# Patient Record
Sex: Female | Born: 1995
Health system: Southern US, Community
[De-identification: ages and names within clinical notes are randomized; demographics above are authoritative.]

## PROBLEM LIST (undated history)

## (undated) DIAGNOSIS — J45909 Unspecified asthma, uncomplicated: Secondary | ICD-10-CM

## (undated) DIAGNOSIS — F32A Depression, unspecified: Secondary | ICD-10-CM

## (undated) DIAGNOSIS — F419 Anxiety disorder, unspecified: Secondary | ICD-10-CM

## (undated) DIAGNOSIS — M549 Dorsalgia, unspecified: Secondary | ICD-10-CM

## (undated) DIAGNOSIS — E079 Disorder of thyroid, unspecified: Secondary | ICD-10-CM

## (undated) DIAGNOSIS — E890 Postprocedural hypothyroidism: Secondary | ICD-10-CM

## (undated) DIAGNOSIS — F410 Panic disorder [episodic paroxysmal anxiety] without agoraphobia: Secondary | ICD-10-CM

## (undated) HISTORY — DX: Depression, unspecified: F32.A

## (undated) HISTORY — DX: Postprocedural hypothyroidism: E89.0

## (undated) HISTORY — PX: THYROIDECTOMY: SHX17

## (undated) HISTORY — DX: Anxiety disorder, unspecified: F41.9

---
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
## 2015-05-06 ENCOUNTER — Encounter (HOSPITAL_BASED_OUTPATIENT_CLINIC_OR_DEPARTMENT_OTHER): Payer: Self-pay | Admitting: Emergency Medicine

## 2015-05-06 ENCOUNTER — Emergency Department (HOSPITAL_BASED_OUTPATIENT_CLINIC_OR_DEPARTMENT_OTHER)
Admission: EM | Admit: 2015-05-06 | Discharge: 2015-05-06 | Disposition: A | Discharge status: Home / Self Care | Payer: Medicaid Other | Attending: Physician Assistant | Admitting: Physician Assistant

## 2015-05-06 ENCOUNTER — Emergency Department (HOSPITAL_BASED_OUTPATIENT_CLINIC_OR_DEPARTMENT_OTHER): Payer: Medicaid Other

## 2015-05-06 DIAGNOSIS — Y9289 Other specified places as the place of occurrence of the external cause: Secondary | ICD-10-CM | POA: Diagnosis not present

## 2015-05-06 DIAGNOSIS — Z8659 Personal history of other mental and behavioral disorders: Secondary | ICD-10-CM | POA: Diagnosis not present

## 2015-05-06 DIAGNOSIS — Z792 Long term (current) use of antibiotics: Secondary | ICD-10-CM | POA: Diagnosis not present

## 2015-05-06 DIAGNOSIS — W010XXA Fall on same level from slipping, tripping and stumbling without subsequent striking against object, initial encounter: Secondary | ICD-10-CM | POA: Insufficient documentation

## 2015-05-06 DIAGNOSIS — Y9389 Activity, other specified: Secondary | ICD-10-CM | POA: Insufficient documentation

## 2015-05-06 DIAGNOSIS — Y998 Other external cause status: Secondary | ICD-10-CM | POA: Diagnosis not present

## 2015-05-06 DIAGNOSIS — M25561 Pain in right knee: Secondary | ICD-10-CM

## 2015-05-06 DIAGNOSIS — Z7951 Long term (current) use of inhaled steroids: Secondary | ICD-10-CM | POA: Diagnosis not present

## 2015-05-06 DIAGNOSIS — J45909 Unspecified asthma, uncomplicated: Secondary | ICD-10-CM | POA: Insufficient documentation

## 2015-05-06 DIAGNOSIS — S8991XA Unspecified injury of right lower leg, initial encounter: Secondary | ICD-10-CM | POA: Insufficient documentation

## 2015-05-06 DIAGNOSIS — Z79899 Other long term (current) drug therapy: Secondary | ICD-10-CM | POA: Insufficient documentation

## 2015-05-06 MED ORDER — IBUPROFEN 800 MG PO TABS
800.0000 mg | ORAL_TABLET | Freq: Once | ORAL | Status: AC
Start: 2015-05-06 — End: 2015-05-06
  Administered 2015-05-06: 800 mg via ORAL
  Filled 2015-05-06: qty 1

## 2015-05-06 MED ORDER — IBUPROFEN 800 MG PO TABS: 800.0000 mg | ORAL_TABLET | Freq: Three times a day (TID) | ORAL | Status: DC

## 2015-05-06 NOTE — ED Notes (Signed)
Patient in Xray

## 2015-05-06 NOTE — ED Provider Notes (Signed)
CSN: [id number]     Arrival date & time 05/06/15  2036 History  By signing my name below, I, Evelene Croon, attest that this documentation has been prepared under the direction and in the presence of Diago Haik Julio Alm, MD . Electronically Signed: Evelene Croon, Scribe. 05/06/2015. 9:34 PM.    Chief Complaint  Patient presents with  . Knee Pain    The history is provided by the patient. No language interpreter was used.     HPI Comments:  Neera Dogan is a 20 y.o. female who presents to the Emergency Department complaining of moderate constant right knee pain that began ~ 1 hour PTA s/p injury. She states she slipped into a split and fell onto the knee and also twisted the knee. She reports associated swelling at the site.  Her pain is exacerbated with movement. No alleviating factors noted.  Past Medical History  Diagnosis Date  . Asthma   . Panic attacks    Past Surgical History  Procedure Laterality Date  . Thyroidectomy     History reviewed. No pertinent family history. Social History  Substance Use Topics  . Smoking status: Never Smoker   . Smokeless tobacco: None  . Alcohol Use: No   OB History    No data available     Review of Systems  10 systems reviewed and all are negative for acute change except as noted in the HPI.  Allergies  Review of patient's allergies indicates no known allergies.  Home Medications   Prior to Admission medications   Medication Sig Start Date End Date Taking? Authorizing Provider  albuterol (PROVENTIL HFA;VENTOLIN HFA) 108 (90 BASE) MCG/ACT inhaler Inhale 1 puff into the lungs every 6 (six) hours as needed for wheezing or shortness of breath.    Historical Provider, MD  cetirizine-pseudoephedrine (ZYRTEC-D) 5-120 MG per tablet Take 1 tablet by mouth 2 (two) times daily. 07/11/14   April Palumbo, MD  Cholecalciferol (VITAMIN D PO) Take 1 tablet by mouth daily.    Historical Provider, MD  fluticasone (FLONASE) 50 MCG/ACT nasal  spray Place 2 sprays into both nostrils daily. 07/11/14   April Palumbo, MD  levothyroxine (SYNTHROID, LEVOTHROID) 137 MCG tablet Take 137 mcg by mouth daily before breakfast.    Historical Provider, MD  moxifloxacin (VIGAMOX) 0.5 % ophthalmic solution Place 1 drop into both eyes 3 (three) times daily. 07/11/14   April Palumbo, MD  nortriptyline (PAMELOR) 25 MG capsule Take 25 mg by mouth at bedtime.    Historical Provider, MD   BP 125/80 mmHg  Pulse 73  Temp(Src) 100 F (37.8 C) (Oral)  Resp 18  Ht 6' (1.829 m)  Wt 275 lb (124.739 kg)  BMI 37.29 kg/m2  SpO2 100% Physical Exam  Constitutional: She is oriented to person, place, and time. She appears well-developed and well-nourished. No distress.  HENT:  Head: Normocephalic and atraumatic.  Eyes: Conjunctivae are normal.  Cardiovascular: Normal rate.   Pulses:      Dorsalis pedis pulses are 2+ on the right side, and 2+ on the left side.  Pulmonary/Chest: Effort normal.  Abdominal: She exhibits no distension.  Musculoskeletal: She exhibits no edema.  No swelling noted to right knee.  Negative anterior/posterior drawer test Pt able to tolerate walking  Neurological: She is alert and oriented to person, place, and time.  Skin: Skin is warm and dry.  Psychiatric: She has a normal mood and affect.  Nursing note and vitals reviewed.   ED Course  Procedures  DIAGNOSTIC STUDIES:  Oxygen Saturation is 100% on RA, normal by my interpretation.    COORDINATION OF CARE:  9:28 PM Pt updated with XR results. Discussed treatment plan with pt at bedside and pt agreed to plan.  Imaging Review Dg Knee Complete 4 Views Right  05/06/2015  CLINICAL DATA:  Right knee pain after slipping on floor and twisting leg 20 minutes ago. Swelling and redness to right knee area. EXAM: RIGHT KNEE - COMPLETE 4+ VIEW COMPARISON:  Radiographs 08/18/2012 FINDINGS: No fracture or dislocation. The alignment and joint spaces are maintained. Growth plates have near  completely fused. No joint effusion or focal soft tissue abnormality. IMPRESSION: Negative radiographs of the right knee. Electronically Signed   By: Jeb Levering M.D.   On: 05/06/2015 21:15   I have personally reviewed and evaluated these images as part of my medical decision-making.    MDM   Final diagnoses:  None   patient a pleasant 20 year old female presenting with knee pain. Patient reports twisting her knee earlier today prior to arrival.  No external signs of trauma. Minimal swelling. Patient has tenderness diffusely. No focal tenderness or laxity.  We'll give ibuprofen, Ace bandage, crutches and have her follow-up with orthopedics if not improved.  I personally performed the services described in this documentation, which was scribed in my presence. The recorded information has been reviewed and is accurate.     Tashera Montalvo Julio Alm, MD 05/06/15 2241

## 2015-05-06 NOTE — ED Notes (Signed)
Pt in c/o R knee pain after slipping on floor and twisting leg. Knee is noted to be swelling and warm to touch, incident approx 20 min PTA.

## 2015-10-19 ENCOUNTER — Encounter (HOSPITAL_BASED_OUTPATIENT_CLINIC_OR_DEPARTMENT_OTHER): Payer: Self-pay

## 2015-10-19 ENCOUNTER — Emergency Department (HOSPITAL_BASED_OUTPATIENT_CLINIC_OR_DEPARTMENT_OTHER)
Admission: EM | Admit: 2015-10-19 | Discharge: 2015-10-19 | Disposition: A | Discharge status: Home / Self Care | Payer: Medicaid Other | Attending: Emergency Medicine | Admitting: Emergency Medicine

## 2015-10-19 ENCOUNTER — Emergency Department (HOSPITAL_BASED_OUTPATIENT_CLINIC_OR_DEPARTMENT_OTHER): Payer: Medicaid Other

## 2015-10-19 DIAGNOSIS — J45909 Unspecified asthma, uncomplicated: Secondary | ICD-10-CM | POA: Diagnosis not present

## 2015-10-19 DIAGNOSIS — Y9301 Activity, walking, marching and hiking: Secondary | ICD-10-CM | POA: Diagnosis not present

## 2015-10-19 DIAGNOSIS — W228XXA Striking against or struck by other objects, initial encounter: Secondary | ICD-10-CM | POA: Insufficient documentation

## 2015-10-19 DIAGNOSIS — Y929 Unspecified place or not applicable: Secondary | ICD-10-CM | POA: Diagnosis not present

## 2015-10-19 DIAGNOSIS — Z79899 Other long term (current) drug therapy: Secondary | ICD-10-CM | POA: Diagnosis not present

## 2015-10-19 DIAGNOSIS — Z791 Long term (current) use of non-steroidal anti-inflammatories (NSAID): Secondary | ICD-10-CM | POA: Diagnosis not present

## 2015-10-19 DIAGNOSIS — Y999 Unspecified external cause status: Secondary | ICD-10-CM | POA: Diagnosis not present

## 2015-10-19 DIAGNOSIS — S91331A Puncture wound without foreign body, right foot, initial encounter: Secondary | ICD-10-CM | POA: Insufficient documentation

## 2015-10-19 DIAGNOSIS — T148XXA Other injury of unspecified body region, initial encounter: Secondary | ICD-10-CM

## 2015-10-19 HISTORY — DX: Disorder of thyroid, unspecified: E07.9

## 2015-10-19 MED ORDER — NAPROXEN 500 MG PO TABS: 500.0000 mg | ORAL_TABLET | Freq: Two times a day (BID) | ORAL | Status: DC

## 2015-10-19 MED ORDER — TETANUS-DIPHTH-ACELL PERTUSSIS 5-2.5-18.5 LF-MCG/0.5 IM SUSP
0.5000 mL | Freq: Once | INTRAMUSCULAR | Status: AC
  Administered 2015-10-19: 0.5 mL via INTRAMUSCULAR
  Filled 2015-10-19: qty 0.5

## 2015-10-19 MED ORDER — CEPHALEXIN 500 MG PO CAPS: 500.0000 mg | ORAL_CAPSULE | Freq: Four times a day (QID) | ORAL | Status: DC

## 2015-10-19 NOTE — Discharge Instructions (Signed)
You have been seen today for a puncture wound. Your imaging showed no abnormalities. Please take all of your antibiotics until finished!   You may develop abdominal discomfort or diarrhea from the antibiotic.  You may help offset this with probiotics which you can buy or get in yogurt. Do not eat or take the probiotics until 2 hours after your antibiotic. Follow up with PCP should symptoms fail to resolve. Return to ED or go to your PCP should symptoms worsen.

## 2015-10-19 NOTE — ED Provider Notes (Signed)
CSN: [id number]     Arrival date & time 10/19/15  2117 History  By signing my name below, I, Gwenlyn Fudge, attest that this documentation has been prepared under the direction and in the presence of Shawn Joy, PA-C. Electronically Signed: Gwenlyn Fudge, ED Scribe. 10/19/2015. 10:23 PM.   Chief Complaint  Patient presents with  . Puncture Wound   The history is provided by the patient. No language interpreter was used.    HPI Comments: Sherry Schmidt is a 20 y.o. female who presents to the Emergency Department complaining of a 2 puncture wounds to the right foot obtained this evening. Pt reports 3/10 pain. Pt states she was walking barefoot outside when she stepped on 2 nails at the same time. She states the nails were not broken and were intact when she saw them on the ground after the incident. The nails did not stick in the wound. Pt reports that both wounds did bleed, but stopped with pressure. Pt reports out of date Tetanus shot. Bleeding is controlled without bandage or pressure. She reports she does have a PCP. Denies neuro deficits, other injuries, or any other complaints.  Past Medical History  Diagnosis Date  . Asthma   . Panic attacks   . Thyroid disease    Past Surgical History  Procedure Laterality Date  . Thyroidectomy     No family history on file. Social History  Substance Use Topics  . Smoking status: Never Smoker   . Smokeless tobacco: None  . Alcohol Use: No   OB History    No data available     Review of Systems  Skin: Positive for wound.  Neurological: Negative for weakness and numbness.    Allergies  Review of patient's allergies indicates no known allergies.  Home Medications   Prior to Admission medications   Medication Sig Start Date End Date Taking? Authorizing Provider  albuterol (PROVENTIL HFA;VENTOLIN HFA) 108 (90 BASE) MCG/ACT inhaler Inhale 1 puff into the lungs every 6 (six) hours as needed for wheezing or shortness of breath.   Yes  Historical Provider, MD  cetirizine-pseudoephedrine (ZYRTEC-D) 5-120 MG per tablet Take 1 tablet by mouth 2 (two) times daily. 07/11/14  Yes April Palumbo, MD  Cholecalciferol (VITAMIN D PO) Take 1 tablet by mouth daily.   Yes Historical Provider, MD  fluticasone (FLONASE) 50 MCG/ACT nasal spray Place 2 sprays into both nostrils daily. 07/11/14  Yes April Palumbo, MD  ibuprofen (ADVIL,MOTRIN) 800 MG tablet Take 1 tablet (800 mg total) by mouth 3 (three) times daily. 05/06/15  Yes Courteney Lyn Mackuen, MD  levothyroxine (SYNTHROID, LEVOTHROID) 137 MCG tablet Take 137 mcg by mouth daily before breakfast.   Yes Historical Provider, MD  cephALEXin (KEFLEX) 500 MG capsule Take 1 capsule (500 mg total) by mouth 4 (four) times daily. 10/19/15   Shawn C Joy, PA-C  moxifloxacin (VIGAMOX) 0.5 % ophthalmic solution Place 1 drop into both eyes 3 (three) times daily. Patient not taking: Reported on 10/19/2015 07/11/14   April Palumbo, MD  naproxen (NAPROSYN) 500 MG tablet Take 1 tablet (500 mg total) by mouth 2 (two) times daily. 10/19/15   Shawn C Joy, PA-C  nortriptyline (PAMELOR) 25 MG capsule Take 25 mg by mouth at bedtime.    Historical Provider, MD   BP 101/68 mmHg  Pulse 92  Temp(Src) 98.2 F (36.8 C) (Oral)  Resp 18  Ht 6\' 1"  (1.854 m)  Wt 248 lb (112.492 kg)  BMI 32.73 kg/m2  SpO2 100%  LMP 09/22/2015 Physical Exam  Constitutional: She appears well-developed and well-nourished. No distress.  HENT:  Head: Normocephalic and atraumatic.  Eyes: Conjunctivae are normal.  Neck: Neck supple.  Cardiovascular: Normal rate, regular rhythm and intact distal pulses.   Pulmonary/Chest: Effort normal. No respiratory distress.  Musculoskeletal: Normal range of motion. She exhibits tenderness.  Neurological: She is alert.  Sensation intact in the right foot and toes. Strength is 5 out of 5.  Skin: Skin is warm and dry. She is not diaphoretic.  2 areas of tenderness and minor swelling to the plantar surface of  the right foot. Questionable puncture wounds. One is just proximal to the great toe The other is near the calcaneous  No active hemorrhage or exudate No sign of retained foreign body  Psychiatric: She has a normal mood and affect. Her behavior is normal.  Nursing note and vitals reviewed.   ED Course  Procedures (including critical care time)  DIAGNOSTIC STUDIES: Oxygen Saturation is 100% on RA, normal by my interpretation.    COORDINATION OF CARE: 10:16 PM Discussed treatment plan with pt at bedside which includes Tdap Injection and pt agreed to plan.  Imaging Review Dg Foot Complete Right  10/19/2015  CLINICAL DATA:  Puncture wounds to the right foot. EXAM: RIGHT FOOT COMPLETE - 3+ VIEW COMPARISON:  None. FINDINGS: There is no evidence of fracture or dislocation. There is no evidence of arthropathy or other focal bone abnormality. Soft tissues are unremarkable. No radiopaque foreign bodies are seen. IMPRESSION: Negative. Electronically Signed   By: Fidela Salisbury M.D.   On: 10/19/2015 21:53   I have personally reviewed and evaluated these images as part of my medical decision-making.   EKG Interpretation None      MDM   Final diagnoses:  Puncture wound   [name] presents with 2 puncture wounds from a nail that occurred just prior to arrival.  No retained foreign bodies on x-ray. Antibiotic prescribed. Patient to follow up with PCP for wound check. The patient was given instructions for home care as well as return precautions. Patient voices understanding of these instructions, accepts the plan, and is comfortable with discharge.  Filed Vitals:   10/19/15 2123 10/19/15 2253  BP: 101/68 120/76  Pulse: 92 84  Temp: 98.2 F (36.8 C)   TempSrc: Oral   Resp: 18 18  Height: 6\' 1"  (1.854 m)   Weight: 112.492 kg   SpO2: 100% 99%     I personally performed the services described in this documentation, which was scribed in my presence. The recorded  information has been reviewed and is accurate.    Lorayne Bender, PA-C 10/20/15 [place], MD 10/23/15 1911

## 2015-10-19 NOTE — ED Notes (Signed)
Pt is on an abx that begins with a "c" for an ear infection, is unsure of which one and pharmacy is closed

## 2015-10-19 NOTE — ED Notes (Signed)
Pt stepped on two nails while walking this afternoon on right foot, no bleeding, slight swelling and tenderness

## 2016-04-11 ENCOUNTER — Emergency Department (HOSPITAL_BASED_OUTPATIENT_CLINIC_OR_DEPARTMENT_OTHER)
Admission: EM | Admit: 2016-04-11 | Discharge: 2016-04-11 | Disposition: A | Discharge status: Home / Self Care | Payer: Medicaid Other | Attending: Emergency Medicine | Admitting: Emergency Medicine

## 2016-04-11 ENCOUNTER — Encounter (HOSPITAL_BASED_OUTPATIENT_CLINIC_OR_DEPARTMENT_OTHER): Payer: Self-pay | Admitting: *Deleted

## 2016-04-11 DIAGNOSIS — J45909 Unspecified asthma, uncomplicated: Secondary | ICD-10-CM | POA: Diagnosis not present

## 2016-04-11 DIAGNOSIS — M545 Low back pain, unspecified: Secondary | ICD-10-CM

## 2016-04-11 LAB — PREGNANCY, URINE: Preg Test, Ur: NEGATIVE

## 2016-04-11 LAB — URINALYSIS, ROUTINE W REFLEX MICROSCOPIC
Glucose, UA: NEGATIVE mg/dL
KETONES UR: NEGATIVE mg/dL
LEUKOCYTES UA: NEGATIVE
Nitrite: NEGATIVE
Protein, ur: NEGATIVE mg/dL
Specific Gravity, Urine: 1.025 (ref 1.005–1.030)
pH: 5.5 (ref 5.0–8.0)

## 2016-04-11 LAB — URINALYSIS, MICROSCOPIC (REFLEX)

## 2016-04-11 MED ORDER — IBUPROFEN 800 MG PO TABS: 800.0000 mg | ORAL_TABLET | Freq: Three times a day (TID) | ORAL | 0 refills | Status: DC | PRN

## 2016-04-11 MED ORDER — IBUPROFEN 800 MG PO TABS
800.0000 mg | ORAL_TABLET | Freq: Once | ORAL | Status: AC
  Administered 2016-04-11: 800 mg via ORAL
  Filled 2016-04-11: qty 1

## 2016-04-11 MED ORDER — METHOCARBAMOL 500 MG PO TABS
500.0000 mg | ORAL_TABLET | Freq: Once | ORAL | Status: AC
  Administered 2016-04-11: 500 mg via ORAL
  Filled 2016-04-11: qty 1

## 2016-04-11 MED ORDER — METHOCARBAMOL 500 MG PO TABS: 500.0000 mg | ORAL_TABLET | Freq: Three times a day (TID) | ORAL | 0 refills | Status: DC | PRN

## 2016-04-11 NOTE — ED Provider Notes (Signed)
TIME SEEN: 3:10 AM  CHIEF COMPLAINT: Left lower back pain  HPI: Pt is a 21 y.o. female who presents emergency department with left lower back pain that started 8 hours ago. States it started when she was driving in a car. States she felt her lower back "give out". No known injury to her back. States pain is worse with palpation and movement. No history of kidney stones. No history of back surgery or epidural injections. Denies numbness, tingling or focal weakness. Has had some mild dysuria but no hematuria, urinary frequency or urgency. No nausea, vomiting or diarrhea. No fever. No bowel or bladder incontinence, urinary retention.  ROS: See HPI Constitutional: no fever  Eyes: no drainage  ENT: no runny nose   Cardiovascular:  no chest pain  Resp: no SOB  GI: no vomiting GU: no dysuria Integumentary: no rash  Allergy: no hives  Musculoskeletal: no leg swelling  Neurological: no slurred speech ROS otherwise negative  PAST MEDICAL HISTORY/PAST SURGICAL HISTORY:  Past Medical History:  Diagnosis Date  . Asthma   . Panic attacks   . Thyroid disease     MEDICATIONS:  Prior to Admission medications   Medication Sig Start Date End Date Taking? Authorizing Provider  albuterol (PROVENTIL HFA;VENTOLIN HFA) 108 (90 BASE) MCG/ACT inhaler Inhale 1 puff into the lungs every 6 (six) hours as needed for wheezing or shortness of breath.    Historical Provider, MD  cephALEXin (KEFLEX) 500 MG capsule Take 1 capsule (500 mg total) by mouth 4 (four) times daily. 10/19/15   Shawn C Joy, PA-C  cetirizine-pseudoephedrine (ZYRTEC-D) 5-120 MG per tablet Take 1 tablet by mouth 2 (two) times daily. 07/11/14   April Palumbo, MD  Cholecalciferol (VITAMIN D PO) Take 1 tablet by mouth daily.    Historical Provider, MD  fluticasone (FLONASE) 50 MCG/ACT nasal spray Place 2 sprays into both nostrils daily. 07/11/14   April Palumbo, MD  ibuprofen (ADVIL,MOTRIN) 800 MG tablet Take 1 tablet (800 mg total) by mouth 3  (three) times daily. 05/06/15   Courteney Lyn Mackuen, MD  levothyroxine (SYNTHROID, LEVOTHROID) 137 MCG tablet Take 137 mcg by mouth daily before breakfast.    Historical Provider, MD  moxifloxacin (VIGAMOX) 0.5 % ophthalmic solution Place 1 drop into both eyes 3 (three) times daily. Patient not taking: Reported on 10/19/2015 07/11/14   April Palumbo, MD  naproxen (NAPROSYN) 500 MG tablet Take 1 tablet (500 mg total) by mouth 2 (two) times daily. 10/19/15   Shawn C Joy, PA-C  nortriptyline (PAMELOR) 25 MG capsule Take 25 mg by mouth at bedtime.    Historical Provider, MD    ALLERGIES:  No Known Allergies  SOCIAL HISTORY:  Social History  Substance Use Topics  . Smoking status: Never Smoker  . Smokeless tobacco: Not on file  . Alcohol use No    FAMILY HISTORY: No family history on file.  EXAM: BP 116/73 (BP Location: Right Arm)   Pulse 74   Temp 97.9 F (36.6 C) (Oral)   Resp 18   SpO2 100%  CONSTITUTIONAL: Alert and oriented and responds appropriately to questions. Well-appearing; well-nourished, Afebrile, nontoxic, well-hydrated, smiling HEAD: Normocephalic EYES: Conjunctivae clear, PERRL, EOMI ENT: normal nose; no rhinorrhea; moist mucous membranes NECK: Supple, no meningismus, no nuchal rigidity, no LAD  CARD: RRR; S1 and S2 appreciated; no murmurs, no clicks, no rubs, no gallops RESP: Normal chest excursion without splinting or tachypnea; breath sounds clear and equal bilaterally; no wheezes, no rhonchi, no rales, no hypoxia  or respiratory distress, speaking full sentences ABD/GI: Normal bowel sounds; non-distended; soft, non-tender, no rebound, no guarding, no peritoneal signs, no hepatosplenomegaly BACK:  The back appears normal and is tender over the left lower lumbar paraspinal musculature without midline spinal tenderness or step-off or deformity,, there is no CVA tenderness EXT: Normal ROM in all joints; non-tender to palpation; no edema; normal capillary refill; no  cyanosis, no calf tenderness or swelling    SKIN: Normal color for age and race; warm; no rash NEURO: Moves all extremities equally, sensation to light touch intact diffusely, cranial nerves II through XII intact, normal speech, Normal gait, no saddle anesthesia PSYCH: The patient's mood and manner are appropriate. Grooming and personal hygiene are appropriate.  MEDICAL DECISION MAKING: Patient here with what seems to be musculoskeletal back pain. Doubt kidney stone as I am able to reproduce pain with palpation and with movement. Normal neurologic exam. Nothing to suggest cauda equina, epidural abscess or hematoma, discitis, transverse myelitis. No injury to suggest fracture. I do not feel she needs emergent imaging at this time. Urine shows hemoglobin but only 0-5 red blood cells. Also many bacteria and many squamous cells. Suspect dirty catch. Pregnancy test negative.  Nothing to suggest pyelonephritis, UTI and again low suspicion for kidney stone.  Patient given ibuprofen and Robaxin for pain control.  Patient reports feeling much better and pain is now just a "dull ache". I feel she is safe to be discharged home with prescriptions for ibuprofen, Robaxin. Recommended using heat to this area, stretching. Recommended no heavy lifting, standing for long periods of time. Discussed at length return precautions. She verbalizes understanding and is comfortable with this plan.   At this time, I do not feel there is any life-threatening condition present. I have reviewed and discussed all results (EKG, imaging, lab, urine as appropriate) and exam findings with patient/family. I have reviewed nursing notes and appropriate previous records.  I feel the patient is safe to be discharged home without further emergent workup and can continue workup as an outpatient as needed. Discussed usual and customary return precautions. Patient/family verbalize understanding and are comfortable with this plan.  Outpatient follow-up  has been provided. All questions have been answered.    [place], DO 04/11/16 [phone]

## 2016-04-11 NOTE — Discharge Instructions (Signed)
To find a primary care or specialty doctor please call [phone] or [phone] to access "[facility] Find a Doctor Service." ° °You may also go on the [facility] website at [email or web address] ° °There are also multiple [facility], [facility], [facility] and [facility] practices throughout the [place] that are frequently accepting new patients. You may find a clinic that is close to your home and contact them. ° ° and Well[facility] -  °[address] °[place] [address] °[phone] ° ° °[facility] -  °[address] °[place] [address] [phone] ° ° °[facility] - °[phone]  °[place] [address] °[phone] ° ° °

## 2016-04-11 NOTE — ED Triage Notes (Signed)
Pt with left flank pain x 8 hours constant in nature. Denies N/V or painful urination, does report urinary frequency

## 2016-10-16 DIAGNOSIS — E890 Postprocedural hypothyroidism: Secondary | ICD-10-CM | POA: Insufficient documentation

## 2016-11-06 DIAGNOSIS — R5383 Other fatigue: Secondary | ICD-10-CM | POA: Insufficient documentation

## 2017-02-05 ENCOUNTER — Encounter (HOSPITAL_BASED_OUTPATIENT_CLINIC_OR_DEPARTMENT_OTHER): Payer: Self-pay

## 2017-02-05 ENCOUNTER — Other Ambulatory Visit: Payer: Self-pay

## 2017-02-05 ENCOUNTER — Emergency Department (HOSPITAL_BASED_OUTPATIENT_CLINIC_OR_DEPARTMENT_OTHER)
Admission: EM | Admit: 2017-02-05 | Discharge: 2017-02-05 | Disposition: A | Discharge status: Home / Self Care | Payer: Medicaid Other | Attending: Emergency Medicine | Admitting: Emergency Medicine

## 2017-02-05 DIAGNOSIS — H6690 Otitis media, unspecified, unspecified ear: Secondary | ICD-10-CM

## 2017-02-05 DIAGNOSIS — Z79899 Other long term (current) drug therapy: Secondary | ICD-10-CM | POA: Insufficient documentation

## 2017-02-05 DIAGNOSIS — H6692 Otitis media, unspecified, left ear: Secondary | ICD-10-CM | POA: Insufficient documentation

## 2017-02-05 DIAGNOSIS — B070 Plantar wart: Secondary | ICD-10-CM | POA: Diagnosis not present

## 2017-02-05 DIAGNOSIS — J45909 Unspecified asthma, uncomplicated: Secondary | ICD-10-CM | POA: Insufficient documentation

## 2017-02-05 DIAGNOSIS — H9202 Otalgia, left ear: Secondary | ICD-10-CM | POA: Diagnosis present

## 2017-02-05 MED ORDER — AMOXICILLIN 500 MG PO CAPS: 500.0000 mg | ORAL_CAPSULE | Freq: Two times a day (BID) | ORAL | 0 refills | Status: DC

## 2017-02-05 NOTE — ED Triage Notes (Signed)
C/o left earache x 1 week-plantar warts to right foot x 5 months-pt NAD-steady gait

## 2017-02-05 NOTE — Discharge Instructions (Signed)
Have given you follow-up to podiatry for your plantar warts.  Have given you antibiotics for your ear infection.  Follow-up with your primary care doctor.  Return to ED as needed.

## 2017-02-06 NOTE — ED Provider Notes (Signed)
[name] Provider Note   CSN: [id number] Arrival date & time: 02/05/17  [name]     History   Chief Complaint Chief Complaint  Patient presents with  . Otalgia  . Plantar Warts    HPI Sherry Schmidt is a 21 y.o. female.  HPI 21 year old Caucasian female presents to the ED with no pertinent past medical history for evaluation of left ear pain and plantar warts.  Patient states that she developed left ear pain that started 5 days ago.  States that it is not relieved by any over-the-counter medications.  Nothing makes better or worse.  He denies any recent swimming.  Does report some nasal congestion and rhinorrhea.  Denies any associated sore throat, cough, fevers, chills.  Patient reports history of frequent ear infections when she was younger.  Patient also complains of plantar warts to her right foot.  States they have been there for several months.  She has seen her primary care doctor in the past.  States that she has been using over-the-counter remedies with no relief.  States that they have become painful.  Denies any associated purulent drainage, erythema, edema.  States that it is difficult to walk due to the pain.  She has not taken anything for the pain prior to arrival.  Palpation makes the pain worse. Past Medical History:  Diagnosis Date  . Asthma   . Panic attacks   . Thyroid disease     There are no active problems to display for this patient.   Past Surgical History:  Procedure Laterality Date  . THYROIDECTOMY      OB History    No data available       Home Medications    Prior to Admission medications   Medication Sig Start Date End Date Taking? Authorizing Provider  amoxicillin (AMOXIL) 500 MG capsule Take 1 capsule (500 mg total) 2 (two) times daily by mouth. 02/05/17   Doristine Devoid, PA-C  levothyroxine (SYNTHROID, LEVOTHROID) 137 MCG tablet Take 200 mcg by mouth daily before breakfast.     [provider]    Family History No family history on file.  Social History Social History   Tobacco Use  . Smoking status: Never Smoker  . Smokeless tobacco: Never Used  Substance Use Topics  . Alcohol use: No  . Drug use: No     Allergies   Patient has no known allergies.   Review of Systems Review of Systems  Constitutional: Negative for chills and fever.  HENT: Positive for congestion, ear pain and rhinorrhea. Negative for ear discharge and sore throat.   Respiratory: Negative for cough.   Gastrointestinal: Negative for vomiting.  Musculoskeletal: Positive for myalgias.  Skin: Positive for wound.  Neurological: Negative for weakness and numbness.     Physical Exam Updated Vital Signs BP 128/90 (BP Location: Left Arm)   Pulse 92   Temp 98.6 F (37 C) (Oral)   Resp 18   Ht 6\' 1"  (1.854 m)   Wt 131.5 kg (290 lb)   LMP  (LMP Unknown)   SpO2 99%   BMI 38.26 kg/m   Physical Exam  Constitutional: She appears well-developed and well-nourished. No distress.  HENT:  Head: Normocephalic and atraumatic.  Right Ear: External ear and ear canal normal. Tympanic membrane is not erythematous, not retracted and not bulging. No middle ear effusion.  Left Ear: External ear and ear canal normal. Tympanic membrane is erythematous. Tympanic membrane is not retracted and  not bulging.  No middle ear effusion.  Nose: Mucosal edema and rhinorrhea present.  Mouth/Throat: Uvula is midline, oropharynx is clear and moist and mucous membranes are normal.  Eyes: Right eye exhibits no discharge. Left eye exhibits no discharge. No scleral icterus.  Neck: Normal range of motion. Neck supple.  Pulmonary/Chest: No respiratory distress.  Musculoskeletal: Normal range of motion.  Lymphadenopathy:    She has no cervical adenopathy.  Neurological: She is alert.  Skin: Skin is warm and dry. Capillary refill takes less than 2 seconds. No pallor.  Plantar warts noted to the right foot  specifically 3 of them that have callused over..  They are mildly tender to touch.  No surrounding erythema, purulent drainage.  Psychiatric: Her behavior is normal. Judgment and thought content normal.  Nursing note and vitals reviewed.    ED Treatments / Results  Labs (all labs ordered are listed, but only abnormal results are displayed) Labs Reviewed - No data to display  EKG  EKG Interpretation None       Radiology No results found.  Procedures Procedures (including critical care time)  Medications Ordered in ED Medications - No data to display   Initial Impression / Assessment and Plan / ED Course  I have reviewed the triage vital signs and the nursing notes.  Pertinent labs & imaging results that were available during my care of the patient were reviewed by me and considered in my medical decision making (see chart for details).     Patient presents to the ED for multiple complaints including left ear pain and plantar warts to the right foot.  Patient is overall well-appearing and nontoxic.  Vital signs reassuring.  Patient is afebrile.  There is not appear to be any significant signs of otitis media however given the length of patient's ear pain will prescribe patient amoxicillin.  Lesion on her right foot are consistent with plantar warts.  No signs of overlying cellulitis.  Encouraged over-the-counter remedies and follow-up with podiatry.  Pt is hemodynamically stable, in NAD, & able to ambulate in the ED. Evaluation does not show pathology that would require ongoing emergent intervention or inpatient treatment. I explained the diagnosis to the patient. Pain has been managed & has no complaints prior to dc. Pt is comfortable with above plan and is stable for discharge at this time. All questions were answered prior to disposition. Strict return precautions for f/u to the ED were discussed. Encouraged follow up with PCP.   Final Clinical Impressions(s) / ED Diagnoses     Final diagnoses:  Acute otitis media, unspecified otitis media type  Plantar warts    ED Discharge Orders        Ordered    amoxicillin (AMOXIL) 500 MG capsule  2 times daily,   Status:  Discontinued     02/05/17 2131    amoxicillin (AMOXIL) 500 MG capsule  2 times daily     02/05/17 2135       Doristine Devoid, PA-C 02/06/17 0155    Deno Etienne, DO 02/06/17 1650

## 2017-10-13 ENCOUNTER — Emergency Department (HOSPITAL_BASED_OUTPATIENT_CLINIC_OR_DEPARTMENT_OTHER)
Admission: EM | Admit: 2017-10-13 | Discharge: 2017-10-13 | Disposition: A | Discharge status: Home / Self Care | Payer: Self-pay | Attending: Emergency Medicine | Admitting: Emergency Medicine

## 2017-10-13 ENCOUNTER — Emergency Department (HOSPITAL_BASED_OUTPATIENT_CLINIC_OR_DEPARTMENT_OTHER): Payer: No Typology Code available for payment source

## 2017-10-13 ENCOUNTER — Other Ambulatory Visit: Payer: Self-pay

## 2017-10-13 ENCOUNTER — Encounter (HOSPITAL_BASED_OUTPATIENT_CLINIC_OR_DEPARTMENT_OTHER): Payer: Self-pay | Admitting: *Deleted

## 2017-10-13 DIAGNOSIS — Z79899 Other long term (current) drug therapy: Secondary | ICD-10-CM | POA: Insufficient documentation

## 2017-10-13 DIAGNOSIS — J45909 Unspecified asthma, uncomplicated: Secondary | ICD-10-CM | POA: Insufficient documentation

## 2017-10-13 DIAGNOSIS — Y9389 Activity, other specified: Secondary | ICD-10-CM | POA: Insufficient documentation

## 2017-10-13 DIAGNOSIS — M549 Dorsalgia, unspecified: Secondary | ICD-10-CM

## 2017-10-13 DIAGNOSIS — Y9289 Other specified places as the place of occurrence of the external cause: Secondary | ICD-10-CM | POA: Insufficient documentation

## 2017-10-13 DIAGNOSIS — Y990 Civilian activity done for income or pay: Secondary | ICD-10-CM | POA: Insufficient documentation

## 2017-10-13 DIAGNOSIS — W010XXA Fall on same level from slipping, tripping and stumbling without subsequent striking against object, initial encounter: Secondary | ICD-10-CM | POA: Insufficient documentation

## 2017-10-13 DIAGNOSIS — M5442 Lumbago with sciatica, left side: Secondary | ICD-10-CM | POA: Insufficient documentation

## 2017-10-13 LAB — PREGNANCY, URINE: PREG TEST UR: NEGATIVE

## 2017-10-13 MED ORDER — NAPROXEN 500 MG PO TABS: 500.0000 mg | ORAL_TABLET | Freq: Two times a day (BID) | ORAL | 0 refills | Status: DC

## 2017-10-13 MED ORDER — CYCLOBENZAPRINE HCL 5 MG PO TABS: 5.0000 mg | ORAL_TABLET | Freq: Three times a day (TID) | ORAL | 0 refills | Status: DC | PRN

## 2017-10-13 MED ORDER — HYDROCODONE-ACETAMINOPHEN 5-325 MG PO TABS
1.0000 | ORAL_TABLET | Freq: Once | ORAL | Status: AC
  Administered 2017-10-13: 1 via ORAL
  Filled 2017-10-13: qty 1

## 2017-10-13 MED ORDER — KETOROLAC TROMETHAMINE 30 MG/ML IJ SOLN
30.0000 mg | Freq: Once | INTRAMUSCULAR | Status: AC
  Administered 2017-10-13: 30 mg via INTRAMUSCULAR
  Filled 2017-10-13: qty 1

## 2017-10-13 NOTE — ED Provider Notes (Signed)
[facility] Provider Note   CSN: [id number] Arrival date & time: 10/13/17  0000     History   Chief Complaint Chief Complaint  Patient presents with  . Back Pain    HPI Sherry Schmidt is a 22 y.o. female.  HPI  This is a 22 year old female who presents with back pain.  Patient reports that she was at work living heavy box when her coworker let the box ago.  The box fell onto her and she fell backward onto her buttocks.  Since that time she has had worsening lower back pain.  At times the pain goes into her left leg.  Currently her pain is 7 out of 10.  She has not taken anything for the pain.  The pain is worse with ambulation.  She denies any bowel or bladder difficulty.  She denies any lower extremity weakness but does report increased pain with walking.  Past Medical History:  Diagnosis Date  . Asthma   . Panic attacks   . Thyroid disease     There are no active problems to display for this patient.   Past Surgical History:  Procedure Laterality Date  . THYROIDECTOMY       OB History   None      Home Medications    Prior to Admission medications   Medication Sig Start Date End Date Taking? Authorizing Provider  levothyroxine (SYNTHROID, LEVOTHROID) 137 MCG tablet Take 200 mcg by mouth daily before breakfast.    Yes [provider]  amoxicillin (AMOXIL) 500 MG capsule Take 1 capsule (500 mg total) 2 (two) times daily by mouth. 02/05/17   Doristine Devoid, PA-C  cyclobenzaprine (FLEXERIL) 5 MG tablet Take 1 tablet (5 mg total) by mouth 3 (three) times daily as needed for muscle spasms. 10/13/17   Camree Wigington, Barbette Hair, MD  naproxen (NAPROSYN) 500 MG tablet Take 1 tablet (500 mg total) by mouth 2 (two) times daily. 10/13/17   Smiley Birr, Barbette Hair, MD    Family History No family history on file.  Social History Social History   Tobacco Use  . Smoking status: Never Smoker  . Smokeless tobacco: Never Used  Substance  Use Topics  . Alcohol use: Yes    Comment: occasional  . Drug use: No     Allergies   Patient has no known allergies.   Review of Systems Review of Systems  Constitutional: Negative for fever.  Respiratory: Negative for shortness of breath.   Cardiovascular: Negative for chest pain.  Genitourinary: Negative for difficulty urinating.  Musculoskeletal: Positive for back pain.  Neurological: Negative for weakness and numbness.  All other systems reviewed and are negative.    Physical Exam Updated Vital Signs BP (!) 119/59 (BP Location: Right Arm)   Pulse 93   Temp 98.1 F (36.7 C) (Oral)   Resp 18   Ht 6\' 1"  (1.854 m)   Wt 131.5 kg (290 lb)   LMP 09/13/2017   SpO2 100%   BMI 38.26 kg/m   Physical Exam  Constitutional: She is oriented to person, place, and time. She appears well-developed and well-nourished. No distress.  Obese  HENT:  Head: Normocephalic and atraumatic.  Neck: Neck supple.  Cardiovascular: Normal rate, regular rhythm and normal heart sounds.  Pulmonary/Chest: Effort normal and breath sounds normal. No respiratory distress. She has no wheezes.  Abdominal: Soft. There is no tenderness.  Musculoskeletal:  Tenderness palpation lower lumbar spine, no step-off or deformity noted, positive left  straight leg raise  Neurological: She is alert and oriented to person, place, and time.  Equal patellar reflexes bilaterally, no clonus, 5 out of 5 dorsi and plantar flexion bilaterally, normal gait  Skin: Skin is warm and dry.  Psychiatric: She has a normal mood and affect.  Nursing note and vitals reviewed.    ED Treatments / Results  Labs (all labs ordered are listed, but only abnormal results are displayed) Labs Reviewed  PREGNANCY, URINE    EKG None  Radiology Dg Lumbar Spine Complete  Result Date: 10/13/2017 CLINICAL DATA:  Back pain after attempting to move large package ester day. EXAM: LUMBAR SPINE - COMPLETE 4+ VIEW COMPARISON:  None.  FINDINGS: There is no evidence of lumbar spine fracture. Alignment is normal. There are 5 non ribbed lumbar vertebrae in normal lordosis. Slight disc space narrowing noted at L1-2, L4-5 and L5-S1. No pars defects or listhesis. IMPRESSION: Relative mild disc space narrowing L1-2, L4-5 and L5-S1. No acute osseous abnormality. Electronically Signed   By: Ashley Royalty M.D.   On: 10/13/2017 01:46    Procedures Procedures (including critical care time)  Medications Ordered in ED Medications  ketorolac (TORADOL) 30 MG/ML injection 30 mg (30 mg Intramuscular Given 10/13/17 0109)  HYDROcodone-acetaminophen (NORCO/VICODIN) 5-325 MG per tablet 1 tablet (1 tablet Oral Given 10/13/17 0107)     Initial Impression / Assessment and Plan / ED Course  I have reviewed the triage vital signs and the nursing notes.  Pertinent labs & imaging results that were available during my care of the patient were reviewed by me and considered in my medical decision making (see chart for details).     Patient presents with back pain following fall.  She is overall nontoxic-appearing vital signs reassuring.  No signs of cauda equina on exam.  She does have some history suggestive of sciatica.  She did fall directly on her buttock and back.  For this reason, x-rays were obtained.  No evidence of fracture.  There is disc space narrowing which would likely put her at risk for sciatica or pinched nerve.  Patient improved after Flexeril Toradol.  She is ambulatory.  Recommend supportive measures.  Follow-up with sports medicine if not improving in 4 to 6 weeks.  After history, exam, and medical workup I feel the patient has been appropriately medically screened and is safe for discharge home. Pertinent diagnoses were discussed with the patient. Patient was given return precautions.   Final Clinical Impressions(s) / ED Diagnoses   Final diagnoses:  Acute midline low back pain with left-sided sciatica    ED Discharge Orders         Ordered    naproxen (NAPROSYN) 500 MG tablet  2 times daily     10/13/17 0206    cyclobenzaprine (FLEXERIL) 5 MG tablet  3 times daily PRN     10/13/17 0206       Merryl Hacker, MD 10/13/17 [phone]

## 2017-10-13 NOTE — ED Triage Notes (Signed)
Pt reports she was attempting to move a large package (approx 150lb) at her place of work with another co-worker who let the package go and it fell on her. C/o low back pain worse with sitting and walking

## 2017-10-13 NOTE — ED Notes (Signed)
Waiting on results from upreg prior to performing xrays -- per radiology protocol

## 2017-10-13 NOTE — ED Notes (Signed)
Patient transported to X-ray 

## 2017-10-13 NOTE — Discharge Instructions (Addendum)
You were seen today for back pain.  Some features of your pain suggest sciatica.  There are no fractures on your x-rays.  Take medications as prescribed.  If not improving in 4 to 6 weeks you may need further evaluation with MRI.

## 2018-01-27 ENCOUNTER — Encounter (HOSPITAL_COMMUNITY): Payer: Self-pay | Admitting: Emergency Medicine

## 2018-01-27 ENCOUNTER — Ambulatory Visit (HOSPITAL_COMMUNITY)
Admission: EM | Admit: 2018-01-27 | Discharge: 2018-01-27 | Disposition: A | Discharge status: Home / Self Care | Payer: Self-pay | Attending: Family Medicine | Admitting: Family Medicine

## 2018-01-27 DIAGNOSIS — R197 Diarrhea, unspecified: Secondary | ICD-10-CM

## 2018-01-27 DIAGNOSIS — J00 Acute nasopharyngitis [common cold]: Secondary | ICD-10-CM

## 2018-01-27 DIAGNOSIS — R112 Nausea with vomiting, unspecified: Secondary | ICD-10-CM

## 2018-01-27 MED ORDER — BENZONATATE 100 MG PO CAPS: 100.0000 mg | ORAL_CAPSULE | Freq: Three times a day (TID) | ORAL | 0 refills | Status: DC

## 2018-01-27 MED ORDER — ONDANSETRON 4 MG PO TBDP
ORAL_TABLET | ORAL | Status: AC
  Filled 2018-01-27: qty 1

## 2018-01-27 MED ORDER — FLUTICASONE PROPIONATE 50 MCG/ACT NA SUSP: 2.0000 | Freq: Every day | NASAL | 0 refills | Status: DC

## 2018-01-27 MED ORDER — ONDANSETRON 4 MG PO TBDP
4.0000 mg | ORAL_TABLET | Freq: Once | ORAL | Status: AC
  Administered 2018-01-27: 4 mg via ORAL

## 2018-01-27 MED ORDER — ONDANSETRON 4 MG PO TBDP: 4.0000 mg | ORAL_TABLET | Freq: Three times a day (TID) | ORAL | 0 refills | Status: DC | PRN

## 2018-01-27 NOTE — ED Provider Notes (Signed)
Sherry Schmidt    CSN: [id number] Arrival date & time: 01/27/18  1812     History   Chief Complaint Chief Complaint  Patient presents with  . URI    HPI Sherry Schmidt is a 22 y.o. female.   22 year old female comes in for 2-day history of URI symptoms.  Has had rhinorrhea, nasal congestion, mild productive cough.  Has had 2-3 episodes of nonbilious nonbloody vomit a day, sometimes posttussive.  Has had 1-2 episodes of loose stools per day.  Has some periumbilical pain, but this is more with coughing.  She has had trouble keeping food down, but has been able to tolerate fluid intake.  Has had subjective fever and chills.  Body aches.  Has not needed to use her inhaler.  Never smoker.  OTC cold medication without relief.  Positive sick contact.     Past Medical History:  Diagnosis Date  . Asthma   . Panic attacks   . Thyroid disease     There are no active problems to display for this patient.   Past Surgical History:  Procedure Laterality Date  . THYROIDECTOMY      OB History   None      Home Medications    Prior to Admission medications   Medication Sig Start Date End Date Taking? Authorizing Provider  levothyroxine (SYNTHROID, LEVOTHROID) 137 MCG tablet Take 200 mcg by mouth daily before breakfast.    Yes [provider]  benzonatate (TESSALON) 100 MG capsule Take 1 capsule (100 mg total) by mouth every 8 (eight) hours. 01/27/18   Tasia Catchings, Ramiya Delahunty V, PA-C  fluticasone (FLONASE) 50 MCG/ACT nasal spray Place 2 sprays into both nostrils daily. 01/27/18   Tasia Catchings, Jazmeen Axtell V, PA-C  ondansetron (ZOFRAN ODT) 4 MG disintegrating tablet Take 1 tablet (4 mg total) by mouth every 8 (eight) hours as needed for nausea or vomiting. 01/27/18   Ok Edwards, PA-C    Family History No family history on file.  Social History Social History   Tobacco Use  . Smoking status: Never Smoker  . Smokeless tobacco: Never Used  Substance Use Topics  . Alcohol use: Yes   Comment: occasional  . Drug use: No     Allergies   Patient has no known allergies.   Review of Systems Review of Systems  Reason unable to perform ROS: See HPI as above.     Physical Exam Triage Vital Signs ED Triage Vitals  Enc Vitals Group     BP 01/27/18 1906 130/83     Pulse Rate 01/27/18 1905 92     Resp 01/27/18 1905 16     Temp 01/27/18 1905 99.6 F (37.6 C)     Temp Source 01/27/18 1905 Temporal     SpO2 01/27/18 1905 100 %     Weight --      Height --      Head Circumference --      Peak Flow --      Pain Score 01/27/18 1905 4     Pain Loc --      Pain Edu? --      Excl. in [place]? --    No data found.  Updated Vital Signs BP 130/83   Pulse 92   Temp 99.6 F (37.6 C) (Temporal)   Resp 16   LMP 01/19/2018   SpO2 100%   Physical Exam  Constitutional: She is oriented to person, place, and time. She appears well-developed and well-nourished. No distress.  HENT:  Head: Normocephalic and atraumatic.  Right Ear: Tympanic membrane, external ear and ear canal normal. Tympanic membrane is not erythematous and not bulging.  Left Ear: Tympanic membrane, external ear and ear canal normal. Tympanic membrane is not erythematous and not bulging.  Nose: Rhinorrhea present. Right sinus exhibits maxillary sinus tenderness. Right sinus exhibits no frontal sinus tenderness. Left sinus exhibits maxillary sinus tenderness. Left sinus exhibits no frontal sinus tenderness.  Mouth/Throat: Uvula is midline, oropharynx is clear and moist and mucous membranes are normal.  Eyes: Pupils are equal, round, and reactive to light. Conjunctivae are normal.  Neck: Normal range of motion. Neck supple.  Cardiovascular: Normal rate, regular rhythm and normal heart sounds. Exam reveals no gallop and no friction rub.  No murmur heard. Pulmonary/Chest: Effort normal and breath sounds normal. No stridor. No respiratory distress. She has no decreased breath sounds. She has no wheezes. She has no  rhonchi. She has no rales.  Abdominal: Soft. Bowel sounds are normal. There is no tenderness. There is no rebound and no guarding.  Lymphadenopathy:    She has no cervical adenopathy.  Neurological: She is alert and oriented to person, place, and time.  Skin: Skin is warm and dry.  Psychiatric: She has a normal mood and affect. Her behavior is normal. Judgment normal.    UC Treatments / Results  Labs (all labs ordered are listed, but only abnormal results are displayed) Labs Reviewed - No data to display  EKG None  Radiology No results found.  Procedures Procedures (including critical care time)  Medications Ordered in UC Medications  ondansetron (ZOFRAN-ODT) disintegrating tablet 4 mg (has no administration in time range)    Initial Impression / Assessment and Plan / UC Course  I have reviewed the triage vital signs and the nursing notes.  Pertinent labs & imaging results that were available during my care of the patient were reviewed by me and considered in my medical decision making (see chart for details).    Discussed with patient history and exam most consistent with viral URI. Symptomatic treatment as needed. Push fluids. Return precautions given.   Final Clinical Impressions(s) / UC Diagnoses   Final diagnoses:  Acute nasopharyngitis  Nausea vomiting and diarrhea    ED Prescriptions    Medication Sig Dispense Auth. Provider   ondansetron (ZOFRAN ODT) 4 MG disintegrating tablet Take 1 tablet (4 mg total) by mouth every 8 (eight) hours as needed for nausea or vomiting. 15 tablet Sapphira Harjo V, PA-C   fluticasone (FLONASE) 50 MCG/ACT nasal spray Place 2 sprays into both nostrils daily. 1 g Heberto Sturdevant V, PA-C   benzonatate (TESSALON) 100 MG capsule Take 1 capsule (100 mg total) by mouth every 8 (eight) hours. 21 capsule Tobin Chad, [place] 01/27/18 1933

## 2018-01-27 NOTE — Discharge Instructions (Signed)
Tessalon for cough. Zofran for nausea/vomiting. Start flonase for nasal congestion/drainage. You can use over the counter nasal saline rinse such as neti pot for nasal congestion. Keep hydrated, your urine should be clear to pale yellow in color. Tylenol/motrin for fever and pain. Monitor for any worsening of symptoms, chest pain, shortness of breath, wheezing, swelling of the throat, follow up for reevaluation.   For sore throat/cough try using a honey-based tea. Use 3 teaspoons of honey with juice squeezed from half lemon. Place shaved pieces of ginger into 1/2-1 cup of water and warm over stove top. Then mix the ingredients and repeat every 4 hours as needed.

## 2018-01-27 NOTE — ED Triage Notes (Signed)
Cough, emesis, fatigue, diarrhea, body aches for 2 days.

## 2018-05-07 ENCOUNTER — Other Ambulatory Visit: Payer: Self-pay

## 2018-05-07 ENCOUNTER — Encounter (HOSPITAL_BASED_OUTPATIENT_CLINIC_OR_DEPARTMENT_OTHER): Payer: Self-pay | Admitting: Emergency Medicine

## 2018-05-07 ENCOUNTER — Emergency Department (HOSPITAL_BASED_OUTPATIENT_CLINIC_OR_DEPARTMENT_OTHER)
Admission: EM | Admit: 2018-05-07 | Discharge: 2018-05-07 | Disposition: A | Discharge status: Home / Self Care | Payer: Medicaid Other | Attending: Emergency Medicine | Admitting: Emergency Medicine

## 2018-05-07 DIAGNOSIS — K649 Unspecified hemorrhoids: Secondary | ICD-10-CM | POA: Diagnosis not present

## 2018-05-07 DIAGNOSIS — K625 Hemorrhage of anus and rectum: Secondary | ICD-10-CM

## 2018-05-07 DIAGNOSIS — A599 Trichomoniasis, unspecified: Secondary | ICD-10-CM | POA: Diagnosis not present

## 2018-05-07 DIAGNOSIS — K5900 Constipation, unspecified: Secondary | ICD-10-CM | POA: Insufficient documentation

## 2018-05-07 DIAGNOSIS — A5900 Urogenital trichomoniasis, unspecified: Secondary | ICD-10-CM | POA: Insufficient documentation

## 2018-05-07 DIAGNOSIS — J45909 Unspecified asthma, uncomplicated: Secondary | ICD-10-CM | POA: Insufficient documentation

## 2018-05-07 DIAGNOSIS — M545 Low back pain, unspecified: Secondary | ICD-10-CM

## 2018-05-07 HISTORY — DX: Dorsalgia, unspecified: M54.9

## 2018-05-07 LAB — WET PREP, GENITAL
Sperm: NONE SEEN
Yeast Wet Prep HPF POC: NONE SEEN

## 2018-05-07 LAB — URINALYSIS, MICROSCOPIC (REFLEX)

## 2018-05-07 LAB — PREGNANCY, URINE: Preg Test, Ur: NEGATIVE

## 2018-05-07 LAB — URINALYSIS, ROUTINE W REFLEX MICROSCOPIC
BILIRUBIN URINE: NEGATIVE
Glucose, UA: NEGATIVE mg/dL
Ketones, ur: NEGATIVE mg/dL
Nitrite: NEGATIVE
Protein, ur: NEGATIVE mg/dL
SPECIFIC GRAVITY, URINE: 1.025 (ref 1.005–1.030)
pH: 6 (ref 5.0–8.0)

## 2018-05-07 LAB — OCCULT BLOOD X 1 CARD TO LAB, STOOL: Fecal Occult Bld: NEGATIVE

## 2018-05-07 MED ORDER — FLUCONAZOLE 200 MG PO TABS: 200.0000 mg | ORAL_TABLET | Freq: Every day | ORAL | 0 refills | Status: AC | PRN

## 2018-05-07 MED ORDER — AZITHROMYCIN 250 MG PO TABS
1000.0000 mg | ORAL_TABLET | Freq: Once | ORAL | Status: AC
  Administered 2018-05-07: 1000 mg via ORAL
  Filled 2018-05-07: qty 4

## 2018-05-07 MED ORDER — HYDROCORTISONE 2.5 % RE CREA: TOPICAL_CREAM | RECTAL | 0 refills | Status: DC

## 2018-05-07 MED ORDER — LIDOCAINE HCL (PF) 1 % IJ SOLN
INTRAMUSCULAR | Status: AC
  Administered 2018-05-07: 1.2 mL
  Filled 2018-05-07: qty 5

## 2018-05-07 MED ORDER — CEFTRIAXONE SODIUM 250 MG IJ SOLR
250.0000 mg | Freq: Once | INTRAMUSCULAR | Status: AC
  Administered 2018-05-07: 250 mg via INTRAMUSCULAR
  Filled 2018-05-07: qty 250

## 2018-05-07 MED ORDER — METRONIDAZOLE 500 MG PO TABS
2000.0000 mg | ORAL_TABLET | Freq: Once | ORAL | Status: AC
  Administered 2018-05-07: 2000 mg via ORAL
  Filled 2018-05-07: qty 4

## 2018-05-07 MED FILL — PROCTOZONE-HC 2.5 % CREA: 2.5 | 15 days supply | Qty: 30 | Fill #0

## 2018-05-07 NOTE — Discharge Instructions (Signed)
You have been seen today in the Emergency Department (ED) for pelvic pain and discharge.  Your workup today shows that you had one or more sexually transmitted infections (STIs).  You have been treated with a one time dose medication for both of these conditions.  Please have your partner tested for STD's and do not resume sexual activity until you and your partner have confirmed negative test results or have both been treated.  Use over-the-counter Miralax for constipation.   Please follow up with your doctor as soon as possible regarding todays ED visit and your symptoms.   Return to the ED if your pain worsens, you develop a fever, or for any other symptoms that concern you.

## 2018-05-07 NOTE — ED Notes (Signed)
ED Provider at bedside. 

## 2018-05-07 NOTE — ED Notes (Signed)
ED Provider at bedside discussing test results and dispo plan of care. 

## 2018-05-07 NOTE — ED Provider Notes (Signed)
Emergency Department Provider Note   I have reviewed the triage vital signs and the nursing notes.   HISTORY  Chief Complaint GI Bleeding   HPI Sherry Schmidt is a 23 y.o. female with PMH of asthma and chronic lower back pain presents to the emergency department for evaluation of lower back pain and painful bowel movements with blood in the stool.  The patient noticing small amounts of bright red blood with bowel movements.  She describes history of constipation with painful BMs.  She has no known history of hemorrhoids.  She denies any black or large volume bright red bleeding.  No shortness of breath, lightheadedness, fatigue.  Denies any vaginal bleeding or discharge.  No UTI symptoms.  No blood in the urine that she has noticed.   In terms of the patient's back pain she states she is had this for several months and has been an ongoing issue for her.  She states her lower back which is slightly to the left side.  Pain radiates into the upper thigh.  No numbness, weakness.  No fevers or chills.  No groin numbness.  Patient works at loading delivery trucks.  She has ordered a lower back brace for increased support but has not arrived yet.  No new injury.   Past Medical History:  Diagnosis Date  . Asthma   . Back pain   . Panic attacks   . Thyroid disease     There are no active problems to display for this patient.   Past Surgical History:  Procedure Laterality Date  . THYROIDECTOMY     Allergies Patient has no known allergies.  No family history on file.  Social History Social History   Tobacco Use  . Smoking status: Never Smoker  . Smokeless tobacco: Never Used  Substance Use Topics  . Alcohol use: Yes    Comment: occasional  . Drug use: No    Review of Systems  Constitutional: No fever/chills Eyes: No visual changes. ENT: No sore throat. Cardiovascular: Denies chest pain. Respiratory: Denies shortness of breath. Gastrointestinal: No abdominal pain.   No nausea, no vomiting.  No diarrhea.  No constipation. Blood in the BMs and pain with BMs.  Genitourinary: Negative for dysuria. Musculoskeletal: Positive for back pain. Skin: Negative for rash. Neurological: Negative for headaches, focal weakness or numbness.  10-point ROS otherwise negative.  ____________________________________________   PHYSICAL EXAM:  VITAL SIGNS: ED Triage Vitals  Enc Vitals Group     BP 05/07/18 0900 126/72     Pulse Rate 05/07/18 0900 74     Resp 05/07/18 0900 18     Temp 05/07/18 0900 98 F (36.7 C)     Temp Source 05/07/18 0900 Oral     SpO2 05/07/18 0900 98 %     Weight 05/07/18 0900 295 lb (133.8 kg)     Height 05/07/18 0900 6\' 1"  (1.854 m)     Pain Score 05/07/18 0828 3   Constitutional: Alert and oriented. Well appearing and in no acute distress. Eyes: Conjunctivae are normal.  Head: Atraumatic. Nose: No congestion/rhinnorhea. Mouth/Throat: Mucous membranes are moist.  Neck: No stridor.   Cardiovascular: Normal rate, regular rhythm. Good peripheral circulation. Grossly normal heart sounds.   Respiratory: Normal respiratory effort.  No retractions. Lungs CTAB. Gastrointestinal: Soft and nontender. No distention. Rectal exam performed with verbal consent and chaperone present. External, non-thrombosed hemorrhoid noted. No fissures. No concern for abscess.  GU: Chaperone present for exam. Moderate vaginal discharge. No bleeding.  Normal cervix. No CMT or adnexal tenderness.  Musculoskeletal: No lower extremity tenderness nor edema. No gross deformities of extremities. Neurologic:  Normal speech and language. No gross focal neurologic deficits are appreciated. Normal patellar reflexes bilaterally.  Skin:  Skin is warm, dry and intact. No rash noted.  ____________________________________________   LABS (all labs ordered are listed, but only abnormal results are displayed)  Labs Reviewed  WET PREP, GENITAL - Abnormal; Notable for the following  components:      Result Value   Trich, Wet Prep PRESENT (*)    Clue Cells Wet Prep HPF POC PRESENT (*)    WBC, Wet Prep HPF POC MANY (*)    All other components within normal limits  URINALYSIS, ROUTINE W REFLEX MICROSCOPIC - Abnormal; Notable for the following components:   APPearance HAZY (*)    Hgb urine dipstick SMALL (*)    Leukocytes, UA LARGE (*)    All other components within normal limits  URINALYSIS, MICROSCOPIC (REFLEX) - Abnormal; Notable for the following components:   Bacteria, UA FEW (*)    Trichomonas, UA PRESENT (*)    All other components within normal limits  OCCULT BLOOD X 1 CARD TO LAB, STOOL  PREGNANCY, URINE  HIV ANTIBODY (ROUTINE TESTING W REFLEX)  RPR  GC/CHLAMYDIA PROBE AMP ([place]) NOT AT [facility]   ____________________________________________  RADIOLOGY  None ____________________________________________   PROCEDURES  Procedure(s) performed:   Procedures  None ____________________________________________   INITIAL IMPRESSION / ASSESSMENT AND PLAN / ED COURSE  Pertinent labs & imaging results that were available during my care of the patient were reviewed by me and considered in my medical decision making (see chart for details).  Presents to the emergency department with what appears to be 2 separate issues.  Her blood in the bowel movements along with pain appears to be coming from 2 external hemorrhoids.  No evidence of thrombosis or abscess.  No fissures.  Plan for Anusol and stool softener.  No concern for Crohn's, diverticulitis, or other intra-abdominal pathology at this time.   The patient's back pain appears to be musculoskeletal.  She has no findings on exam or historical features to indicate spinal cord emergency.  No indication for imaging at this time.  Patient has a job with repetitive, heavy lifting and has ordered a back brace.  Will advise continuing Tylenol/Motrin as needed for pain along with topical medications which are  available over-the-counter.  I will write a work note to be off for several days to allow her back to rest.  Discussed that she may need PCP follow-up and possible orthopedic referral if symptoms worsen.   Patient with Trich on UA. Discussed results and recommended pelvic. No concern after exam for PID or TOA. Patient treated for trich and also empirically for gonorrhea/chlamydia. HIV and RPR sent. Discussed needed to alert and have any sexual partners treated. Patient provided Fluconazole if yeast infection symptoms develop. No UTI symptoms. UA likely dirty 2/2 trich infection.  ____________________________________________  FINAL CLINICAL IMPRESSION(S) / ED DIAGNOSES  Final diagnoses:  Acute midline low back pain without sciatica  Hemorrhoids, unspecified hemorrhoid type  Rectal bleeding  Trichomonal infection  Constipation, unspecified constipation type     MEDICATIONS GIVEN DURING THIS VISIT:  Medications  metroNIDAZOLE (FLAGYL) tablet 2,000 mg (2,000 mg Oral Given 05/07/18 1034)  azithromycin (ZITHROMAX) tablet 1,000 mg (1,000 mg Oral Given 05/07/18 1034)  cefTRIAXone (ROCEPHIN) injection 250 mg (250 mg Intramuscular Given 05/07/18 1036)  lidocaine (PF) (XYLOCAINE) 1 %  injection (1.2 mLs  Given 05/07/18 1036)     NEW OUTPATIENT MEDICATIONS STARTED DURING THIS VISIT:  New Prescriptions   FLUCONAZOLE (DIFLUCAN) 200 MG TABLET    Take 1 tablet (200 mg total) by mouth daily as needed for up to 2 days (yeast infection symptoms).    Note:  This document was prepared using Dragon voice recognition software and may include unintentional dictation errors.  Nanda Quinton, MD Emergency Medicine    Long, Wonda Olds, MD 05/07/18 [phone]

## 2018-05-07 NOTE — ED Triage Notes (Signed)
Pt reports chronic low back pain but since yesterday she has been having blood in her stool.  No hx of ever having blood in stool.

## 2018-05-08 LAB — RPR: RPR Ser Ql: NONREACTIVE

## 2018-05-08 LAB — HIV ANTIBODY (ROUTINE TESTING W REFLEX): HIV SCREEN 4TH GENERATION: NONREACTIVE

## 2018-05-09 LAB — GC/CHLAMYDIA PROBE AMP (~~LOC~~) NOT AT ARMC
CHLAMYDIA, DNA PROBE: NEGATIVE
Neisseria Gonorrhea: NEGATIVE

## 2019-02-17 DIAGNOSIS — Z03818 Encounter for observation for suspected exposure to other biological agents ruled out: Secondary | ICD-10-CM | POA: Diagnosis not present

## 2019-03-20 ENCOUNTER — Encounter (HOSPITAL_BASED_OUTPATIENT_CLINIC_OR_DEPARTMENT_OTHER): Payer: Self-pay | Admitting: Emergency Medicine

## 2019-03-20 ENCOUNTER — Emergency Department (HOSPITAL_BASED_OUTPATIENT_CLINIC_OR_DEPARTMENT_OTHER)
Admission: EM | Admit: 2019-03-20 | Discharge: 2019-03-20 | Disposition: A | Discharge status: Home / Self Care | Payer: BLUE CROSS/BLUE SHIELD | Attending: Emergency Medicine | Admitting: Emergency Medicine

## 2019-03-20 ENCOUNTER — Other Ambulatory Visit: Payer: Self-pay

## 2019-03-20 ENCOUNTER — Emergency Department (HOSPITAL_BASED_OUTPATIENT_CLINIC_OR_DEPARTMENT_OTHER): Payer: BLUE CROSS/BLUE SHIELD

## 2019-03-20 DIAGNOSIS — J45909 Unspecified asthma, uncomplicated: Secondary | ICD-10-CM | POA: Insufficient documentation

## 2019-03-20 DIAGNOSIS — E079 Disorder of thyroid, unspecified: Secondary | ICD-10-CM | POA: Diagnosis not present

## 2019-03-20 DIAGNOSIS — Y929 Unspecified place or not applicable: Secondary | ICD-10-CM | POA: Insufficient documentation

## 2019-03-20 DIAGNOSIS — Z79899 Other long term (current) drug therapy: Secondary | ICD-10-CM | POA: Insufficient documentation

## 2019-03-20 DIAGNOSIS — Y9389 Activity, other specified: Secondary | ICD-10-CM | POA: Insufficient documentation

## 2019-03-20 DIAGNOSIS — S40021A Contusion of right upper arm, initial encounter: Secondary | ICD-10-CM | POA: Insufficient documentation

## 2019-03-20 DIAGNOSIS — Y999 Unspecified external cause status: Secondary | ICD-10-CM | POA: Diagnosis not present

## 2019-03-20 DIAGNOSIS — X58XXXA Exposure to other specified factors, initial encounter: Secondary | ICD-10-CM | POA: Insufficient documentation

## 2019-03-20 DIAGNOSIS — M79601 Pain in right arm: Secondary | ICD-10-CM | POA: Diagnosis not present

## 2019-03-20 DIAGNOSIS — S4981XA Other specified injuries of right shoulder and upper arm, initial encounter: Secondary | ICD-10-CM | POA: Diagnosis not present

## 2019-03-20 NOTE — ED Provider Notes (Signed)
[facility] EMERGENCY DEPARTMENT Provider Note   CSN: [id number] Arrival date & time: 03/20/19  [id number]     History Chief Complaint  Patient presents with  . Arm Pain    Sherry Schmidt is a 23 y.o. female.  HPI   23 y/o female with a h/o asthma, back pain, panic attacks, thyroid disease who presents to the ED today c/o RUE pain, swelling, and bruising that she noticed yesterday. States she donated plasma for the first time on 12/17. She does not remember bending her arm at all during donation or having any obvious pain. She c/o some intermittent tingling to 3-5th fingers on the right arm since the swelling started. Denies, cp, sob, fevers.   Past Medical History:  Diagnosis Date  . Asthma   . Back pain   . Panic attacks   . Thyroid disease     There are no problems to display for this patient.   Past Surgical History:  Procedure Laterality Date  . THYROIDECTOMY       OB History   No obstetric history on file.     No family history on file.  Social History   Tobacco Use  . Smoking status: Never Smoker  . Smokeless tobacco: Never Used  Substance Use Topics  . Alcohol use: Yes    Comment: occasional  . Drug use: No    Home Medications Prior to Admission medications   Medication Sig Start Date End Date Taking? Authorizing Provider  albuterol (PROVENTIL HFA;VENTOLIN HFA) 108 (90 Base) MCG/ACT inhaler Inhale 2 puffs into the lungs every 6 (six) hours as needed for wheezing or shortness of breath.    [provider]  hydrocortisone (ANUSOL-HC) 2.5 % rectal cream Apply rectally 2 times daily 05/07/18   Long, Wonda Olds, MD  levothyroxine (SYNTHROID, LEVOTHROID) 137 MCG tablet Take 200 mcg by mouth daily before breakfast.     [provider]    Allergies    Patient has no known allergies.  Review of Systems   Review of Systems  Constitutional: Negative for fever.  Eyes: Negative for visual disturbance.  Respiratory: Negative for  shortness of breath.   Cardiovascular: Negative for chest pain.  Gastrointestinal: Negative for abdominal pain.  Genitourinary: Negative for pelvic pain.  Musculoskeletal:       RUE swelling, pain  Skin: Positive for color change.    Physical Exam Updated Vital Signs BP 121/67 (BP Location: Left Arm)   Pulse 82   Temp 99.3 F (37.4 C) (Oral)   Resp 18   Ht 6' (1.829 m)   Wt (!) 145.2 kg   LMP 02/15/2019   SpO2 100%   BMI 43.40 kg/m   Physical Exam Vitals and nursing note reviewed.  Constitutional:      General: She is not in acute distress.    Appearance: She is well-developed.  HENT:     Head: Normocephalic and atraumatic.  Eyes:     Conjunctiva/sclera: Conjunctivae normal.  Cardiovascular:     Rate and Rhythm: Normal rate.  Pulmonary:     Effort: Pulmonary effort is normal.  Musculoskeletal:        General: Normal range of motion.     Cervical back: Neck supple.     Comments: Mild swelling, ttp, and ecchymosis to the RUE just proximal and medial to the [facility]. No warmth or erythema. Radial/ulnar pulses intact. Strength and sensation intact and symmetric.   Skin:    General: Skin is warm and dry.  Neurological:     Mental Status: She is alert.     ED Results / Procedures / Treatments   Labs (all labs ordered are listed, but only abnormal results are displayed) Labs Reviewed - No data to display  EKG None  Radiology US Venous Img Upper Right (DVT Study)  Result Date: 03/20/2019 CLINICAL DATA:  Pain and discoloration of right antecubital fossa. Recent plasma donation. EXAM: RIGHT UPPER EXTREMITY VENOUS DOPPLER ULTRASOUND TECHNIQUE: Gray-scale sonography with graded compression, as well as color Doppler and duplex ultrasound were performed to evaluate the upper extremity deep venous system from the level of the subclavian vein and including the jugular, axillary, basilic, radial, ulnar and upper cephalic vein. Spectral Doppler was utilized to evaluate flow at rest  and with distal augmentation maneuvers. COMPARISON:  None. FINDINGS: Contralateral Subclavian Vein: Respiratory phasicity is normal and symmetric with the symptomatic side. No evidence of thrombus. Normal compressibility. Internal Jugular Vein: No evidence of thrombus. Normal compressibility, respiratory phasicity and response to augmentation. Subclavian Vein: No evidence of thrombus. Normal compressibility, respiratory phasicity and response to augmentation. Axillary Vein: No evidence of thrombus. Normal compressibility, respiratory phasicity and response to augmentation. Cephalic Vein: No evidence of thrombus. Normal compressibility, respiratory phasicity and response to augmentation. Basilic Vein: No evidence of thrombus. Normal compressibility, respiratory phasicity and response to augmentation. Brachial Veins: No evidence of thrombus. Normal compressibility, respiratory phasicity and response to augmentation. Radial Veins: No evidence of thrombus. Normal compressibility, respiratory phasicity and response to augmentation. Ulnar Veins: No evidence of thrombus. Normal compressibility, respiratory phasicity and response to augmentation. Venous Reflux:  None visualized. Other Findings: No evidence of superficial thrombophlebitis or abnormal fluid collection. IMPRESSION: No evidence of DVT within the right upper extremity. Electronically Signed   By: Aletta Edouard M.D.   On: 03/20/2019 11:02    Procedures Procedures (including critical care time)  Medications Ordered in ED Medications - No data to display  ED Course  I have reviewed the triage vital signs and the nursing notes.  Pertinent labs & imaging results that were available during my care of the patient were reviewed by me and considered in my medical decision making (see chart for details).    MDM Rules/Calculators/A&P                     23 year old female presenting for evaluation of right upper extremity swelling and ecchymosis that  started 2 days after donating plasma for the first time.  Denies any obvious new injury while donating.  No chest pain/shortness of breath. RUE [place] neg for DVT or superficial thrombophlebitis. Likely formed hematoma from blown vein during plasma donation. Pt advised on warm compresses for the first 48 hours and then switch to cool compresses following this. No emergent cause identified. Advised to f/u with pcp and return if worse. Pt voices understanding and is in agreement with plan. All questions answered, pt stable for d/c.    Final Clinical Impression(s) / ED Diagnoses Final diagnoses:  Contusion of right upper extremity, initial encounter    Rx / DC Orders ED Discharge Orders    None       Bishop Dublin 03/20/19 1110    Quintella Reichert, MD 03/20/19 1450

## 2019-03-20 NOTE — ED Triage Notes (Signed)
Pt donated plasma 12/17. She now has pain, swelling, and bruising around the IV site.

## 2019-03-20 NOTE — Discharge Instructions (Addendum)
Use warm/moist compresses to the right arm for the next 48 hours. Use compresses for 20 min at a time. After 48 hours switch to cold compresses.   You may alternate taking Tylenol and Ibuprofen as needed for pain control. You may take 400-600 mg of ibuprofen every 6 hours and [phone] mg of Tylenol every 6 hours. Do not exceed 4000 mg of Tylenol daily as this can lead to liver damage. Also, make sure to take Ibuprofen with meals as it can cause an upset stomach. Do not take other NSAIDs while taking Ibuprofen such as (Aleve, Naprosyn, Aspirin, Celebrex, etc) and do not take more than the prescribed dose as this can lead to ulcers and bleeding in your GI tract. You may use warm and cold compresses to help with your symptoms.   Please follow up with your primary doctor within the next 7-10 days for re-evaluation and further treatment of your symptoms.   Please return to the ER sooner if you have any new or worsening symptoms.

## 2019-04-07 DIAGNOSIS — U071 COVID-19: Secondary | ICD-10-CM | POA: Diagnosis not present

## 2019-04-07 DIAGNOSIS — Z20828 Contact with and (suspected) exposure to other viral communicable diseases: Secondary | ICD-10-CM | POA: Diagnosis not present

## 2019-04-07 DIAGNOSIS — Z03818 Encounter for observation for suspected exposure to other biological agents ruled out: Secondary | ICD-10-CM | POA: Diagnosis not present

## 2019-06-02 ENCOUNTER — Ambulatory Visit (HOSPITAL_COMMUNITY)
Admission: EM | Admit: 2019-06-02 | Discharge: 2019-06-02 | Discharge status: Absent without leave | Payer: BC Managed Care – PPO

## 2019-06-02 ENCOUNTER — Other Ambulatory Visit: Payer: Self-pay

## 2019-08-06 ENCOUNTER — Emergency Department (HOSPITAL_BASED_OUTPATIENT_CLINIC_OR_DEPARTMENT_OTHER)
Admission: EM | Admit: 2019-08-06 | Discharge: 2019-08-06 | Disposition: A | Discharge status: Home / Self Care | Payer: BC Managed Care – PPO | Attending: Emergency Medicine | Admitting: Emergency Medicine

## 2019-08-06 ENCOUNTER — Encounter (HOSPITAL_BASED_OUTPATIENT_CLINIC_OR_DEPARTMENT_OTHER): Payer: Self-pay | Admitting: Emergency Medicine

## 2019-08-06 ENCOUNTER — Other Ambulatory Visit: Payer: Self-pay

## 2019-08-06 DIAGNOSIS — E079 Disorder of thyroid, unspecified: Secondary | ICD-10-CM | POA: Diagnosis not present

## 2019-08-06 DIAGNOSIS — J45909 Unspecified asthma, uncomplicated: Secondary | ICD-10-CM | POA: Diagnosis not present

## 2019-08-06 DIAGNOSIS — H9203 Otalgia, bilateral: Secondary | ICD-10-CM | POA: Diagnosis not present

## 2019-08-06 DIAGNOSIS — R0981 Nasal congestion: Secondary | ICD-10-CM | POA: Insufficient documentation

## 2019-08-06 DIAGNOSIS — Z79899 Other long term (current) drug therapy: Secondary | ICD-10-CM | POA: Diagnosis not present

## 2019-08-06 MED ORDER — FLUTICASONE PROPIONATE 50 MCG/ACT NA SUSP: 2.0000 | Freq: Every day | NASAL | 0 refills | Status: DC

## 2019-08-06 MED ORDER — CETIRIZINE-PSEUDOEPHEDRINE ER 5-120 MG PO TB12: 1.0000 | ORAL_TABLET | Freq: Two times a day (BID) | ORAL | 0 refills | Status: DC

## 2019-08-06 MED ORDER — LORATADINE 10 MG PO TABS
10.0000 mg | ORAL_TABLET | Freq: Once | ORAL | Status: AC
  Administered 2019-08-06: 05:00:00 10 mg via ORAL
  Filled 2019-08-06: qty 1

## 2019-08-06 MED ORDER — OXYMETAZOLINE HCL 0.05 % NA SOLN
1.0000 | Freq: Once | NASAL | Status: AC
  Administered 2019-08-06: 1 via NASAL
  Filled 2019-08-06: qty 30

## 2019-08-06 MED ORDER — ACETAMINOPHEN 500 MG PO TABS
1000.0000 mg | ORAL_TABLET | Freq: Once | ORAL | Status: AC
  Administered 2019-08-06: 05:00:00 1000 mg via ORAL
  Filled 2019-08-06: qty 2

## 2019-08-06 NOTE — ED Triage Notes (Signed)
Pt reports congestion for a couple of days and left ear pain starting last night.

## 2019-08-06 NOTE — ED Provider Notes (Signed)
[facility] EMERGENCY DEPARTMENT Provider Note   CSN: [id number] Arrival date & time: 08/06/19  0428     History Chief Complaint  Patient presents with  . Otalgia    Sherry Schmidt is a 24 y.o. female.  The history is provided by the patient.  Otalgia Location:  Bilateral Behind ear:  No abnormality Quality:  Aching Severity:  Severe Onset quality:  Gradual Duration:  2 days Timing:  Constant Progression:  Unchanged Chronicity:  New Context: not direct blow and not elevation change   Relieved by:  Nothing Worsened by:  Nothing Ineffective treatments:  None tried Associated symptoms: congestion   Associated symptoms: no abdominal pain, no cough, no diarrhea, no ear discharge, no fever, no hearing loss, no rash, no rhinorrhea, no sore throat and no tinnitus   Risk factors: no recent travel   Denies cough, SOB or covid contacts.  No f/c/r.       Past Medical History:  Diagnosis Date  . Asthma   . Back pain   . Panic attacks   . Thyroid disease     There are no problems to display for this patient.   Past Surgical History:  Procedure Laterality Date  . THYROIDECTOMY       OB History   No obstetric history on file.     History reviewed. No pertinent family history.  Social History   Tobacco Use  . Smoking status: Never Smoker  . Smokeless tobacco: Never Used  Substance Use Topics  . Alcohol use: Yes    Comment: occasional  . Drug use: No    Home Medications Prior to Admission medications   Medication Sig Start Date End Date Taking? Authorizing Provider  albuterol (PROVENTIL HFA;VENTOLIN HFA) 108 (90 Base) MCG/ACT inhaler Inhale 2 puffs into the lungs every 6 (six) hours as needed for wheezing or shortness of breath.    [provider]  cetirizine-pseudoephedrine (ZYRTEC-D) 5-120 MG tablet Take 1 tablet by mouth 2 (two) times daily. 08/06/19   Mahrosh Donnell, MD  fluticasone (FLONASE) 50 MCG/ACT nasal spray Place 2 sprays into  both nostrils daily. 08/06/19   Machel Violante, MD  levothyroxine (SYNTHROID, LEVOTHROID) 137 MCG tablet Take 200 mcg by mouth daily before breakfast.     [provider]    Allergies    Patient has no known allergies.  Review of Systems   Review of Systems  Constitutional: Negative for fever.  HENT: Positive for congestion and ear pain. Negative for ear discharge, hearing loss, rhinorrhea, sore throat and tinnitus.   Respiratory: Negative for cough.   Cardiovascular: Negative for chest pain.  Gastrointestinal: Negative for abdominal pain and diarrhea.  Musculoskeletal: Negative for arthralgias.  Skin: Negative for rash.  Neurological: Negative for dizziness.  Psychiatric/Behavioral: Negative for agitation.  All other systems reviewed and are negative.   Physical Exam Updated Vital Signs BP 127/90 (BP Location: Right Arm)   Pulse 75   Temp 97.8 F (36.6 C) (Oral)   Resp 16   Ht 6\' 1"  (1.854 m)   Wt (!) 142.9 kg   SpO2 100%   BMI 41.56 kg/m   Physical Exam Vitals and nursing note reviewed.  Constitutional:      Appearance: Normal appearance.  HENT:     Head: Normocephalic and atraumatic.     Right Ear: No middle ear effusion. There is no impacted cerumen. No mastoid tenderness. Tympanic membrane is not injected, scarred or perforated.     Left Ear:  No  middle ear effusion. There is no impacted cerumen. No mastoid tenderness. Tympanic membrane is not injected, scarred or perforated.     Nose: Nose normal.  Eyes:     Conjunctiva/sclera: Conjunctivae normal.     Pupils: Pupils are equal, round, and reactive to light.  Cardiovascular:     Rate and Rhythm: Normal rate and regular rhythm.     Pulses: Normal pulses.     Heart sounds: Normal heart sounds.  Pulmonary:     Effort: Pulmonary effort is normal.     Breath sounds: Normal breath sounds.  Abdominal:     General: Abdomen is flat. Bowel sounds are normal.     Tenderness: There is no abdominal tenderness.  There is no guarding.  Musculoskeletal:        General: Normal range of motion.     Cervical back: Normal range of motion and neck supple.  Skin:    General: Skin is warm and dry.     Capillary Refill: Capillary refill takes less than 2 seconds.  Neurological:     General: No focal deficit present.     Mental Status: She is alert and oriented to person, place, and time.  Psychiatric:        Mood and Affect: Mood normal.        Behavior: Behavior normal.     ED Results / Procedures / Treatments   Labs (all labs ordered are listed, but only abnormal results are displayed) Labs Reviewed - No data to display  EKG None  Radiology No results found.  Procedures Procedures (including critical care time)  Medications Ordered in ED Medications  loratadine (CLARITIN) tablet 10 mg (has no administration in time range)  oxymetazoline (AFRIN) 0.05 % nasal spray 1 spray (has no administration in time range)  acetaminophen (TYLENOL) tablet 1,000 mg (has no administration in time range)    ED Course  I have reviewed the triage vital signs and the nursing notes.  Pertinent labs & imaging results that were available during my care of the patient were reviewed by me and considered in my medical decision making (see chart for details).    Likely Eustachian tube dysfunction as source of pain.  There is no infection at this time.  Patient is congested and this is likely due to seasonal allergies.  Patient is refusing covid testing at time.  Will treat symptomatically and have advised follow up with symptoms persist or progress.    Sherry Schmidt was evaluated in Emergency Department on 08/06/2019 for the symptoms described in the history of present illness. She was evaluated in the context of the global COVID-19 pandemic, which necessitated consideration that the patient might be at risk for infection with the SARS-CoV-2 virus that causes COVID-19. Institutional protocols and algorithms that  pertain to the evaluation of patients at risk for COVID-19 are in a state of rapid change based on information released by regulatory bodies including the CDC and federal and state organizations. These policies and algorithms were followed during the patient's care in the ED.  Final Clinical Impression(s) / ED Diagnoses  Return for intractable cough, coughing up blood,fevers >100.4 unrelieved by medication, shortness of breath, intractable vomiting, chest pain, shortness of breath, weakness,numbness, changes in speech, facial asymmetry,abdominal pain, passing out,Inability to tolerate liquids or food, cough, altered mental status or any concerns. No signs of systemic illness or infection. The patient is nontoxic-appearing on exam and vital signs are within normal limits.   I have reviewed the triage  vital signs and the nursing notes. Pertinent labs &imaging results that were available during my care of the patient were reviewed by me and considered in my medical decision making (see chart for details).After history, exam, and medical workup I feel the patient has beenappropriately medically screened and is safe for discharge home. Pertinent diagnoses were discussed with the patient. Patient was given return precautions.  Rx / DC Orders ED Discharge Orders         Ordered    fluticasone (FLONASE) 50 MCG/ACT nasal spray  Daily     08/06/19 0442    cetirizine-pseudoephedrine (ZYRTEC-D) 5-120 MG tablet  2 times daily     08/06/19 0442           Yasenia Reedy, MD 08/06/19 0448

## 2019-08-08 DIAGNOSIS — Z03818 Encounter for observation for suspected exposure to other biological agents ruled out: Secondary | ICD-10-CM | POA: Diagnosis not present

## 2019-08-08 DIAGNOSIS — Z20828 Contact with and (suspected) exposure to other viral communicable diseases: Secondary | ICD-10-CM | POA: Diagnosis not present

## 2019-08-24 DIAGNOSIS — E890 Postprocedural hypothyroidism: Secondary | ICD-10-CM | POA: Diagnosis not present

## 2019-08-24 DIAGNOSIS — J302 Other seasonal allergic rhinitis: Secondary | ICD-10-CM | POA: Diagnosis not present

## 2019-10-02 ENCOUNTER — Telehealth (HOSPITAL_COMMUNITY): Payer: Self-pay

## 2019-10-02 ENCOUNTER — Ambulatory Visit (HOSPITAL_COMMUNITY)
Admission: EM | Admit: 2019-10-02 | Discharge: 2019-10-02 | Disposition: A | Discharge status: Home / Self Care | Payer: BC Managed Care – PPO | Attending: Family Medicine | Admitting: Family Medicine

## 2019-10-02 ENCOUNTER — Other Ambulatory Visit: Payer: Self-pay

## 2019-10-02 ENCOUNTER — Encounter (HOSPITAL_COMMUNITY): Payer: Self-pay

## 2019-10-02 DIAGNOSIS — L259 Unspecified contact dermatitis, unspecified cause: Secondary | ICD-10-CM

## 2019-10-02 MED ORDER — PREDNISONE 10 MG (21) PO TBPK: ORAL_TABLET | Freq: Every day | ORAL | 0 refills | Status: DC

## 2019-10-02 NOTE — ED Provider Notes (Signed)
[place]   [id number] 10/02/19 Arrival Time: 3354  ASSESSMENT & PLAN:  1. Contact dermatitis, unspecified contact dermatitis type, unspecified trigger     Unknown trigger.  Begin: Meds ordered this encounter  Medications  . predniSONE (STERAPRED UNI-PAK 21 TAB) 10 MG (21) TBPK tablet    Sig: Take by mouth daily. Take as directed.    Dispense:  21 tablet    Refill:  0   Benadryl if needed.  Will follow up with PCP or here if worsening or failing to improve as anticipated. Reviewed expectations re: course of current medical issues. Questions answered. Outlined signs and symptoms indicating need for more acute intervention. Patient verbalized understanding. After Visit Summary given.   SUBJECTIVE:  Sherry Schmidt is a 24 y.o. female who presents with a skin complaint. Rash on face and neck; abrupt onset; yesterday; significant itching; questions from cats; no swallowing or resp difficulties; benadryl without relief; afebrile.    OBJECTIVE: Vitals:   10/02/19 1039  BP: 120/84  Pulse: 77  Resp: 16  Temp: 98.3 F (36.8 C)  TempSrc: Oral  SpO2: 98%  Weight: (!) 140.6 kg  Height: 6\' 1"  (1.854 m)    General appearance: alert; no distress HEENT: [place]; AT Neck: supple with FROM Lungs: clear to auscultation bilaterally Heart: regular rate and rhythm Extremities: no edema; moves all extremities normally Skin: warm and dry; signs of infection: no; diffuse and confluent irritated/erythematous skin of face and neck; no significant swelling Psychological: alert and cooperative; normal mood and affect  No Known Allergies  Past Medical History:  Diagnosis Date  . Asthma   . Back pain   . Panic attacks   . Thyroid disease    Social History   Socioeconomic History  . Marital status: Single    Spouse name: Not on file  . Number of children: Not on file  . Years of education: Not on file  . Highest education level: Not on file  Occupational History  .  Not on file  Tobacco Use  . Smoking status: Never Smoker  . Smokeless tobacco: Never Used  Vaping Use  . Vaping Use: Never used  Substance and Sexual Activity  . Alcohol use: Yes    Comment: occasional  . Drug use: No  . Sexual activity: Not on file  Other Topics Concern  . Not on file  Social History Narrative  . Not on file   Social Determinants of Health   Financial Resource Strain:   . Difficulty of Paying Living Expenses:   Food Insecurity:   . Worried About Charity fundraiser in the Last Year:   . Arboriculturist in the Last Year:   Transportation Needs:   . Film/video editor (Medical):   Marland Kitchen Lack of Transportation (Non-Medical):   Physical Activity:   . Days of Exercise per Week:   . Minutes of Exercise per Session:   Stress:   . Feeling of Stress :   Social Connections:   . Frequency of Communication with Friends and Family:   . Frequency of Social Gatherings with Friends and Family:   . Attends Religious Services:   . Active Member of Clubs or Organizations:   . Attends Archivist Meetings:   Marland Kitchen Marital Status:   Intimate Partner Violence:   . Fear of Current or Ex-Partner:   . Emotionally Abused:   Marland Kitchen Physically Abused:   . Sexually Abused:    No family history on file. Past  Surgical History:  Procedure Laterality Date  . Lanier Clam, MD 10/02/19 1051

## 2019-10-02 NOTE — ED Triage Notes (Signed)
Pt c/o rash on face and neck since yesterday. Pt denies new products are meds. 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 She thought it was an allergic reaction to her cats and she took benadryl yesterday and today, but it hasn't done anything. Pt has Pt has tiny clusters of red raised bumps near mouth and chin and on anterior of neck. Pt's face and anterior of neck is erythematous. Pt states it itches.

## 2019-10-22 DIAGNOSIS — Z6841 Body Mass Index (BMI) 40.0 and over, adult: Secondary | ICD-10-CM | POA: Diagnosis not present

## 2019-10-22 DIAGNOSIS — J039 Acute tonsillitis, unspecified: Secondary | ICD-10-CM | POA: Diagnosis not present

## 2019-11-10 ENCOUNTER — Encounter (INDEPENDENT_AMBULATORY_CARE_PROVIDER_SITE_OTHER): Payer: Self-pay | Admitting: Family Medicine

## 2019-11-10 ENCOUNTER — Other Ambulatory Visit: Payer: Self-pay

## 2019-11-10 ENCOUNTER — Ambulatory Visit (INDEPENDENT_AMBULATORY_CARE_PROVIDER_SITE_OTHER): Payer: BC Managed Care – PPO | Admitting: Family Medicine

## 2019-11-10 VITALS — BP 123/85 | HR 67 | Temp 98.3°F | Ht 73.0 in | Wt 312.0 lb

## 2019-11-10 DIAGNOSIS — R0602 Shortness of breath: Secondary | ICD-10-CM

## 2019-11-10 DIAGNOSIS — Z9189 Other specified personal risk factors, not elsewhere classified: Secondary | ICD-10-CM

## 2019-11-10 DIAGNOSIS — F3289 Other specified depressive episodes: Secondary | ICD-10-CM

## 2019-11-10 DIAGNOSIS — E038 Other specified hypothyroidism: Secondary | ICD-10-CM

## 2019-11-10 DIAGNOSIS — Z6841 Body Mass Index (BMI) 40.0 and over, adult: Secondary | ICD-10-CM

## 2019-11-10 DIAGNOSIS — E282 Polycystic ovarian syndrome: Secondary | ICD-10-CM

## 2019-11-10 DIAGNOSIS — R5383 Other fatigue: Secondary | ICD-10-CM | POA: Diagnosis not present

## 2019-11-10 DIAGNOSIS — Z0289 Encounter for other administrative examinations: Secondary | ICD-10-CM

## 2019-11-10 MED ORDER — SERTRALINE HCL 25 MG PO TABS: 25.0000 mg | ORAL_TABLET | Freq: Every day | ORAL | 0 refills | Status: DC

## 2019-11-11 LAB — CBC WITH DIFFERENTIAL/PLATELET
Basophils Absolute: 0.1 10*3/uL (ref 0.0–0.2)
Basos: 1 %
EOS (ABSOLUTE): 0.3 10*3/uL (ref 0.0–0.4)
Eos: 2 %
Hematocrit: 47.8 % — ABNORMAL HIGH (ref 34.0–46.6)
Hemoglobin: 15.5 g/dL (ref 11.1–15.9)
Immature Grans (Abs): 0.1 10*3/uL (ref 0.0–0.1)
Immature Granulocytes: 1 %
Lymphocytes Absolute: 3.7 10*3/uL — ABNORMAL HIGH (ref 0.7–3.1)
Lymphs: 29 %
MCH: 29.2 pg (ref 26.6–33.0)
MCHC: 32.4 g/dL (ref 31.5–35.7)
MCV: 90 fL (ref 79–97)
Monocytes Absolute: 0.6 10*3/uL (ref 0.1–0.9)
Monocytes: 4 %
Neutrophils Absolute: 8.1 10*3/uL — ABNORMAL HIGH (ref 1.4–7.0)
Neutrophils: 63 %
Platelets: 300 10*3/uL (ref 150–450)
RBC: 5.31 x10E6/uL — ABNORMAL HIGH (ref 3.77–5.28)
RDW: 13.6 % (ref 11.7–15.4)
WBC: 12.8 10*3/uL — ABNORMAL HIGH (ref 3.4–10.8)

## 2019-11-11 LAB — COMPREHENSIVE METABOLIC PANEL
ALT: 35 IU/L — ABNORMAL HIGH (ref 0–32)
AST: 27 IU/L (ref 0–40)
Albumin/Globulin Ratio: 1.3 (ref 1.2–2.2)
Albumin: 4.4 g/dL (ref 3.9–5.0)
Alkaline Phosphatase: 92 IU/L (ref 48–121)
BUN/Creatinine Ratio: 12 (ref 9–23)
BUN: 8 mg/dL (ref 6–20)
Bilirubin Total: 0.3 mg/dL (ref 0.0–1.2)
CO2: 20 mmol/L (ref 20–29)
Calcium: 9.5 mg/dL (ref 8.7–10.2)
Chloride: 103 mmol/L (ref 96–106)
Creatinine, Ser: 0.66 mg/dL (ref 0.57–1.00)
GFR calc Af Amer: 144 mL/min/{1.73_m2} (ref >59)
GFR calc non Af Amer: 125 mL/min/{1.73_m2} (ref >59)
Globulin, Total: 3.3 g/dL (ref 1.5–4.5)
Glucose: 79 mg/dL (ref 65–99)
Potassium: 4.3 mmol/L (ref 3.5–5.2)
Sodium: 141 mmol/L (ref 134–144)
Total Protein: 7.7 g/dL (ref 6.0–8.5)

## 2019-11-11 LAB — LIPID PANEL WITH LDL/HDL RATIO
Cholesterol, Total: 218 mg/dL — ABNORMAL HIGH (ref 100–199)
HDL: 47 mg/dL (ref >39)
LDL Chol Calc (NIH): 125 mg/dL — ABNORMAL HIGH (ref 0–99)
LDL/HDL Ratio: 2.7 ratio (ref 0.0–3.2)
Triglycerides: 263 mg/dL — ABNORMAL HIGH (ref 0–149)
VLDL Cholesterol Cal: 46 mg/dL — ABNORMAL HIGH (ref 5–40)

## 2019-11-11 LAB — T3: T3, Total: 95 ng/dL (ref 71–180)

## 2019-11-11 LAB — T4: T4, Total: 6.4 ug/dL (ref 4.5–12.0)

## 2019-11-11 LAB — INSULIN, RANDOM: INSULIN: 29.3 u[IU]/mL — ABNORMAL HIGH (ref 2.6–24.9)

## 2019-11-11 LAB — HEMOGLOBIN A1C
Est. average glucose Bld gHb Est-mCnc: 114 mg/dL
Hgb A1c MFr Bld: 5.6 % (ref 4.8–5.6)

## 2019-11-11 LAB — VITAMIN B12: Vitamin B-12: 417 pg/mL (ref 232–1245)

## 2019-11-11 LAB — FOLATE: Folate: 14.9 ng/mL (ref >3.0)

## 2019-11-11 LAB — VITAMIN D 25 HYDROXY (VIT D DEFICIENCY, FRACTURES): Vit D, 25-Hydroxy: 18.8 ng/mL — ABNORMAL LOW (ref 30.0–100.0)

## 2019-11-11 LAB — TSH: TSH: 6.29 u[IU]/mL — ABNORMAL HIGH (ref 0.450–4.500)

## 2019-11-16 NOTE — Progress Notes (Signed)
Dear Sherry Rota, NP,   Thank you for referring Sherry Schmidt to our clinic. The following note includes my evaluation and treatment recommendations.  Chief Complaint:   OBESITY Sherry Schmidt (MR# [id number]) is a 24 y.o. female who presents for evaluation and treatment of obesity and related comorbidities. Current BMI is Body mass index is 41.16 kg/m. Sherry Schmidt has been struggling with her weight for many years and has been unsuccessful in either losing weight, maintaining weight loss, or reaching her healthy weight goal.  Sherry Schmidt eats at noon-crackers with cheese, 6 pack, and tuna pack. Afternoon snack-queso chips or Doritos 1/3 bag. She notes occasional hunger. Dinner-chicken 6 oz, broccoli 1.5 cups, or [employer] 1 footlong [place] BMI, eats all and feels full.  Sherry Schmidt is currently in the action stage of change and ready to dedicate time achieving and maintaining a healthier weight. Sherry Schmidt is interested in becoming our patient and working on intensive lifestyle modifications including (but not limited to) diet and exercise for weight loss.  Sherry Schmidt's habits were reviewed today and are as follows: Her family eats meals together, her desired weight loss is 122 lbs, she has been heavy most of her life, she started gaining weight when her relationship started, her heaviest weight ever was 317 pounds, she has significant food cravings issues, she skips meals frequently, she is frequently drinking liquids with calories, she frequently makes poor food choices, she has problems with excessive hunger, she frequently eats larger portions than normal, she has binge eating behaviors and she struggles with emotional eating.  Depression Screen Sherry Schmidt Food and Mood (modified PHQ-9) score was 21.  Depression screen PHQ 2/9 11/10/2019  Decreased Interest 3  Down, Depressed, Hopeless 3  PHQ - 2 Score 6  Altered sleeping 1  Tired, decreased energy 2  Change in appetite 2    Feeling bad or failure about yourself  3  Trouble concentrating 3  Moving slowly or fidgety/restless 1  Suicidal thoughts 3  PHQ-9 Score 21  Difficult doing work/chores Very difficult   Subjective:   1. Other fatigue Sherry Schmidt admits to daytime somnolence and admits to waking up still tired. Patent has a history of symptoms of daytime fatigue. Sherry Schmidt generally gets 5 hours of sleep per night, and states that she has difficulty falling asleep. Snoring is present. Apneic episodes are not present. Epworth Sleepiness Score is 7. EKG-normal sinus rhythm.  2. SOB (shortness of breath) on exertion [name] notes increasing shortness of breath with exercising and seems to be worsening over time with weight gain. She notes getting out of breath sooner with activity than she used to. This has not gotten worse recently. [name] denies shortness of breath at rest or orthopnea.  3. Other specified hypothyroidism Sherry Schmidt has a history of thyroidectomy. She denies cold/heat intolerance or palpitations.  4. PCOS (polycystic ovarian syndrome) [name] denies a history of polycystic ovarian syndrome, but she was diagnosis is likely. She has irregular periods and facial hair growth.  5. Other depression, with emotional eating Sherry Schmidt notes her symptoms have worsened previously with the death of her cousin and friend. She denies suicidal ideas or homicidal ideas. She notes emotional eating due to boredom. She reports change in interest, energy, concentration, and appetite.  6. At risk for depression Sherry Schmidt is at elevated risk of depression due to one or more of the following: family history, significant life stressors, medical conditions and/or poor nutrition. No previous diagnosis and not on medications currently.  Assessment/Plan:   1. Other fatigue  Sherry Schmidt does feel that her weight is causing her energy to be lower than it should be. Fatigue may be related to obesity, depression or  many other causes. Labs will be ordered, and in the meanwhile, [name] will focus on self care including making healthy food choices, increasing physical activity and focusing on stress reduction.  - EKG 12-Lead - Comprehensive metabolic panel - CBC with Differential/Platelet - Lipid Panel With LDL/HDL Ratio - VITAMIN D 25 Hydroxy (Vit-D Deficiency, Fractures) - Vitamin B12 - Folate  2. SOB (shortness of breath) on exertion Sherry Schmidt does feel that she gets out of breath more easily that she used to when she exercises. Sherry Schmidt's shortness of breath appears to be obesity related and exercise induced. She has agreed to work on weight loss and gradually increase exercise to treat her exercise induced shortness of breath. Will continue to monitor closely.  - Comprehensive metabolic panel - CBC with Differential/Platelet - Lipid Panel With LDL/HDL Ratio - VITAMIN D 25 Hydroxy (Vit-D Deficiency, Fractures) - Vitamin B12 - Folate  3. Other specified hypothyroidism Patient with long-standing hypothyroidism. We will check labs today. Sherry Schmidt appears euthyroid. Orders and follow up as documented in patient record.  Counseling . Good thyroid control is important for overall health. Supratherapeutic thyroid levels are dangerous and will not improve weight loss results. . The correct way to take levothyroxine is fasting, with water, separated by at least 30 minutes from breakfast, and separated by more than 4 hours from calcium, iron, multivitamins, acid reflux medications (PPIs).   - T3 - T4 - TSH  4. PCOS (polycystic ovarian syndrome) Intensive lifestyle modifications are first line treatment for this issue. We discussed several lifestyle modifications today. Sherry Schmidt continue to work on diet, exercise and weight loss efforts. We will check labs today. Orders and follow up as documented in patient record.  Counseling . PCOS is a leading cause of menstrual irregularities and  infertility. It is also associated with obesity, hirsutism (excessive hair growth on the face, chest, or back), and cardiovascular risk factors such as high cholesterol and insulin resistance. . Insulin resistance appears to play a central role.  . Women with PCOS have been shown to have impaired appetite-regulating hormones. . Metformin is one medication that can improve metabolic parameters.  . Women with polycystic ovary syndrome (PCOS) have an increased risk for cardiovascular disease (CVD) - European Journal of Preventive Cardiology.  - Insulin, random - Hemoglobin A1c  5. Other depression, with emotional eating Behavior modification techniques were discussed today to help Sherry Schmidt deal with her emotional/non-hunger eating behaviors. Sherry Schmidt agreed to start Zoloft 25 mg PO daily with no refills. We will refer to Dr. Mallie Mussel, our Bariatric Psychologist for evaluation. Orders and follow up as documented in patient record.   - sertraline (ZOLOFT) 25 MG tablet; Take 1 tablet (25 mg total) by mouth daily.  Dispense: 30 tablet; Refill: 0  6. At risk for depression Sherry Schmidt was given approximately 15 minutes of depression risk counseling today. She has risk factors for depression. We discussed the importance of a healthy work life balance, a healthy relationship with food and a good support system.  Repetitive spaced learning was employed today to elicit superior memory formation and behavioral change.  7. Class 3 severe obesity with serious comorbidity and body mass index (BMI) of 40.0 to 44.9 in adult, unspecified obesity type ([place]) Sherry Schmidt is currently in the action stage of change and her goal is to continue with weight loss efforts. I recommend Sherry Schmidt begin  the structured treatment plan as follows:  She has agreed to the Category 4 Plan.  Exercise goals: No exercise has been prescribed at this time.   Behavioral modification strategies: increasing lean protein intake, no skipping  meals, meal planning and cooking strategies and dealing with family or coworker sabotage.  She was informed of the importance of frequent follow-up visits to maximize her success with intensive lifestyle modifications for her multiple health conditions. She was informed we would discuss her lab results at her next visit unless there is a critical issue that needs to be addressed sooner. Sherry Schmidt agreed to keep her next visit at the agreed upon time to discuss these results.  Objective:   Blood pressure 123/85, pulse 67, temperature 98.3 F (36.8 C), temperature source Oral, height 6\' 1"  (1.854 m), weight (!) 312 lb (141.5 kg), last menstrual period 10/11/2019, SpO2 96 %. Body mass index is 41.16 kg/m.  EKG: Normal sinus rhythm, rate 69 BPM.  Indirect Calorimeter completed today shows a VO2 of 432 and a REE of 3010.  Her calculated basal metabolic rate is 6812 thus her basal metabolic rate is better than expected.  + hirsutism.  General: Cooperative, alert, well developed, in no acute distress. HEENT: Conjunctivae and lids unremarkable. Cardiovascular: Regular rhythm.  Lungs: Normal work of breathing. Neurologic: No focal deficits.   Lab Results  Component Value Date   CREATININE 0.66 11/10/2019   BUN 8 11/10/2019   NA 141 11/10/2019   K 4.3 11/10/2019   CL 103 11/10/2019   CO2 20 11/10/2019   Lab Results  Component Value Date   ALT 35 (H) 11/10/2019   AST 27 11/10/2019   ALKPHOS 92 11/10/2019   BILITOT 0.3 11/10/2019   Lab Results  Component Value Date   HGBA1C 5.6 11/10/2019   Lab Results  Component Value Date   INSULIN 29.3 (H) 11/10/2019   Lab Results  Component Value Date   TSH 6.290 (H) 11/10/2019   Lab Results  Component Value Date   CHOL 218 (H) 11/10/2019   HDL 47 11/10/2019   LDLCALC 125 (H) 11/10/2019   TRIG 263 (H) 11/10/2019   Lab Results  Component Value Date   WBC 12.8 (H) 11/10/2019   HGB 15.5 11/10/2019   HCT 47.8 (H) 11/10/2019   MCV 90  11/10/2019   PLT 300 11/10/2019   No results found for: IRON, TIBC, FERRITIN  Attestation Statements:   Reviewed by clinician on day of visit: allergies, medications, problem list, medical history, surgical history, family history, social history, and previous encounter notes.   I, Trixie Dredge, am acting as transcriptionist for Coralie Common, MD.  This is the patient's first visit at [facility]. The patient's NEW PATIENT PACKET was reviewed at length. Included in the packet: current and past health history, medications, allergies, ROS, gynecologic history (women only), surgical history, family history, social history, weight history, weight loss surgery history (for those that have had weight loss surgery), nutritional evaluation, mood and food questionnaire, PHQ9, Epworth questionnaire, sleep habits questionnaire, patient life and health improvement goals questionnaire. These will all be scanned into the patient's chart under media.   During the visit, I independently reviewed the patient's EKG, bioimpedance scale results, and indirect calorimeter results. I used this information to tailor a meal plan for the patient that will help her to lose weight and will improve her obesity-related conditions going forward. I performed a medically necessary appropriate examination and/or evaluation. I discussed the assessment and treatment  plan with the patient. The patient was provided an opportunity to ask questions and all were answered. The patient agreed with the plan and demonstrated an understanding of the instructions. Labs were ordered at this visit and will be reviewed at the next visit unless more critical results need to be addressed immediately. Clinical information was updated and documented in the EMR.   Time spent on visit including pre-visit chart review and post-visit care was 45 minutes.   A separate 15 minutes was spent on risk counseling (see above).  I have  reviewed the above documentation for accuracy and completeness, and I agree with the above. - Jinny Blossom, MD

## 2019-11-24 ENCOUNTER — Ambulatory Visit (INDEPENDENT_AMBULATORY_CARE_PROVIDER_SITE_OTHER): Payer: BC Managed Care – PPO | Admitting: Family Medicine

## 2019-11-24 ENCOUNTER — Other Ambulatory Visit: Payer: Self-pay

## 2019-11-24 ENCOUNTER — Encounter (INDEPENDENT_AMBULATORY_CARE_PROVIDER_SITE_OTHER): Payer: Self-pay | Admitting: Family Medicine

## 2019-11-24 VITALS — BP 119/76 | HR 69 | Temp 97.3°F | Ht 73.0 in | Wt 311.0 lb

## 2019-11-24 DIAGNOSIS — Z9189 Other specified personal risk factors, not elsewhere classified: Secondary | ICD-10-CM

## 2019-11-24 DIAGNOSIS — R7989 Other specified abnormal findings of blood chemistry: Secondary | ICD-10-CM

## 2019-11-24 DIAGNOSIS — Z6841 Body Mass Index (BMI) 40.0 and over, adult: Secondary | ICD-10-CM

## 2019-11-24 DIAGNOSIS — R7401 Elevation of levels of liver transaminase levels: Secondary | ICD-10-CM | POA: Diagnosis not present

## 2019-11-24 DIAGNOSIS — E559 Vitamin D deficiency, unspecified: Secondary | ICD-10-CM

## 2019-11-24 DIAGNOSIS — E8881 Metabolic syndrome: Secondary | ICD-10-CM | POA: Diagnosis not present

## 2019-11-24 MED ORDER — VITAMIN D (ERGOCALCIFEROL) 1.25 MG (50000 UNIT) PO CAPS: 50000.0000 [IU] | ORAL_CAPSULE | ORAL | 0 refills | Status: DC

## 2019-11-25 NOTE — Progress Notes (Signed)
Chief Complaint:   OBESITY Sherry Schmidt is here to discuss her progress with her obesity treatment plan along with follow-up of her obesity related diagnoses. Sherry Schmidt is on the Category 4 Plan and states she is following her eating plan approximately 50% of the time. Sherry Schmidt states she is at the gym for 60 minutes 3-4 times per week.  Today's visit was #: 2 Starting weight: 312 lbs Starting date: 11/10/2019 Today's weight: 311 lbs Today's date: 11/24/2019 Total lbs lost to date: 1 Total lbs lost since last in-office visit: 1  Interim History: Sherry Schmidt found that getting all the food in was difficult for breakfast. She was able to get in eggs, and toast and sausage. Lunch and dinner she was able to get all of the food in. Snacks, she did berries or fruit. She notes some cravings for chips. She notes the consistency of getting in food may be difficult in the next few weeks.  Subjective:   1. Insulin resistance Sherry Schmidt's last A1c was 5.6 and insulin 29.3. She notes some carbohydrate cravings but minimal indulgences. I discussed labs with the patient today.  2. Abnormal thyroid blood test Sherry Schmidt's last TSH was 6.29, T3 95, and T4 6.4. She denies a history of hypothyroidism. I discussed labs with the patient today.  3. Vitamin D deficiency Sherry Schmidt has a new diagnosis of Vit D deficiency. She is not on Vit D, and she notes fatigue. I discussed labs with the patient today.  4. Transaminitis Sherry Schmidt's last ALT was 35, AST 27, and Alk phos 92. She denies a history of elevated LFTs.  5. At risk for diabetes mellitus Sherry Schmidt is at higher than average risk for developing diabetes due to her obesity.   Assessment/Plan:   1. Insulin resistance Sherry Schmidt will continue to work on weight loss, exercise, and decreasing simple carbohydrates to help decrease the risk of diabetes. We will repeat labs in 3 months. Sherry Schmidt agreed to follow-up with [place] as directed to closely monitor  her progress.  2. Abnormal thyroid blood test We will repeat TSH at her next appointment. Orders and follow up as documented in patient record.  Counseling . Good thyroid control is important for overall health. Supratherapeutic thyroid levels are dangerous and will not improve weight loss results. . Counseling: The correct way to take levothyroxine is fasting, with water, separated by at least 30 minutes from breakfast, and separated by more than 4 hours from calcium, iron, multivitamins, acid reflux medications (PPIs).   3. Vitamin D deficiency Low Vitamin D level contributes to fatigue and are associated with obesity, breast, and colon cancer. Sherry Schmidt agreed to start prescription Vitamin D 50,000 IU every week with no refills. She will follow-up for routine testing of Vitamin D, at least 2-3 times per year to avoid over-replacement.  - Vitamin D, Ergocalciferol, (DRISDOL) 1.25 MG (50000 UNIT) CAPS capsule; Take 1 capsule (50,000 Units total) by mouth every 7 (seven) days.  Dispense: 4 capsule; Refill: 0  4. Transaminitis Sherry Schmidt was educated the importance of weight loss. Sherry Schmidt agreed to continue with her weight loss efforts with healthier diet and exercise as an essential part of her treatment plan. We will repeat CMP in 3 months.  5. At risk for diabetes mellitus Sherry Schmidt was given approximately 30 minutes of diabetes education and counseling today. We discussed intensive lifestyle modifications today with an emphasis on weight loss as well as increasing exercise and decreasing simple carbohydrates in her diet. We also reviewed medication options with an emphasis  on risk versus benefit of those discussed.   Repetitive spaced learning was employed today to elicit superior memory formation and behavioral change.  6. Class 3 severe obesity with serious comorbidity and body mass index (BMI) of 40.0 to 44.9 in adult, unspecified obesity type ([place]) Sherry Schmidt is currently in the action  stage of change. As such, her goal is to continue with weight loss efforts. She has agreed to the Category 4 Plan.   Exercise goals: All adults should avoid inactivity. Some physical activity is better than none, and adults who participate in any amount of physical activity gain some health benefits.  Behavioral modification strategies: increasing lean protein intake, no skipping meals and meal planning and cooking strategies.  Sherry Schmidt has agreed to follow-up with our clinic in 2 weeks. She was informed of the importance of frequent follow-up visits to maximize her success with intensive lifestyle modifications for her multiple health conditions.   Objective:   Blood pressure 119/76, pulse 69, temperature (!) 97.3 F (36.3 C), temperature source Oral, height _0  (1.854 m), weight (!) 311 lb (141.1 kg), SpO2 99 %. Body mass index is 41.03 kg/m.  General: Cooperative, alert, well developed, in no acute distress. HEENT: Conjunctivae and lids unremarkable. Cardiovascular: Regular rhythm.  Lungs: Normal work of breathing. Neurologic: No focal deficits.   Lab Results  Component Value Date   CREATININE 0.66 11/10/2019   BUN 8 11/10/2019   NA 141 11/10/2019   K 4.3 11/10/2019   CL 103 11/10/2019   CO2 20 11/10/2019   Lab Results  Component Value Date   ALT 35 (H) 11/10/2019   AST 27 11/10/2019   ALKPHOS 92 11/10/2019   BILITOT 0.3 11/10/2019   Lab Results  Component Value Date   HGBA1C 5.6 11/10/2019   Lab Results  Component Value Date   INSULIN 29.3 (H) 11/10/2019   Lab Results  Component Value Date   TSH 6.290 (H) 11/10/2019   Lab Results  Component Value Date   CHOL 218 (H) 11/10/2019   HDL 47 11/10/2019   LDLCALC 125 (H) 11/10/2019   TRIG 263 (H) 11/10/2019   Lab Results  Component Value Date   WBC 12.8 (H) 11/10/2019   HGB 15.5 11/10/2019   HCT 47.8 (H) 11/10/2019   MCV 90 11/10/2019   PLT 300 11/10/2019   No results found for: IRON, TIBC,  FERRITIN  Attestation Statements:   Reviewed by clinician on day of visit: allergies, medications, problem list, medical history, surgical history, family history, social history, and previous encounter notes.   I, Trixie Dredge, am acting as transcriptionist for Coralie Common, MD.  I have reviewed the above documentation for accuracy and completeness, and I agree with the above. - Jinny Blossom, MD

## 2019-12-08 ENCOUNTER — Other Ambulatory Visit (INDEPENDENT_AMBULATORY_CARE_PROVIDER_SITE_OTHER): Payer: Self-pay | Admitting: Family Medicine

## 2019-12-08 ENCOUNTER — Encounter (INDEPENDENT_AMBULATORY_CARE_PROVIDER_SITE_OTHER): Payer: Self-pay | Admitting: Family Medicine

## 2019-12-08 DIAGNOSIS — F3289 Other specified depressive episodes: Secondary | ICD-10-CM

## 2019-12-09 ENCOUNTER — Ambulatory Visit (INDEPENDENT_AMBULATORY_CARE_PROVIDER_SITE_OTHER): Payer: BC Managed Care – PPO | Admitting: Family Medicine

## 2019-12-14 ENCOUNTER — Encounter (INDEPENDENT_AMBULATORY_CARE_PROVIDER_SITE_OTHER): Payer: Self-pay | Admitting: Family Medicine

## 2019-12-14 ENCOUNTER — Ambulatory Visit (INDEPENDENT_AMBULATORY_CARE_PROVIDER_SITE_OTHER): Payer: BC Managed Care – PPO | Admitting: Family Medicine

## 2019-12-14 ENCOUNTER — Other Ambulatory Visit: Payer: Self-pay

## 2019-12-14 VITALS — BP 111/75 | HR 75 | Temp 98.0°F | Ht 73.0 in | Wt 309.0 lb

## 2019-12-14 DIAGNOSIS — Z6841 Body Mass Index (BMI) 40.0 and over, adult: Secondary | ICD-10-CM

## 2019-12-14 DIAGNOSIS — F418 Other specified anxiety disorders: Secondary | ICD-10-CM | POA: Diagnosis not present

## 2019-12-14 DIAGNOSIS — E8881 Metabolic syndrome: Secondary | ICD-10-CM | POA: Diagnosis not present

## 2019-12-14 DIAGNOSIS — Z9189 Other specified personal risk factors, not elsewhere classified: Secondary | ICD-10-CM | POA: Diagnosis not present

## 2019-12-14 MED ORDER — SERTRALINE HCL 25 MG PO TABS: 50.0000 mg | ORAL_TABLET | Freq: Every day | ORAL | 0 refills | Status: DC

## 2019-12-15 NOTE — Progress Notes (Signed)
Chief Complaint:   OBESITY Sherry Schmidt is here to discuss her progress with her obesity treatment plan along with follow-up of her obesity related diagnoses. Sherry Schmidt is on the Category 4 Plan and states she is following her eating plan approximately 85% of the time. Sherry Schmidt states she is at the gym for 90 minutes 3-4 times per week.  Today's visit was #: 3 Starting weight: 312 lbs Starting date: 11/10/2019 Today's weight: 309 lbs Today's date: 12/14/2019 Total lbs lost to date: 3 Total lbs lost since last in-office visit: 2  Interim History: Sherry Schmidt has done pretty good following the meal plan. She has been doing egg breakfast option and then the sandwich option for lunch. Dinner has been chicken and broccoli (10oz and 1.5 cup veg). She hasn't been using 300 snack calories daily. She notes not as much cravings as before.  Subjective:   1. Insulin resistance Sherry Schmidt notes occasional cravings for salty. She is not on medications.  2. Depression with anxiety Sherry Schmidt felt some difference (more when she started than now). She denies suicidal ideas or homicidal ideas.  3. At risk for deficient intake of food The patient is at a higher than average risk of deficient intake of food due to not always getting all calories in.  Assessment/Plan:   1. Insulin resistance Sherry Schmidt will continue to work on weight loss, exercise, and decreasing simple carbohydrates to help decrease the risk of diabetes. We will repeat labs in 3 months. Sherry Schmidt agreed to follow-up with [place] as directed to closely monitor her progress.  2. Depression with anxiety Behavior modification techniques were discussed today to help Sherry Schmidt deal with her anxiety and depression. Michille agreed to increase sertraline to 50 mg PO daily with no refills. Orders and follow up as documented in patient record.   - sertraline (ZOLOFT) 25 MG tablet; Take 2 tablets (50 mg total) by mouth daily.  Dispense: 30 tablet;  Refill: 0  3. At risk for deficient intake of food Careen was given approximately 15 minutes of deficit intake of food prevention counseling today. Sherry Schmidt is at risk for eating too few calories based on current food recall. She was encouraged to focus on meeting caloric and protein goals according to her recommended meal plan.   4. Class 3 severe obesity with serious comorbidity and body mass index (BMI) of 40.0 to 44.9 in adult, unspecified obesity type (Sherry Schmidt) Sherry Schmidt is currently in the action stage of change. As such, her goal is to continue with weight loss efforts. She has agreed to the Category 4 Plan.   Exercise goals: As is.  Behavioral modification strategies: increasing lean protein intake, meal planning and cooking strategies, keeping healthy foods in the home and planning for success.  Sherry Schmidt has agreed to follow-up with our clinic in 2 weeks. She was informed of the importance of frequent follow-up visits to maximize her success with intensive lifestyle modifications for her multiple health conditions.   Objective:   Blood pressure 111/75, pulse 75, temperature 98 F (36.7 C), temperature source Oral, height 6\' 1"  (1.854 m), weight (!) 309 lb (140.2 kg), SpO2 99 %. Body mass index is 40.77 kg/m.  General: Cooperative, alert, well developed, in no acute distress. HEENT: Conjunctivae and lids unremarkable. Cardiovascular: Regular rhythm.  Lungs: Normal work of breathing. Neurologic: No focal deficits.   Lab Results  Component Value Date   CREATININE 0.66 11/10/2019   BUN 8 11/10/2019   NA 141 11/10/2019   K 4.3 11/10/2019  CL 103 11/10/2019   CO2 20 11/10/2019   Lab Results  Component Value Date   ALT 35 (H) 11/10/2019   AST 27 11/10/2019   ALKPHOS 92 11/10/2019   BILITOT 0.3 11/10/2019   Lab Results  Component Value Date   HGBA1C 5.6 11/10/2019   Lab Results  Component Value Date   INSULIN 29.3 (H) 11/10/2019   Lab Results  Component Value  Date   TSH 6.290 (H) 11/10/2019   Lab Results  Component Value Date   CHOL 218 (H) 11/10/2019   HDL 47 11/10/2019   LDLCALC 125 (H) 11/10/2019   TRIG 263 (H) 11/10/2019   Lab Results  Component Value Date   WBC 12.8 (H) 11/10/2019   HGB 15.5 11/10/2019   HCT 47.8 (H) 11/10/2019   MCV 90 11/10/2019   PLT 300 11/10/2019   No results found for: IRON, TIBC, FERRITIN  Attestation Statements:   Reviewed by clinician on day of visit: allergies, medications, problem list, medical history, surgical history, family history, social history, and previous encounter notes.   I, Trixie Dredge, am acting as transcriptionist for Coralie Common, MD.  I have reviewed the above documentation for accuracy and completeness, and I agree with the above. - Jinny Blossom, MD

## 2019-12-28 ENCOUNTER — Ambulatory Visit (INDEPENDENT_AMBULATORY_CARE_PROVIDER_SITE_OTHER): Payer: BC Managed Care – PPO | Admitting: Family Medicine

## 2019-12-31 ENCOUNTER — Encounter (INDEPENDENT_AMBULATORY_CARE_PROVIDER_SITE_OTHER): Payer: Self-pay | Admitting: Family Medicine

## 2020-01-25 ENCOUNTER — Other Ambulatory Visit (INDEPENDENT_AMBULATORY_CARE_PROVIDER_SITE_OTHER): Payer: Self-pay | Admitting: Family Medicine

## 2020-01-25 DIAGNOSIS — F418 Other specified anxiety disorders: Secondary | ICD-10-CM

## 2020-01-25 MED ORDER — SERTRALINE HCL 25 MG PO TABS: 50.0000 mg | ORAL_TABLET | Freq: Every day | ORAL | 0 refills | Status: DC

## 2020-02-02 ENCOUNTER — Encounter (INDEPENDENT_AMBULATORY_CARE_PROVIDER_SITE_OTHER): Payer: Self-pay | Admitting: Family Medicine

## 2020-02-02 ENCOUNTER — Ambulatory Visit (INDEPENDENT_AMBULATORY_CARE_PROVIDER_SITE_OTHER): Payer: BC Managed Care – PPO | Admitting: Family Medicine

## 2020-02-02 ENCOUNTER — Other Ambulatory Visit: Payer: Self-pay

## 2020-02-02 VITALS — BP 117/74 | HR 78 | Temp 98.3°F | Ht 73.0 in | Wt 309.0 lb

## 2020-02-02 DIAGNOSIS — F418 Other specified anxiety disorders: Secondary | ICD-10-CM

## 2020-02-02 DIAGNOSIS — Z6841 Body Mass Index (BMI) 40.0 and over, adult: Secondary | ICD-10-CM

## 2020-02-02 DIAGNOSIS — E038 Other specified hypothyroidism: Secondary | ICD-10-CM

## 2020-02-03 NOTE — Progress Notes (Signed)
Chief Complaint:   OBESITY Sherry Schmidt is here to discuss her progress with her obesity treatment plan along with follow-up of her obesity related diagnoses. Sherry Schmidt is on the Category 4 Plan and states she is following her eating plan approximately 50% of the time. Sherry Schmidt states she is at the gym for 60 minutes 1-2 times per week.  Today's visit was #: 4 Starting weight: 312 lbs Starting date: 11/10/2019 Today's weight: 309 lbs Today's date: 02/02/2020 Total lbs lost to date: 3 Total lbs lost since last in-office visit: 0  Interim History: Sherry Schmidt voices she kindof fell off wagon in terms of the meal plan over the last few weeks. She wants to get back on the Category 4, and plans to go grocery shopping after today's appointment.   Subjective:   1. Other specified hypothyroidism Sherry Schmidt is on levothyroxine 100 mcg PO daily (managed by her primary care provider). Last TSH was 6.29.  2. Depression with anxiety Sherry Schmidt is on Zoloft 50 mg daily, and she denies suicidal or homicidal ideas.  Assessment/Plan:   1. Other specified hypothyroidism Patient with long-standing hypothyroidism, on levothyroxine therapy. She appears euthyroid. Sherry Schmidt will follow up with her primary care provider on November 15th. Orders and follow up as documented in patient record.  Counseling . Good thyroid control is important for overall health. Supratherapeutic thyroid levels are dangerous and will not improve weight loss results. . The correct way to take levothyroxine is fasting, with water, separated by at least 30 minutes from breakfast, and separated by more than 4 hours from calcium, iron, multivitamins, acid reflux medications (PPIs).   2. Depression with anxiety Behavior modification techniques were discussed today to help Sherry Schmidt deal with her emotional/non-hunger eating behaviors. Sherry Schmidt will continue Zoloft, no refill needed. Orders and follow up as documented in patient  record.   3. Class 3 severe obesity with serious comorbidity and body mass index (BMI) of 40.0 to 44.9 in adult, unspecified obesity type ([place]) Sherry Schmidt is currently in the action stage of change. As such, her goal is to continue with weight loss efforts. She has agreed to the Category 4 Plan.   Exercise goals: As is.  Behavioral modification strategies: increasing lean protein intake, decreasing eating out, no skipping meals and holiday eating strategies .  Sherry Schmidt has agreed to follow-up with our clinic in 2 weeks. She was informed of the importance of frequent follow-up visits to maximize her success with intensive lifestyle modifications for her multiple health conditions.   Objective:   Blood pressure 117/74, pulse 78, temperature 98.3 F (36.8 C), temperature source Oral, height 6\' 1"  (1.854 m), weight (!) 309 lb (140.2 kg), last menstrual period 01/19/2020, SpO2 98 %. Body mass index is 40.77 kg/m.  General: Cooperative, alert, well developed, in no acute distress. HEENT: Conjunctivae and lids unremarkable. Cardiovascular: Regular rhythm.  Lungs: Normal work of breathing. Neurologic: No focal deficits.   Lab Results  Component Value Date   CREATININE 0.66 11/10/2019   BUN 8 11/10/2019   NA 141 11/10/2019   K 4.3 11/10/2019   CL 103 11/10/2019   CO2 20 11/10/2019   Lab Results  Component Value Date   ALT 35 (H) 11/10/2019   AST 27 11/10/2019   ALKPHOS 92 11/10/2019   BILITOT 0.3 11/10/2019   Lab Results  Component Value Date   HGBA1C 5.6 11/10/2019   Lab Results  Component Value Date   INSULIN 29.3 (H) 11/10/2019   Lab Results  Component Value Date  TSH 6.290 (H) 11/10/2019   Lab Results  Component Value Date   CHOL 218 (H) 11/10/2019   HDL 47 11/10/2019   LDLCALC 125 (H) 11/10/2019   TRIG 263 (H) 11/10/2019   Lab Results  Component Value Date   WBC 12.8 (H) 11/10/2019   HGB 15.5 11/10/2019   HCT 47.8 (H) 11/10/2019   MCV 90 11/10/2019   PLT  300 11/10/2019   No results found for: IRON, TIBC, FERRITIN  Attestation Statements:   Reviewed by clinician on day of visit: allergies, medications, problem list, medical history, surgical history, family history, social history, and previous encounter notes.   I, Trixie Dredge, am acting as transcriptionist for Coralie Common, MD. I have reviewed the above documentation for accuracy and completeness, and I agree with the above. - Jinny Blossom, MD

## 2020-02-16 ENCOUNTER — Other Ambulatory Visit: Payer: Self-pay

## 2020-02-16 ENCOUNTER — Telehealth (INDEPENDENT_AMBULATORY_CARE_PROVIDER_SITE_OTHER): Payer: BC Managed Care – PPO | Admitting: Adult Health

## 2020-02-16 ENCOUNTER — Encounter (INDEPENDENT_AMBULATORY_CARE_PROVIDER_SITE_OTHER): Payer: Self-pay

## 2020-02-16 ENCOUNTER — Telehealth (INDEPENDENT_AMBULATORY_CARE_PROVIDER_SITE_OTHER): Payer: Self-pay

## 2020-02-16 VITALS — Ht 73.0 in | Wt 310.0 lb

## 2020-02-16 DIAGNOSIS — F418 Other specified anxiety disorders: Secondary | ICD-10-CM

## 2020-02-16 DIAGNOSIS — E559 Vitamin D deficiency, unspecified: Secondary | ICD-10-CM

## 2020-02-16 DIAGNOSIS — E8881 Metabolic syndrome: Secondary | ICD-10-CM | POA: Diagnosis not present

## 2020-02-16 DIAGNOSIS — Z6841 Body Mass Index (BMI) 40.0 and over, adult: Secondary | ICD-10-CM

## 2020-02-16 DIAGNOSIS — E88819 Insulin resistance, unspecified: Secondary | ICD-10-CM

## 2020-02-16 MED ORDER — SERTRALINE HCL 25 MG PO TABS: 50.0000 mg | ORAL_TABLET | Freq: Every day | ORAL | 0 refills | Status: DC

## 2020-02-16 MED ORDER — VITAMIN D (ERGOCALCIFEROL) 1.25 MG (50000 UNIT) PO CAPS: 50000.0000 [IU] | ORAL_CAPSULE | ORAL | 0 refills | Status: DC

## 2020-02-16 NOTE — Telephone Encounter (Signed)
Pt contacted. Verbal telehealth consent obtained to conduct visit via video call.   Zaineb Nowaczyk LPN

## 2020-02-17 NOTE — Progress Notes (Signed)
TeleHealth Visit:  Due to the COVID-19 pandemic, this visit was completed with telemedicine (audio/video) technology to reduce patient and provider exposure as well as to preserve personal protective equipment.   [name] has verbally consented to this TeleHealth visit. The patient is located at home, the provider is located at the [facility] office. The participants in this visit include the listed provider and patient. The visit was conducted today via MyChart video.  Chief Complaint: OBESITY Sherry Schmidt is here to discuss her progress with her obesity treatment plan along with follow-up of her obesity related diagnoses. Sherry Schmidt is on the Category 4 Plan.  Today's visit was #: 5 Starting weight: 312 lbs Starting date: 11/10/2019  Interim History: Sherry Schmidt woke up with a fever this morning, highest 101.6 F- oral.  She denies known exposure to COVID-19.  She is fully vaccinated with Pfizer COVID-19 vaccine, 2nd dose on 01/08/2020.  She denies others in the home with acute symptoms.  She reports increased fatigue.  If symptoms worsen or if she develops- loss of sense of smell or taste, severe shortness of breath, N/V/D- recommend being tested for COVID-19.  Follow-up with PCP.  Subjective:   1. Vitamin D deficiency Sherry Schmidt's Vitamin D level was 18.8 on 11/10/2019, which is below the goal of 50. She is currently taking prescription vitamin D 50,000 IU each week. She denies nausea, vomiting or muscle weakness.    2. Insulin resistance Sherry Schmidt has a diagnosis of insulin resistance based on her elevated fasting insulin level >5. She continues to work on diet and exercise to decrease her risk of diabetes.  On 11/10/2019, insulin level 29.3 with normal BG/A1c.  She has been focusing on lifestyle to normalize insulin.  Lab Results  Component Value Date   INSULIN 29.3 (H) 11/10/2019   Lab Results  Component Value Date   HGBA1C 5.6 11/10/2019   3. Depression with  anxiety She reports stable mood, denies SI/HI.  She is on sertraline 50 mg daily.  She is not established with a therapist.  She is agreeable to a referral to [facility].  Assessment/Plan:   1. Vitamin D deficiency Low Vitamin D level contributes to fatigue and are associated with obesity, breast, and colon cancer. She agrees to continue to take prescription Vitamin D @50 ,000 IU every week and will follow-up for routine testing of Vitamin D, at least 2-3 times per year to avoid over-replacement.  -Refill Vitamin D, Ergocalciferol, (DRISDOL) 1.25 MG (50000 UNIT) CAPS capsule; Take 1 capsule (50,000 Units total) by mouth every 7 (seven) days.  Dispense: 4 capsule; Refill: 0  2. Insulin resistance Sherry Schmidt will continue to work on weight loss, exercise, and decreasing simple carbohydrates to help decrease the risk of diabetes. Sherry Schmidt agreed to follow-up with [place] as directed to closely monitor her progress.  Continue Category 4 meal plan.  3. Depression with anxiety Referral to [facility].  Will refill sertraline, as per below.  -Refill sertraline (ZOLOFT) 25 MG tablet; Take 2 tablets (50 mg total) by mouth daily.  Dispense: 30 tablet; Refill: 0 - Ambulatory referral to Psychology  4. Class 3 severe obesity with serious comorbidity and body mass index (BMI) of 40.0 to 44.9 in adult, unspecified obesity type ([place])  Sherry Schmidt is currently in the action stage of change. As such, her goal is to continue with weight loss efforts. She has agreed to the Category 4 Plan.   Exercise goals: Cardio/weights 3 days per week for 60-120 minutes.  Behavioral  modification strategies: increasing lean protein intake, increasing water intake, meal planning and cooking strategies and planning for success.  Sherry Schmidt has agreed to follow-up with our clinic in 2 weeks, fasting. She was informed of the importance of frequent follow-up visits to maximize her success with intensive lifestyle  modifications for her multiple health conditions.  Objective:   VITALS: Per patient if applicable, see vitals. GENERAL: Alert and in no acute distress. CARDIOPULMONARY: No increased WOB. Speaking in clear sentences.  PSYCH: Pleasant and cooperative. Speech normal rate and rhythm. Affect is appropriate. Insight and judgement are appropriate. Attention is focused, linear, and appropriate.  NEURO: Oriented as arrived to appointment on time with no prompting.   Lab Results  Component Value Date   CREATININE 0.66 11/10/2019   BUN 8 11/10/2019   NA 141 11/10/2019   K 4.3 11/10/2019   CL 103 11/10/2019   CO2 20 11/10/2019   Lab Results  Component Value Date   ALT 35 (H) 11/10/2019   AST 27 11/10/2019   ALKPHOS 92 11/10/2019   BILITOT 0.3 11/10/2019   Lab Results  Component Value Date   HGBA1C 5.6 11/10/2019   Lab Results  Component Value Date   INSULIN 29.3 (H) 11/10/2019   Lab Results  Component Value Date   TSH 6.290 (H) 11/10/2019   Lab Results  Component Value Date   CHOL 218 (H) 11/10/2019   HDL 47 11/10/2019   LDLCALC 125 (H) 11/10/2019   TRIG 263 (H) 11/10/2019   Lab Results  Component Value Date   WBC 12.8 (H) 11/10/2019   HGB 15.5 11/10/2019   HCT 47.8 (H) 11/10/2019   MCV 90 11/10/2019   PLT 300 11/10/2019   Attestation Statements:   Reviewed by clinician on day of visit: allergies, medications, problem list, medical history, surgical history, family history, social history, and previous encounter notes.  Time spent on visit including pre-visit chart review and post-visit charting and care was 32 minutes.   I, Water quality scientist, CMA, am acting as Location manager for Mina Marble, NP.  I have reviewed the above documentation for accuracy and completeness, and I agree with the above. - Almus Woodham d. Oscar Forman, NP-C

## 2020-02-18 DIAGNOSIS — E8881 Metabolic syndrome: Secondary | ICD-10-CM | POA: Insufficient documentation

## 2020-02-18 DIAGNOSIS — Z6841 Body Mass Index (BMI) 40.0 and over, adult: Secondary | ICD-10-CM | POA: Insufficient documentation

## 2020-02-18 DIAGNOSIS — E559 Vitamin D deficiency, unspecified: Secondary | ICD-10-CM | POA: Insufficient documentation

## 2020-02-18 DIAGNOSIS — F418 Other specified anxiety disorders: Secondary | ICD-10-CM | POA: Insufficient documentation

## 2020-02-28 ENCOUNTER — Telehealth (INDEPENDENT_AMBULATORY_CARE_PROVIDER_SITE_OTHER): Payer: Self-pay

## 2020-02-28 ENCOUNTER — Other Ambulatory Visit (INDEPENDENT_AMBULATORY_CARE_PROVIDER_SITE_OTHER): Payer: Self-pay | Admitting: Family Medicine

## 2020-02-28 ENCOUNTER — Other Ambulatory Visit (INDEPENDENT_AMBULATORY_CARE_PROVIDER_SITE_OTHER): Payer: Self-pay | Admitting: Adult Health

## 2020-02-28 DIAGNOSIS — F418 Other specified anxiety disorders: Secondary | ICD-10-CM

## 2020-02-28 DIAGNOSIS — E559 Vitamin D deficiency, unspecified: Secondary | ICD-10-CM

## 2020-02-28 NOTE — Telephone Encounter (Signed)
This patient was last seen by Mina Marble, FNP, and currently has an upcoming appt scheduled on 03/06/20 with her.

## 2020-02-28 NOTE — Telephone Encounter (Signed)
Pt called to get her refill on her ZOLOFT, please give pt a call.

## 2020-02-28 NOTE — Telephone Encounter (Signed)
Last OV with Sherry Schmidt 

## 2020-02-29 ENCOUNTER — Telehealth (INDEPENDENT_AMBULATORY_CARE_PROVIDER_SITE_OTHER): Payer: Self-pay

## 2020-02-29 NOTE — Telephone Encounter (Signed)
Zoloft prescription classified as no print, pharmacy did not receive prescription. Please give [place] on [phone] regarding this refill.

## 2020-02-29 NOTE — Telephone Encounter (Signed)
Last seen Katy 

## 2020-03-01 ENCOUNTER — Other Ambulatory Visit (INDEPENDENT_AMBULATORY_CARE_PROVIDER_SITE_OTHER): Payer: Self-pay | Admitting: Adult Health

## 2020-03-01 ENCOUNTER — Encounter (INDEPENDENT_AMBULATORY_CARE_PROVIDER_SITE_OTHER): Payer: Self-pay

## 2020-03-01 DIAGNOSIS — F418 Other specified anxiety disorders: Secondary | ICD-10-CM

## 2020-03-01 MED ORDER — SERTRALINE HCL 50 MG PO TABS: ORAL_TABLET | ORAL | 0 refills | Status: DC

## 2020-03-01 NOTE — Telephone Encounter (Signed)
Rx refill request. Pt has f/u scheduled 03-06-20. Last visit was video.

## 2020-03-06 ENCOUNTER — Other Ambulatory Visit: Payer: Self-pay

## 2020-03-06 ENCOUNTER — Encounter (INDEPENDENT_AMBULATORY_CARE_PROVIDER_SITE_OTHER): Payer: Self-pay | Admitting: Adult Health

## 2020-03-06 ENCOUNTER — Ambulatory Visit (INDEPENDENT_AMBULATORY_CARE_PROVIDER_SITE_OTHER): Payer: BC Managed Care – PPO | Admitting: Adult Health

## 2020-03-06 VITALS — BP 118/76 | HR 73 | Temp 98.0°F | Ht 73.0 in | Wt 316.0 lb

## 2020-03-06 DIAGNOSIS — E8881 Metabolic syndrome: Secondary | ICD-10-CM | POA: Diagnosis not present

## 2020-03-06 DIAGNOSIS — R7401 Elevation of levels of liver transaminase levels: Secondary | ICD-10-CM | POA: Insufficient documentation

## 2020-03-06 DIAGNOSIS — R7989 Other specified abnormal findings of blood chemistry: Secondary | ICD-10-CM

## 2020-03-06 DIAGNOSIS — E039 Hypothyroidism, unspecified: Secondary | ICD-10-CM | POA: Diagnosis not present

## 2020-03-06 DIAGNOSIS — E559 Vitamin D deficiency, unspecified: Secondary | ICD-10-CM | POA: Diagnosis not present

## 2020-03-06 DIAGNOSIS — E782 Mixed hyperlipidemia: Secondary | ICD-10-CM | POA: Insufficient documentation

## 2020-03-06 DIAGNOSIS — Z9189 Other specified personal risk factors, not elsewhere classified: Secondary | ICD-10-CM | POA: Diagnosis not present

## 2020-03-06 DIAGNOSIS — Z6841 Body Mass Index (BMI) 40.0 and over, adult: Secondary | ICD-10-CM

## 2020-03-06 NOTE — Progress Notes (Signed)
Chief Complaint:   OBESITY Sherry Schmidt is here to discuss her progress with her obesity treatment plan along with follow-up of her obesity related diagnoses. Sherry Schmidt is on the Category 4 Plan and states she is following her eating plan approximately 100% of the time. Sherry Schmidt states she is doing cardio/strengthening 60-120 minutes 2-3 times per week.  Today's visit Schmidt #: 6 Starting weight: 312 lbs Starting date: 11/10/2019 Today's weight: 316 lbs Today's date: 03/06/2020 Total lbs lost to date: 0 Total lbs lost since last in-office visit: 0  Interim History: Sherry Schmidt will snack on fruit or "pretzel sticks", and chips - all less than 300 calories a day. She usually has a sandwich at lunch and a microwave meal for dinner.  Subjective:   Mixed hyperlipidemia. Sherry Schmidt has hyperlipidemia and has been trying to improve her cholesterol levels with intensive lifestyle modification including a low saturated fat diet, exercise and weight loss. She denies any chest pain, claudication or myalgias. Lipid panel on 11/10/2019 showed total cholesterol, triglycerides, and LDL all to be above goal. Sherry Schmidt is not on statin therapy.  Lab Results  Component Value Date   ALT 35 (H) 11/10/2019   AST 27 11/10/2019   ALKPHOS 92 11/10/2019   BILITOT 0.3 11/10/2019   Lab Results  Component Value Date   CHOL 218 (H) 11/10/2019   HDL 47 11/10/2019   LDLCALC 125 (H) 11/10/2019   TRIG 263 (H) 11/10/2019   Vitamin D deficiency. Vitamin D on 11/10/2019 Schmidt 18.8, well below goal of 50. Sherry Schmidt is on Ergocalciferol. No nausea, vomiting, or muscle weakness.    Ref. Range 11/10/2019 14:26  Vitamin D, 25-Hydroxy Latest Ref Range: 30.0 - 100.0 ng/mL 18.8 (L)   Insulin resistance. Sherry Schmidt has a diagnosis of insulin resistance based on her elevated fasting insulin level >5. She continues to work on diet and exercise to decrease her risk of diabetes. Insulin level Schmidt elevated on 11/10/2019 to  29.3 with normal blood glucose and A1c. Sherry Schmidt is not on metformin or a GLP-1.  Lab Results  Component Value Date   INSULIN 29.3 (H) 11/10/2019   Lab Results  Component Value Date   HGBA1C 5.6 11/10/2019   Abnormal thyroid blood test. TSH Schmidt elevated on 11/10/2019 to 6.290 with normal T3 and T4. Sherry Schmidt is on levothyroxine 100 mcg daily.  Transaminitis. 11/10/2019 ALT 35 with normal AST and alkaline phosphatase.    At risk for heart disease. Sherry Schmidt is at a higher than average risk for cardiovascular disease due to hyperlipidemia and obesity.   Assessment/Plan:   Mixed hyperlipidemia. Cardiovascular risk and specific lipid/LDL goals reviewed.  We discussed several lifestyle modifications today and Sherry Schmidt will continue to work on diet, exercise and weight loss efforts. Orders and follow up as documented in patient record. Labs will be checked today.   Counseling Intensive lifestyle modifications are the first line treatment for this issue. . Dietary changes: Increase soluble fiber. Decrease simple carbohydrates. . Exercise changes: Moderate to vigorous-intensity aerobic activity 150 minutes per week if tolerated.  . Lipid-lowering medications: see documented in medical record.  Vitamin D deficiency. Low Vitamin D level contributes to fatigue and are associated with obesity, breast, and colon cancer. VITAMIN D 25 Hydroxy (Vit-D Deficiency, Fractures) level will be checked today.   Insulin resistance. Sherry Schmidt will continue to work on weight loss, exercise, and decreasing simple carbohydrates to help decrease the risk of diabetes. Sherry Schmidt to follow-up with [place] as directed to closely monitor her progress.  Labs will be checked today.   Abnormal thyroid blood test. Labs will be checked today.   Transaminitis. Labs will be checked today.   At risk for heart disease. Sherry Schmidt Schmidt given approximately 15 minutes of coronary artery disease prevention counseling today. She  is 24 y.o. female and has risk factors for heart disease including obesity. We discussed intensive lifestyle modifications today with an emphasis on specific weight loss instructions and strategies.   Repetitive spaced learning Schmidt employed today to elicit superior memory formation and behavioral change.  Class 3 severe obesity with serious comorbidity and body mass index (BMI) of 40.0 to 44.9 in adult, unspecified obesity type ([place]).   Sherry Schmidt is currently in the action stage of change. As such, her goal is to continue with weight loss efforts. She has Schmidt to the Category 4 Plan.   Handouts were provided on Protein Equivalent Snacks and Microwave Meal List.  Exercise goals: Sherry Schmidt will continue cardio/strengthening 60-120 minutes 2-3 times per week.  Behavioral modification strategies: increasing lean protein intake, increasing water intake, meal planning and cooking strategies and planning for success.  Sherry Schmidt has Schmidt to follow-up with our clinic in 2 weeks. She Schmidt informed of the importance of frequent follow-up visits to maximize her success with intensive lifestyle modifications for her multiple health conditions.   Sherry Schmidt Schmidt informed we would discuss her lab results at her next visit unless there is a critical issue that needs to be addressed sooner. Sherry Schmidt Schmidt to keep her next visit at the Schmidt upon time to discuss these results.  Objective:   Blood pressure 118/76, pulse 73, temperature 98 F (36.7 C), height 6\' 1"  (1.854 m), weight (!) 316 lb (143.3 kg), SpO2 96 %. Body mass index is 41.69 kg/m.  General: Cooperative, alert, well developed, in no acute distress. HEENT: Conjunctivae and lids unremarkable. Cardiovascular: Regular rhythm.  Lungs: Normal work of breathing. Neurologic: No focal deficits.   Lab Results  Component Value Date   CREATININE 0.66 11/10/2019   BUN 8 11/10/2019   NA 141 11/10/2019   K 4.3 11/10/2019   CL 103 11/10/2019    CO2 20 11/10/2019   Lab Results  Component Value Date   ALT 35 (H) 11/10/2019   AST 27 11/10/2019   ALKPHOS 92 11/10/2019   BILITOT 0.3 11/10/2019   Lab Results  Component Value Date   HGBA1C 5.6 11/10/2019   Lab Results  Component Value Date   INSULIN 29.3 (H) 11/10/2019   Lab Results  Component Value Date   TSH 6.290 (H) 11/10/2019   Lab Results  Component Value Date   CHOL 218 (H) 11/10/2019   HDL 47 11/10/2019   LDLCALC 125 (H) 11/10/2019   TRIG 263 (H) 11/10/2019   Lab Results  Component Value Date   WBC 12.8 (H) 11/10/2019   HGB 15.5 11/10/2019   HCT 47.8 (H) 11/10/2019   MCV 90 11/10/2019   PLT 300 11/10/2019   No results found for: IRON, TIBC, FERRITIN  Attestation Statements:   Reviewed by clinician on day of visit: allergies, medications, problem list, medical history, surgical history, family history, social history, and previous encounter notes.  I, Michaelene Song, am acting as Location manager for [employer], NP-C   I have reviewed the above documentation for accuracy and completeness, and I agree with the above. -  Pernell Lenoir d. Herron Fero, NP-C

## 2020-03-07 LAB — COMPREHENSIVE METABOLIC PANEL
ALT: 26 IU/L (ref 0–32)
AST: 20 IU/L (ref 0–40)
Albumin/Globulin Ratio: 1.5 (ref 1.2–2.2)
Albumin: 4.5 g/dL (ref 3.9–5.0)
Alkaline Phosphatase: 110 IU/L (ref 44–121)
BUN/Creatinine Ratio: 19 (ref 9–23)
BUN: 13 mg/dL (ref 6–20)
Bilirubin Total: <0.2 mg/dL (ref 0.0–1.2)
CO2: 20 mmol/L (ref 20–29)
Calcium: 9.6 mg/dL (ref 8.7–10.2)
Chloride: 104 mmol/L (ref 96–106)
Creatinine, Ser: 0.69 mg/dL (ref 0.57–1.00)
GFR calc Af Amer: 141 mL/min/{1.73_m2} (ref >59)
GFR calc non Af Amer: 122 mL/min/{1.73_m2} (ref >59)
Globulin, Total: 3 g/dL (ref 1.5–4.5)
Glucose: 81 mg/dL (ref 65–99)
Potassium: 4.1 mmol/L (ref 3.5–5.2)
Sodium: 140 mmol/L (ref 134–144)
Total Protein: 7.5 g/dL (ref 6.0–8.5)

## 2020-03-07 LAB — INSULIN, RANDOM: INSULIN: 80.9 u[IU]/mL — ABNORMAL HIGH (ref 2.6–24.9)

## 2020-03-07 LAB — T4: T4, Total: 5.9 ug/dL (ref 4.5–12.0)

## 2020-03-07 LAB — HEMOGLOBIN A1C
Est. average glucose Bld gHb Est-mCnc: 108 mg/dL
Hgb A1c MFr Bld: 5.4 % (ref 4.8–5.6)

## 2020-03-07 LAB — VITAMIN D 25 HYDROXY (VIT D DEFICIENCY, FRACTURES): Vit D, 25-Hydroxy: 17.5 ng/mL — ABNORMAL LOW (ref 30.0–100.0)

## 2020-03-07 LAB — TSH: TSH: 4.07 u[IU]/mL (ref 0.450–4.500)

## 2020-03-07 LAB — T3: T3, Total: 88 ng/dL (ref 71–180)

## 2020-03-20 ENCOUNTER — Ambulatory Visit (INDEPENDENT_AMBULATORY_CARE_PROVIDER_SITE_OTHER): Payer: BC Managed Care – PPO | Admitting: Adult Health

## 2020-03-20 ENCOUNTER — Encounter (INDEPENDENT_AMBULATORY_CARE_PROVIDER_SITE_OTHER): Payer: Self-pay

## 2020-03-30 ENCOUNTER — Other Ambulatory Visit (INDEPENDENT_AMBULATORY_CARE_PROVIDER_SITE_OTHER): Payer: Self-pay | Admitting: Adult Health

## 2020-03-30 ENCOUNTER — Other Ambulatory Visit (INDEPENDENT_AMBULATORY_CARE_PROVIDER_SITE_OTHER): Payer: Self-pay

## 2020-03-30 DIAGNOSIS — E559 Vitamin D deficiency, unspecified: Secondary | ICD-10-CM

## 2020-03-30 DIAGNOSIS — F418 Other specified anxiety disorders: Secondary | ICD-10-CM

## 2020-03-30 MED ORDER — SERTRALINE HCL 50 MG PO TABS: ORAL_TABLET | ORAL | 0 refills | Status: DC

## 2020-03-30 NOTE — Telephone Encounter (Signed)
Call to [facility].  She approved a one month supply of Zoloft but denied the Vitamin D.  She will refill the vitamin d at the next appointment.

## 2020-04-04 ENCOUNTER — Encounter (INDEPENDENT_AMBULATORY_CARE_PROVIDER_SITE_OTHER): Payer: Self-pay | Admitting: Adult Health

## 2020-04-04 ENCOUNTER — Telehealth (INDEPENDENT_AMBULATORY_CARE_PROVIDER_SITE_OTHER): Payer: BC Managed Care – PPO | Admitting: Adult Health

## 2020-04-04 DIAGNOSIS — F418 Other specified anxiety disorders: Secondary | ICD-10-CM

## 2020-04-04 DIAGNOSIS — E038 Other specified hypothyroidism: Secondary | ICD-10-CM | POA: Diagnosis not present

## 2020-04-04 DIAGNOSIS — E8881 Metabolic syndrome: Secondary | ICD-10-CM

## 2020-04-04 DIAGNOSIS — E559 Vitamin D deficiency, unspecified: Secondary | ICD-10-CM | POA: Diagnosis not present

## 2020-04-04 DIAGNOSIS — Z6841 Body Mass Index (BMI) 40.0 and over, adult: Secondary | ICD-10-CM

## 2020-04-04 MED ORDER — VITAMIN D (ERGOCALCIFEROL) 1.25 MG (50000 UNIT) PO CAPS: 50000.0000 [IU] | ORAL_CAPSULE | ORAL | 0 refills | Status: DC

## 2020-04-04 MED ORDER — LEVOTHYROXINE SODIUM 100 MCG PO TABS: 100.0000 ug | ORAL_TABLET | Freq: Every day | ORAL | 0 refills | Status: DC

## 2020-04-05 NOTE — Progress Notes (Signed)
TeleHealth Visit:  Due to the COVID-19 pandemic, this visit was completed with telemedicine (audio/video) technology to reduce patient and provider exposure as well as to preserve personal protective equipment.   [name] has verbally consented to this TeleHealth visit. The patient is located at home, the provider is located at the [facility] office. The participants in this visit include the listed provider and patient. The visit was conducted today via video.   Chief Complaint: OBESITY Sherry Schmidt is here to discuss her progress with her obesity treatment plan along with follow-up of her obesity related diagnoses. Sherry Schmidt is on the Category 4 Plan and states she is following her eating plan approximately 100% of the time. Sherry Schmidt states she is exercising 0 minutes 0 times per week.  Today's visit was #: 7 Starting weight: 312 lbs Starting date: 11/10/2019  Interim History: Sherry Schmidt reports that she developed fever, chills, fatigue, cough, diarrhea on 03/31/2020. SARS-COV-2 results positive on 04/01/2020. She is fully vaccinated with Pfizer COVID-19 vaccine. She received her second dose November 2021. Sherry Schmidt reports that this is her 6th infection with COVID-19! She reports poor appetite while acutely ill.  Subjective:   1. Vitamin D deficiency Sherry Schmidt's Vitamin D level was 17.5 on 03/06/2020, which is well below goal of 50. She is currently taking prescription vitamin D 50,000 IU each week. She denies nausea, vomiting or muscle weakness. Discussed labs with patient today.    Ref. Range 03/06/2020 12:10  Vitamin D, 25-Hydroxy Latest Ref Range: 30.0 - 100.0 ng/mL 17.5 (L)    2. Insulin resistance Sherry Schmidt has a diagnosis of insulin resistance based on her elevated fasting insulin level >5. She continues to work on diet and exercise to decrease her risk of diabetes. 03/06/2020 normal blood sugar and A1c with worsening insulin resistance. Discussed labs with patient  today.  Lab Results  Component Value Date   INSULIN 80.9 (H) 03/06/2020   INSULIN 29.3 (H) 11/10/2019   Lab Results  Component Value Date   HGBA1C 5.4 03/06/2020    3. Depression with anxiety Sherry Schmidt reports good symptom control with sertraline 50 mg daily (increased from 25 mg daily at the end of Oct 2021). However, she reports emotional bluntness. She denies suicidal or homicidal ideations.  She reports excellent support system.   4. Other specified hypothyroidism Discussed labs with patient today. 03/06/2020 thyroid panel is within normal limits. Sherry Schmidt's previous TSH on 11/10/2019 was elevated at 6.290. She had been on levothyroxine for months at the time of the 11/10/2019 blood draw. She is now back on levothyroxine 100 mg a day consistently for 2 months- denies fatigue or palpitations. She is managed by her PCP, however she is searching for a new PCP and would like a bridge prescription until establishing with a new provider.  Lab Results  Component Value Date   TSH 4.070 03/06/2020    Assessment/Plan:   1. Vitamin D deficiency Low Vitamin D level contributes to fatigue and are associated with obesity, breast, and colon cancer. She agrees to continue to take prescription Vitamin D @50 ,000 IU every week and will follow-up for routine testing of Vitamin D, at least 2-3 times per year to avoid over-replacement.  - Vitamin D, Ergocalciferol, (DRISDOL) 1.25 MG (50000 UNIT) CAPS capsule; Take 1 capsule (50,000 Units total) by mouth every 7 (seven) days.  Dispense: 4 capsule; Refill: 0  2. Insulin resistance Sherry Schmidt will continue to work on weight loss, exercise, and decreasing simple carbohydrates to help decrease the risk of  diabetes. Sherry Schmidt agreed to follow-up with [place] as directed to closely monitor her progress. Increase protein and limit simple carbohydrate intake.  3. Depression with anxiety Continue current treatment plan of sertraline 50 mg daily and monitor mood.  Will  decrease if emotional bluntness impacts functioning.  4. Other specified hypothyroidism Patient with long-standing hypothyroidism, on levothyroxine therapy. She appears euthyroid. Orders and follow up as documented in patient record. We will refill levothyroxine 100 mg daily for 3 month supply. One time refill until she establishes with new PCP.  Counseling . Good thyroid control is important for overall health. Supratherapeutic thyroid levels are dangerous and will not improve weight loss results. . The correct way to take levothyroxine is fasting, with water, separated by at least 30 minutes from breakfast, and separated by more than 4 hours from calcium, iron, multivitamins, acid reflux medications (PPIs).    5. Class 3 severe obesity with serious comorbidity and body mass index (BMI) of 40.0 to 44.9 in adult, unspecified obesity type ([place]) Sherry Schmidt is currently in the action stage of change. As such, her goal is to continue with weight loss efforts. She has agreed to the Category 4 Plan.   Exercise goals: No exercise has been prescribed at this time.  Behavioral modification strategies: increasing lean protein intake, no skipping meals, meal planning and cooking strategies and planning for success.  Sherry Schmidt has agreed to follow-up with our clinic in 2 weeks. She was informed of the importance of frequent follow-up visits to maximize her success with intensive lifestyle modifications for her multiple health conditions.   Objective:   VITALS: Per patient if applicable, see vitals. GENERAL: Alert and in no acute distress. CARDIOPULMONARY: No increased WOB. Speaking in clear sentences.  PSYCH: Pleasant and cooperative. Speech normal rate and rhythm. Affect is appropriate. Insight and judgement are appropriate. Attention is focused, linear, and appropriate.  NEURO: Oriented as arrived to appointment on time with no prompting.   Lab Results  Component Value Date   CREATININE 0.69  03/06/2020   BUN 13 03/06/2020   NA 140 03/06/2020   K 4.1 03/06/2020   CL 104 03/06/2020   CO2 20 03/06/2020   Lab Results  Component Value Date   ALT 26 03/06/2020   AST 20 03/06/2020   ALKPHOS 110 03/06/2020   BILITOT <0.2 03/06/2020   Lab Results  Component Value Date   HGBA1C 5.4 03/06/2020   HGBA1C 5.6 11/10/2019   Lab Results  Component Value Date   INSULIN 80.9 (H) 03/06/2020   INSULIN 29.3 (H) 11/10/2019   Lab Results  Component Value Date   TSH 4.070 03/06/2020   Lab Results  Component Value Date   CHOL 218 (H) 11/10/2019   HDL 47 11/10/2019   LDLCALC 125 (H) 11/10/2019   TRIG 263 (H) 11/10/2019   Lab Results  Component Value Date   WBC 12.8 (H) 11/10/2019   HGB 15.5 11/10/2019   HCT 47.8 (H) 11/10/2019   MCV 90 11/10/2019   PLT 300 11/10/2019   No results found for: IRON, TIBC, FERRITIN  Attestation Statements:   Reviewed by clinician on day of visit: allergies, medications, problem list, medical history, surgical history, family history, social history, and previous encounter notes.  Time spent on visit including pre-visit chart review and post-visit charting and care was 32 minutes.   Coral Ceo, am acting as Location manager for Mina Marble, NP.  I have reviewed the above documentation for accuracy and completeness, and I agree with the above. -  Deaundre Allston d. Merilee Wible, NP-C

## 2020-04-06 ENCOUNTER — Ambulatory Visit (INDEPENDENT_AMBULATORY_CARE_PROVIDER_SITE_OTHER): Payer: BC Managed Care – PPO | Admitting: Psychology

## 2020-04-06 DIAGNOSIS — F331 Major depressive disorder, recurrent, moderate: Secondary | ICD-10-CM

## 2020-04-20 ENCOUNTER — Ambulatory Visit: Payer: BC Managed Care – PPO | Admitting: Psychology

## 2020-04-26 ENCOUNTER — Encounter (INDEPENDENT_AMBULATORY_CARE_PROVIDER_SITE_OTHER): Payer: Self-pay | Admitting: Adult Health

## 2020-04-27 ENCOUNTER — Encounter (INDEPENDENT_AMBULATORY_CARE_PROVIDER_SITE_OTHER): Payer: Self-pay | Admitting: Adult Health

## 2020-04-27 ENCOUNTER — Ambulatory Visit (INDEPENDENT_AMBULATORY_CARE_PROVIDER_SITE_OTHER): Payer: BC Managed Care – PPO | Admitting: Adult Health

## 2020-04-27 ENCOUNTER — Other Ambulatory Visit: Payer: Self-pay

## 2020-04-27 VITALS — BP 115/78 | HR 65 | Temp 98.1°F | Ht 73.0 in | Wt 313.0 lb

## 2020-04-27 DIAGNOSIS — Z9189 Other specified personal risk factors, not elsewhere classified: Secondary | ICD-10-CM | POA: Diagnosis not present

## 2020-04-27 DIAGNOSIS — Z6841 Body Mass Index (BMI) 40.0 and over, adult: Secondary | ICD-10-CM

## 2020-04-27 DIAGNOSIS — E559 Vitamin D deficiency, unspecified: Secondary | ICD-10-CM

## 2020-04-27 DIAGNOSIS — F418 Other specified anxiety disorders: Secondary | ICD-10-CM | POA: Diagnosis not present

## 2020-04-27 DIAGNOSIS — E8881 Metabolic syndrome: Secondary | ICD-10-CM | POA: Diagnosis not present

## 2020-04-27 MED ORDER — VITAMIN D (ERGOCALCIFEROL) 1.25 MG (50000 UNIT) PO CAPS: 50000.0000 [IU] | ORAL_CAPSULE | ORAL | 0 refills | Status: DC

## 2020-04-27 MED ORDER — SERTRALINE HCL 50 MG PO TABS: ORAL_TABLET | ORAL | 0 refills | Status: DC

## 2020-05-01 NOTE — Progress Notes (Signed)
Chief Complaint:   OBESITY Sherry Schmidt is here to discuss her progress with her obesity treatment plan along with follow-up of her obesity related diagnoses. Sherry Schmidt is on the Category 4 Plan and states she is following her eating plan approximately 80% of the time. Sherry Schmidt states she is cardio and strength training 60 minutes 5 times per week.  Today's visit was #: 8 Starting weight: 312 lbs Starting date: 11/10/2019 Today's weight: 313 lbs Today's date: 04/27/2020 Total lbs lost to date: 0 Total lbs lost since last in-office visit: 3 lbs  Interim History: Pt continues to enjoy breakfast and lunch on category 4 meal plan and beginning to bore with dinner protein oz and vegetables servings. She has really enjoying her workout at [employer], 60 minutes of combination of cardio and strength training- 5 days per week.  Subjective:   1. Vitamin D deficiency Sherry Schmidt's Vitamin D level was 17.5 on 03/06/2020. She is currently taking prescription vitamin D 50,000 IU each week. She denies nausea, vomiting or muscle weakness. Her Vit D remains sub-therapeutic on once a week Vit D since 10/2019.    Ref. Range 03/06/2020 12:10  Vitamin D, 25-Hydroxy Latest Ref Range: 30.0 - 100.0 ng/mL 17.5 (L)   2. Insulin resistance 03/06/2020 Insulin level is steadily worsening at 80.9, which is an increase drom 29.3 on 11/10/2019. However, her BG and A1c remain at goal.   Lab Results  Component Value Date   INSULIN 80.9 (H) 03/06/2020   INSULIN 29.3 (H) 11/10/2019   Lab Results  Component Value Date   HGBA1C 5.4 03/06/2020    3. Depression with anxiety Pt reports stable mood. She denies suicidal and homicidal ideations. She is on sertraline 50 mg daily. She denies symptoms of serotonin syndrome.  4. At risk for osteoporosis Sherry Schmidt is at higher risk of osteopenia and osteoporosis due to Vitamin D deficiency and obesity.   Assessment/Plan:   1. Vitamin D deficiency Low Vitamin D level  contributes to fatigue and are associated with obesity, breast, and colon cancer. She agrees to increase prescription Vitamin D @50 ,000 IU to every 3 days and will follow-up for routine testing of Vitamin D, at least 2-3 times per year to avoid over-replacement.  - Vitamin D, Ergocalciferol, (DRISDOL) 1.25 MG (50000 UNIT) CAPS capsule; Take 1 capsule (50,000 Units total) by mouth every 3 (three) days.  Dispense: 8 capsule; Refill: 0  2. Insulin resistance Lakrista will continue to work on weight loss, exercise, and decreasing simple carbohydrates to help decrease the risk of diabetes. Jeanenne agreed to follow-up with [place] as directed to closely monitor her progress. Continue regular exercise and limit simple carbohydrates.  3. Depression with anxiety Continue current treatment plan. Refill sertraline 50 mg daily.  - sertraline (ZOLOFT) 50 MG tablet; Take one tablet by mouth daily  Dispense: 30 tablet; Refill: 0  4. At risk for osteoporosis Sherry Schmidt was given approximately 15 minutes of osteoporosis prevention counseling today. Sherry Schmidt is at risk for osteopenia and osteoporosis due to her Vitamin D deficiency. She was encouraged to take her Vitamin D and follow her higher calcium diet and increase strengthening exercise to help strengthen her bones and decrease her risk of osteopenia and osteoporosis.  Repetitive spaced learning was employed today to elicit superior memory formation and behavioral change.  5. Class 3 severe obesity with serious comorbidity and body mass index (BMI) of 40.0 to 44.9 in adult, unspecified obesity type ([place]) Sherry Schmidt is currently in the action stage of change.  As such, her goal is to continue with weight loss efforts. She has agreed to the Category 4 Plan and keeping a food journal and adhering to recommended goals of 550-700 calories and 45 g protein for supper.   Handout: Recipe Guide, Eating Out Guide  Exercise goals: As is at [employer].  Behavioral  modification strategies: increasing lean protein intake, meal planning and cooking strategies, planning for success and keeping a strict food journal.  Sherry Schmidt has agreed to follow-up with our clinic in 2 weeks. She was informed of the importance of frequent follow-up visits to maximize her success with intensive lifestyle modifications for her multiple health conditions.   Objective:   Blood pressure 115/78, pulse 65, temperature 98.1 F (36.7 C), temperature source Oral, height 6\' 1"  (1.854 m), weight (!) 313 lb (142 kg), last menstrual period 03/13/2020, SpO2 97 %. Body mass index is 41.3 kg/m.  General: Cooperative, alert, well developed, in no acute distress. HEENT: Conjunctivae and lids unremarkable. Cardiovascular: Regular rhythm.  Lungs: Normal work of breathing. Neurologic: No focal deficits.   Lab Results  Component Value Date   CREATININE 0.69 03/06/2020   BUN 13 03/06/2020   NA 140 03/06/2020   K 4.1 03/06/2020   CL 104 03/06/2020   CO2 20 03/06/2020   Lab Results  Component Value Date   ALT 26 03/06/2020   AST 20 03/06/2020   ALKPHOS 110 03/06/2020   BILITOT <0.2 03/06/2020   Lab Results  Component Value Date   HGBA1C 5.4 03/06/2020   HGBA1C 5.6 11/10/2019   Lab Results  Component Value Date   INSULIN 80.9 (H) 03/06/2020   INSULIN 29.3 (H) 11/10/2019   Lab Results  Component Value Date   TSH 4.070 03/06/2020   Lab Results  Component Value Date   CHOL 218 (H) 11/10/2019   HDL 47 11/10/2019   LDLCALC 125 (H) 11/10/2019   TRIG 263 (H) 11/10/2019   Lab Results  Component Value Date   WBC 12.8 (H) 11/10/2019   HGB 15.5 11/10/2019   HCT 47.8 (H) 11/10/2019   MCV 90 11/10/2019   PLT 300 11/10/2019     Attestation Statements:   Reviewed by clinician on day of visit: allergies, medications, problem list, medical history, surgical history, family history, social history, and previous encounter notes.  Coral Ceo, am acting as  Location manager for Mina Marble, NP.  I have reviewed the above documentation for accuracy and completeness, and I agree with the above. -  Shetara Launer d. Anthonymichael Munday, NP-C

## 2020-05-11 ENCOUNTER — Ambulatory Visit (INDEPENDENT_AMBULATORY_CARE_PROVIDER_SITE_OTHER): Payer: BC Managed Care – PPO | Admitting: Adult Health

## 2020-05-11 ENCOUNTER — Encounter (INDEPENDENT_AMBULATORY_CARE_PROVIDER_SITE_OTHER): Payer: Self-pay

## 2020-05-12 ENCOUNTER — Encounter (INDEPENDENT_AMBULATORY_CARE_PROVIDER_SITE_OTHER): Payer: Self-pay

## 2020-05-12 DIAGNOSIS — E039 Hypothyroidism, unspecified: Secondary | ICD-10-CM | POA: Insufficient documentation

## 2020-05-30 ENCOUNTER — Encounter (INDEPENDENT_AMBULATORY_CARE_PROVIDER_SITE_OTHER): Payer: Self-pay | Admitting: Adult Health

## 2020-05-30 ENCOUNTER — Encounter (INDEPENDENT_AMBULATORY_CARE_PROVIDER_SITE_OTHER): Payer: Self-pay

## 2020-05-30 ENCOUNTER — Other Ambulatory Visit: Payer: Self-pay

## 2020-05-30 ENCOUNTER — Telehealth (INDEPENDENT_AMBULATORY_CARE_PROVIDER_SITE_OTHER): Payer: BC Managed Care – PPO | Admitting: Adult Health

## 2020-05-30 DIAGNOSIS — E559 Vitamin D deficiency, unspecified: Secondary | ICD-10-CM | POA: Diagnosis not present

## 2020-05-30 DIAGNOSIS — Z6841 Body Mass Index (BMI) 40.0 and over, adult: Secondary | ICD-10-CM | POA: Diagnosis not present

## 2020-05-30 DIAGNOSIS — F418 Other specified anxiety disorders: Secondary | ICD-10-CM | POA: Diagnosis not present

## 2020-05-30 MED ORDER — SERTRALINE HCL 50 MG PO TABS: ORAL_TABLET | ORAL | 0 refills | Status: DC

## 2020-05-30 MED ORDER — VITAMIN D (ERGOCALCIFEROL) 1.25 MG (50000 UNIT) PO CAPS: 50000.0000 [IU] | ORAL_CAPSULE | ORAL | 0 refills | Status: DC

## 2020-05-30 MED ORDER — SERTRALINE HCL 25 MG PO TABS: 25.0000 mg | ORAL_TABLET | Freq: Every day | ORAL | 0 refills | Status: DC

## 2020-05-31 NOTE — Progress Notes (Signed)
TeleHealth Visit:  Due to the COVID-19 pandemic, this visit was completed with telemedicine (audio/video) technology to reduce patient and provider exposure as well as to preserve personal protective equipment.   Sherry Schmidt has verbally consented to this TeleHealth visit. The patient is located at home, the provider is located at the [facility] office. The participants in this visit include the listed provider and patient. The visit was conducted today via video.  Chief Complaint: OBESITY Sherry Schmidt is here to discuss her progress with her obesity treatment plan along with follow-up of her obesity related diagnoses. Sherry Schmidt is on the Category 4 Plan and keeping a food journal and adhering to recommended goals of 550-700 calories and 45 g protein and states she is following her eating plan approximately 85% of the time. Sherry Schmidt states she is going to the gym 60-90 minutes 5 times per week.  Today's visit was #: 9 Starting weight: 312 lbs Starting date: 11/10/2019  Interim History: Sherry Schmidt awoke with a fever this morning- highest 100.0 F oral. She denies any other symptoms. She denies known exposure to COVID-19. She is fully vaccinated with Pfizer COVID-19 vaccine. Her last dose was 03/2020. She feels that she has maintained her weight since her last OV.  Subjective:   1. Vitamin D deficiency Sherry Schmidt's Vitamin D level was 17.5 on 03/06/2020. She is currently taking prescription vitamin D 50,000 IU each week. She denies nausea, vomiting or muscle weakness.   Ref. Range 03/06/2020 12:10  Vitamin D, 25-Hydroxy Latest Ref Range: 30.0 - 100.0 ng/mL 17.5 (L)   2. Depression with anxiety Sherry Schmidt is on Zoloft 50 mg daily. It was increased from 25 mg daily on 12/14/2019. She established with a therapist and has visits every 2.5 weeks via telemedicine. She feels that her depression symptoms are worsening. She is experiencing sadness, feeling more irritated. She vehemently denies  suicidal or homicidal ideations.  Assessment/Plan:   1. Vitamin D deficiency Low Vitamin D level contributes to fatigue and are associated with obesity, breast, and colon cancer. She agrees to continue to take prescription Vitamin D @50 ,000 IU every week and will follow-up for routine testing of Vitamin D, at least 2-3 times per year to avoid over-replacement.  - Vitamin D, Ergocalciferol, (DRISDOL) 1.25 MG (50000 UNIT) CAPS capsule; Take 1 capsule (50,000 Units total) by mouth every 3 (three) days.  Dispense: 8 capsule; Refill: 0  2. Depression with anxiety Behavior modification techniques were discussed today to help Sherry Schmidt deal with her emotional/non-hunger eating behaviors.  Orders and follow up as documented in patient record. Increase Zoloft to 75 mg (take one 50 mg tab and one 25 mg tab) daily. Continue regular therapy sessions and regular exercise.  - sertraline (ZOLOFT) 50 MG tablet; Take one tablet by mouth daily  Dispense: 30 tablet; Refill: 0 - sertraline (ZOLOFT) 25 MG tablet; Take 1 tablet (25 mg total) by mouth daily.  Dispense: 30 tablet; Refill: 0  3. Class 3 severe obesity with serious comorbidity and body mass index (BMI) of 40.0 to 44.9 in adult, unspecified obesity type (Sherry Schmidt) Sherry Schmidt is currently in the action stage of change. As such, her goal is to continue with weight loss efforts. She has agreed to the Category 4 Plan and keeping a food journal and adhering to recommended goals of 550-700 calories and 45 g protein with supper.   Exercise goals: As is  Behavioral modification strategies: increasing lean protein intake, meal planning and cooking strategies and planning for success.  Sherry Schmidt  has agreed to follow-up with our clinic in 2 weeks. She was informed of the importance of frequent follow-up visits to maximize her success with intensive lifestyle modifications for her multiple health conditions.  Objective:   Sherry Schmidt: Per patient if applicable, see  Sherry Schmidt. GENERAL: Alert and in no acute distress. CARDIOPULMONARY: No increased WOB. Speaking in clear sentences.  PSYCH: Pleasant and cooperative. Speech normal rate and rhythm. Affect is appropriate. Insight and judgement are appropriate. Attention is focused, linear, and appropriate.  NEURO: Oriented as arrived to appointment on time with no prompting.   Lab Results  Component Value Date   CREATININE 0.69 03/06/2020   BUN 13 03/06/2020   NA 140 03/06/2020   K 4.1 03/06/2020   CL 104 03/06/2020   CO2 20 03/06/2020   Lab Results  Component Value Date   ALT 26 03/06/2020   AST 20 03/06/2020   ALKPHOS 110 03/06/2020   BILITOT <0.2 03/06/2020   Lab Results  Component Value Date   HGBA1C 5.4 03/06/2020   HGBA1C 5.6 11/10/2019   Lab Results  Component Value Date   INSULIN 80.9 (H) 03/06/2020   INSULIN 29.3 (H) 11/10/2019   Lab Results  Component Value Date   TSH 4.070 03/06/2020   Lab Results  Component Value Date   CHOL 218 (H) 11/10/2019   HDL 47 11/10/2019   LDLCALC 125 (H) 11/10/2019   TRIG 263 (H) 11/10/2019   Lab Results  Component Value Date   WBC 12.8 (H) 11/10/2019   HGB 15.5 11/10/2019   HCT 47.8 (H) 11/10/2019   MCV 90 11/10/2019   PLT 300 11/10/2019   No results found for: Sherry, TIBC, FERRITIN  Attestation Statements:   Reviewed by clinician on day of visit: allergies, medications, problem list, medical history, surgical history, family history, social history, and previous encounter notes.  Time spent on visit including pre-visit chart review and post-visit charting and care was 35 minutes.   Coral Ceo, am acting as Location manager for Mina Marble, NP.  I have reviewed the above documentation for accuracy and completeness, and I agree with the above. - Sheetal Lyall d. Jhoan Schmieder, NP-C

## 2020-06-13 ENCOUNTER — Encounter (INDEPENDENT_AMBULATORY_CARE_PROVIDER_SITE_OTHER): Payer: Self-pay

## 2020-06-13 ENCOUNTER — Ambulatory Visit (INDEPENDENT_AMBULATORY_CARE_PROVIDER_SITE_OTHER): Payer: BC Managed Care – PPO | Admitting: Adult Health

## 2020-06-27 ENCOUNTER — Other Ambulatory Visit (INDEPENDENT_AMBULATORY_CARE_PROVIDER_SITE_OTHER): Payer: Self-pay | Admitting: Adult Health

## 2020-06-27 DIAGNOSIS — E559 Vitamin D deficiency, unspecified: Secondary | ICD-10-CM

## 2020-06-27 DIAGNOSIS — F418 Other specified anxiety disorders: Secondary | ICD-10-CM

## 2020-06-27 NOTE — Telephone Encounter (Signed)
Last seen by Katy Danford, FNP. 

## 2020-06-28 MED ORDER — VITAMIN D (ERGOCALCIFEROL) 1.25 MG (50000 UNIT) PO CAPS
50000.0000 [IU] | ORAL_CAPSULE | ORAL | 0 refills | Status: DC
Start: 2020-06-28 — End: 2020-07-12

## 2020-06-28 MED ORDER — SERTRALINE HCL 50 MG PO TABS
ORAL_TABLET | ORAL | 0 refills | Status: DC
Start: 2020-06-28 — End: 2020-07-31

## 2020-06-28 MED ORDER — SERTRALINE HCL 25 MG PO TABS: 25.0000 mg | ORAL_TABLET | Freq: Every day | ORAL | 0 refills | Status: DC

## 2020-06-28 NOTE — Telephone Encounter (Signed)
Refill request

## 2020-07-12 ENCOUNTER — Other Ambulatory Visit: Payer: Self-pay

## 2020-07-12 ENCOUNTER — Encounter (INDEPENDENT_AMBULATORY_CARE_PROVIDER_SITE_OTHER): Payer: Self-pay | Admitting: Adult Health

## 2020-07-12 ENCOUNTER — Ambulatory Visit (INDEPENDENT_AMBULATORY_CARE_PROVIDER_SITE_OTHER): Payer: BC Managed Care – PPO | Admitting: Adult Health

## 2020-07-12 VITALS — BP 102/66 | HR 67 | Temp 97.9°F | Ht 73.0 in | Wt 316.0 lb

## 2020-07-12 DIAGNOSIS — Z9189 Other specified personal risk factors, not elsewhere classified: Secondary | ICD-10-CM | POA: Diagnosis not present

## 2020-07-12 DIAGNOSIS — E66813 Obesity, class 3: Secondary | ICD-10-CM

## 2020-07-12 DIAGNOSIS — E8881 Metabolic syndrome: Secondary | ICD-10-CM

## 2020-07-12 DIAGNOSIS — E038 Other specified hypothyroidism: Secondary | ICD-10-CM

## 2020-07-12 DIAGNOSIS — E559 Vitamin D deficiency, unspecified: Secondary | ICD-10-CM

## 2020-07-12 DIAGNOSIS — E88819 Insulin resistance, unspecified: Secondary | ICD-10-CM

## 2020-07-12 DIAGNOSIS — Z6841 Body Mass Index (BMI) 40.0 and over, adult: Secondary | ICD-10-CM

## 2020-07-12 MED ORDER — VITAMIN D (ERGOCALCIFEROL) 1.25 MG (50000 UNIT) PO CAPS
50000.0000 [IU] | ORAL_CAPSULE | ORAL | 0 refills | Status: DC
Start: 2020-07-12 — End: 2020-08-14

## 2020-07-12 MED ORDER — METFORMIN HCL 500 MG PO TABS: ORAL_TABLET | ORAL | 0 refills | Status: DC

## 2020-07-12 MED ORDER — LEVOTHYROXINE SODIUM 100 MCG PO TABS
100.0000 ug | ORAL_TABLET | Freq: Every day | ORAL | 0 refills | Status: DC
Start: 2020-07-12 — End: 2020-10-04

## 2020-07-13 NOTE — Progress Notes (Signed)
Chief Complaint:   OBESITY Sherry Schmidt is here to discuss her progress with her obesity treatment plan along with follow-up of her obesity related diagnoses. Sherry Schmidt is on the Category 4 Plan and keeping a food journal and adhering to recommended goals of 550-700 calories and 45 protein and states she is following her eating plan approximately 89% of the time. Hailly states she is going to the gym 60-90 minutes 5 times per week.  Today's visit was #: 10 Starting weight: 312 lbs Starting date: 11/10/2019 Today's weight: 316 lbs Today's date: 07/12/2020 Total lbs lost to date: 0 Total lbs lost since last in-office visit: 0  Interim History: Sherry Schmidt's gym workout consists of 2 miles on treadmill, ab work, rotates between arms and legs. She has been consistently following category 4 meal plan. Bioimpedance reveals a large increase in water weight since last OV.  Subjective:   1. Vitamin D deficiency Sherry Schmidt's Vitamin D level was 17.5 on 03/05/2020. Prescription vitamin D 50,000 IU was increased to twice a week due to sub-therapeutic Vit D level despite weekly prescription supplementation. She denies nausea, vomiting or muscle weakness.  2. Other specified hypothyroidism 03/06/2020 thyroid panel was stable. Sherry Schmidt is on levothyroxine 100 mcg and reports stable energy level.  3. Insulin resistance 03/06/2020 CMP GFR 122, A1c and BG both normal. 03/06/2020 insulin level 80.9, increased from 29.3 on 11/10/2019.  Lab Results  Component Value Date   INSULIN 80.9 (H) 03/06/2020   INSULIN 29.3 (H) 11/10/2019   Lab Results  Component Value Date   HGBA1C 5.4 03/06/2020    4. At risk for diarrhea Sherry Schmidt is at higher risk of diarrhea due to starting Metformin for insulin resistance.  Assessment/Plan:   1. Vitamin D deficiency Low Vitamin D level contributes to fatigue and are associated with obesity, breast, and colon cancer. She agrees to continue to take prescription Vitamin  D @50 ,000 IU every 3 days and will follow-up for routine testing of Vitamin D, at least 2-3 times per year to avoid over-replacement.  - Vitamin D, Ergocalciferol, (DRISDOL) 1.25 MG (50000 UNIT) CAPS capsule; Take 1 capsule (50,000 Units total) by mouth every 3 (three) days.  Dispense: 8 capsule; Refill: 0  2. Other specified hypothyroidism Patient with long-standing hypothyroidism, on levothyroxine therapy. She appears euthyroid. Orders and follow up as documented in patient record.  Counseling . Good thyroid control is important for overall health. Supratherapeutic thyroid levels are dangerous and will not improve weight loss results. . The correct way to take levothyroxine is fasting, with water, separated by at least 30 minutes from breakfast, and separated by more than 4 hours from calcium, iron, multivitamins, acid reflux medications (PPIs).   - levothyroxine (SYNTHROID) 100 MCG tablet; Take 1 tablet (100 mcg total) by mouth daily before breakfast.  Dispense: 90 tablet; Refill: 0  3. Insulin resistance Sherry Schmidt will continue to work on weight loss, exercise, and decreasing simple carbohydrates to help decrease the risk of diabetes. Sherry Schmidt agreed to follow-up with [place] as directed to closely monitor her progress. Start Metformin 500 mg, as prescribed below.  - metFORMIN (GLUCOPHAGE) 500 MG tablet; 1/2 tab with breakfast for one week then increase to 1 full tab with breakfast  Dispense: 30 tablet; Refill: 0  4. At risk for diarrhea Sherry Schmidt was given approximately 15 minutes of diarrhea prevention counseling today. She is 25 y.o. female and has risk factors for diarrhea including medications and changes in diet. We discussed intensive lifestyle modifications today with an emphasis  on specific weight loss instructions including dietary strategies.   Repetitive spaced learning was employed today to elicit superior memory formation and behavioral change.  5. Class 3 severe obesity with  serious comorbidity and body mass index (BMI) of 40.0 to 44.9 in adult, unspecified obesity type ([place]) Sherry Schmidt is currently in the action stage of change. As such, her goal is to continue with weight loss efforts. She has agreed to the Category 4 Plan.   Check Vit D at next OV. Check fasting labs in 2 OV's.  Exercise goals: As is  Behavioral modification strategies: increasing lean protein intake, decreasing simple carbohydrates, meal planning and cooking strategies, keeping healthy foods in the home and planning for success.  Sherry Schmidt has agreed to follow-up with our clinic in 3 weeks. She was informed of the importance of frequent follow-up visits to maximize her success with intensive lifestyle modifications for her multiple health conditions.   Objective:   Blood pressure 102/66, pulse 67, temperature 97.9 F (36.6 C), height 6\' 1"  (1.854 m), weight (!) 316 lb (143.3 kg), SpO2 98 %. Body mass index is 41.69 kg/m.  General: Cooperative, alert, well developed, in no acute distress. HEENT: Conjunctivae and lids unremarkable. Cardiovascular: Regular rhythm.  Lungs: Normal work of breathing. Neurologic: No focal deficits.   Lab Results  Component Value Date   CREATININE 0.69 03/06/2020   BUN 13 03/06/2020   NA 140 03/06/2020   K 4.1 03/06/2020   CL 104 03/06/2020   CO2 20 03/06/2020   Lab Results  Component Value Date   ALT 26 03/06/2020   AST 20 03/06/2020   ALKPHOS 110 03/06/2020   BILITOT <0.2 03/06/2020   Lab Results  Component Value Date   HGBA1C 5.4 03/06/2020   HGBA1C 5.6 11/10/2019   Lab Results  Component Value Date   INSULIN 80.9 (H) 03/06/2020   INSULIN 29.3 (H) 11/10/2019   Lab Results  Component Value Date   TSH 4.070 03/06/2020   Lab Results  Component Value Date   CHOL 218 (H) 11/10/2019   HDL 47 11/10/2019   LDLCALC 125 (H) 11/10/2019   TRIG 263 (H) 11/10/2019   Lab Results  Component Value Date   WBC 12.8 (H) 11/10/2019   HGB 15.5  11/10/2019   HCT 47.8 (H) 11/10/2019   MCV 90 11/10/2019   PLT 300 11/10/2019    Attestation Statements:   Reviewed by clinician on day of visit: allergies, medications, problem list, medical history, surgical history, family history, social history, and previous encounter notes.  Coral Ceo, am acting as Location manager for Mina Marble, NP.  I have reviewed the above documentation for accuracy and completeness, and I agree with the above. -  Mayli Covington d. Maryanna Stuber, NP-C

## 2020-07-31 ENCOUNTER — Other Ambulatory Visit (INDEPENDENT_AMBULATORY_CARE_PROVIDER_SITE_OTHER): Payer: Self-pay | Admitting: Adult Health

## 2020-07-31 DIAGNOSIS — E559 Vitamin D deficiency, unspecified: Secondary | ICD-10-CM

## 2020-07-31 DIAGNOSIS — F418 Other specified anxiety disorders: Secondary | ICD-10-CM

## 2020-07-31 MED ORDER — SERTRALINE HCL 25 MG PO TABS: 25.0000 mg | ORAL_TABLET | Freq: Every day | ORAL | 0 refills | Status: DC

## 2020-07-31 MED ORDER — SERTRALINE HCL 50 MG PO TABS
ORAL_TABLET | ORAL | 0 refills | Status: DC
Start: 2020-07-31 — End: 2020-09-01

## 2020-07-31 NOTE — Telephone Encounter (Signed)
Need Vit d Level prior to next RF

## 2020-07-31 NOTE — Telephone Encounter (Signed)
Pt last seen by Katy Danford, FNP.  

## 2020-07-31 NOTE — Telephone Encounter (Signed)
Refill request

## 2020-08-02 ENCOUNTER — Ambulatory Visit (INDEPENDENT_AMBULATORY_CARE_PROVIDER_SITE_OTHER): Payer: BC Managed Care – PPO | Admitting: Physician Assistant

## 2020-08-14 ENCOUNTER — Other Ambulatory Visit: Payer: Self-pay

## 2020-08-14 ENCOUNTER — Ambulatory Visit (INDEPENDENT_AMBULATORY_CARE_PROVIDER_SITE_OTHER): Payer: BC Managed Care – PPO | Admitting: Physician Assistant

## 2020-08-14 ENCOUNTER — Encounter (INDEPENDENT_AMBULATORY_CARE_PROVIDER_SITE_OTHER): Payer: Self-pay | Admitting: Physician Assistant

## 2020-08-14 VITALS — BP 135/78 | HR 60 | Temp 98.4°F | Ht 73.0 in | Wt 308.0 lb

## 2020-08-14 DIAGNOSIS — E8881 Metabolic syndrome: Secondary | ICD-10-CM

## 2020-08-14 DIAGNOSIS — R7303 Prediabetes: Secondary | ICD-10-CM

## 2020-08-14 DIAGNOSIS — Z9189 Other specified personal risk factors, not elsewhere classified: Secondary | ICD-10-CM | POA: Diagnosis not present

## 2020-08-14 DIAGNOSIS — Z6841 Body Mass Index (BMI) 40.0 and over, adult: Secondary | ICD-10-CM | POA: Diagnosis not present

## 2020-08-14 DIAGNOSIS — E559 Vitamin D deficiency, unspecified: Secondary | ICD-10-CM

## 2020-08-14 MED ORDER — VITAMIN D (ERGOCALCIFEROL) 1.25 MG (50000 UNIT) PO CAPS: 50000.0000 [IU] | ORAL_CAPSULE | ORAL | 0 refills | Status: DC

## 2020-08-14 MED ORDER — METFORMIN HCL 500 MG PO TABS
ORAL_TABLET | ORAL | 0 refills | Status: DC
Start: 2020-08-14 — End: 2021-09-20

## 2020-08-15 NOTE — Progress Notes (Signed)
Chief Complaint:   OBESITY Sherry Schmidt is here to discuss her progress with her obesity treatment plan along with follow-up of her obesity related diagnoses. [name] is on the Category 4 Plan and states she is following her eating plan approximately 93% of the time. [name] states she is doing cardio and strengthening for 90 minutes 5 times per week.  Today's visit was #: 11 Starting weight: 312 lbs Starting date: 11/10/2019 Today's weight: 308 lbs Today's date: 08/14/2020 Total lbs lost to date: 4 Total lbs lost since last in-office visit: 8  Interim History: [name] did great with weight loss. She is tired of Category 4 and she would like more variation in her plan.  Subjective:   1. Pre-diabetes [name] is on metformin, and she denies nausea, vomiting, or diarrhea.   2. Vitamin D deficiency [name] is on Vit D, and she denies nausea, vomiting, or muscle weakness.  3. At risk for diabetes mellitus [name] is at higher than average risk for developing diabetes due to obesity.   Assessment/Plan:   1. Pre-diabetes [name] will continue to work on weight loss, exercise, and decreasing simple carbohydrates to help decrease the risk of diabetes. We will refill metformin for 1 month.  - metFORMIN (GLUCOPHAGE) 500 MG tablet; 1/2 tab with breakfast for one week then increase to 1 full tab with breakfast  Dispense: 30 tablet; Refill: 0  2. Vitamin D deficiency Low Vitamin D level contributes to fatigue and are associated with obesity, breast, and colon cancer. We will refill prescription Vitamin D for 1 month. [name] will follow-up for routine testing of Vitamin D, at least 2-3 times per year to avoid over-replacement.  - Vitamin D, Ergocalciferol, (DRISDOL) 1.25 MG (50000 UNIT) CAPS capsule; Take 1 capsule (50,000 Units total) by mouth every 3 (three) days.  Dispense: 8 capsule; Refill: 0  3. At risk for diabetes mellitus [name] was given approximately 15  minutes of diabetes education and counseling today. We discussed intensive lifestyle modifications today with an emphasis on weight loss as well as increasing exercise and decreasing simple carbohydrates in her diet. We also reviewed medication options with an emphasis on risk versus benefit of those discussed.   Repetitive spaced learning was employed today to elicit superior memory formation and behavioral change.  4. Class 3 severe obesity with serious comorbidity and body mass index (BMI) of 40.0 to 44.9 in adult, unspecified obesity type ([place]) [name] is currently in the action stage of change. As such, her goal is to continue with weight loss efforts. She has agreed to keeping a food journal and adhering to recommended goals of 1800-1900 calories and 115 grams of protein daily.   We will recheck fasting labs at her next visit.  Exercise goals: As is.  Behavioral modification strategies: keeping healthy foods in the home and ways to avoid boredom eating.  [name] has agreed to follow-up with our clinic in 3 weeks. She was informed of the importance of frequent follow-up visits to maximize her success with intensive lifestyle modifications for her multiple health conditions.   Objective:   Blood pressure 135/78, pulse 60, temperature 98.4 F (36.9 C), height 6\' 1"  (1.854 m), weight (!) 308 lb (139.7 kg), SpO2 98 %. Body mass index is 40.64 kg/m.  General: Cooperative, alert, well developed, in no acute distress. HEENT: Conjunctivae and lids unremarkable. Cardiovascular: Regular rhythm.  Lungs: Normal work of breathing. Neurologic: No focal deficits.   Lab Results  Component Value Date   CREATININE 0.69  03/06/2020   BUN 13 03/06/2020   NA 140 03/06/2020   K 4.1 03/06/2020   CL 104 03/06/2020   CO2 20 03/06/2020   Lab Results  Component Value Date   ALT 26 03/06/2020   AST 20 03/06/2020   ALKPHOS 110 03/06/2020   BILITOT <0.2 03/06/2020   Lab Results  Component  Value Date   HGBA1C 5.4 03/06/2020   HGBA1C 5.6 11/10/2019   Lab Results  Component Value Date   INSULIN 80.9 (H) 03/06/2020   INSULIN 29.3 (H) 11/10/2019   Lab Results  Component Value Date   TSH 4.070 03/06/2020   Lab Results  Component Value Date   CHOL 218 (H) 11/10/2019   HDL 47 11/10/2019   LDLCALC 125 (H) 11/10/2019   TRIG 263 (H) 11/10/2019   Lab Results  Component Value Date   WBC 12.8 (H) 11/10/2019   HGB 15.5 11/10/2019   HCT 47.8 (H) 11/10/2019   MCV 90 11/10/2019   PLT 300 11/10/2019   No results found for: IRON, TIBC, FERRITIN  Attestation Statements:   Reviewed by clinician on day of visit: allergies, medications, problem list, medical history, surgical history, family history, social history, and previous encounter notes.   Wilhemena Durie, am acting as transcriptionist for [employer], PA-C.  I have reviewed the above documentation for accuracy and completeness, and I agree with the above. Abby Potash, PA-C '

## 2020-09-01 ENCOUNTER — Other Ambulatory Visit (INDEPENDENT_AMBULATORY_CARE_PROVIDER_SITE_OTHER): Payer: Self-pay | Admitting: Physician Assistant

## 2020-09-01 ENCOUNTER — Other Ambulatory Visit (INDEPENDENT_AMBULATORY_CARE_PROVIDER_SITE_OTHER): Payer: Self-pay | Admitting: Adult Health

## 2020-09-01 DIAGNOSIS — F418 Other specified anxiety disorders: Secondary | ICD-10-CM

## 2020-09-01 DIAGNOSIS — R7303 Prediabetes: Secondary | ICD-10-CM

## 2020-09-04 MED ORDER — SERTRALINE HCL 25 MG PO TABS
25.0000 mg | ORAL_TABLET | Freq: Every day | ORAL | 0 refills | Status: DC
Start: 2020-09-04 — End: 2020-10-04

## 2020-09-04 MED ORDER — SERTRALINE HCL 50 MG PO TABS: ORAL_TABLET | ORAL | 0 refills | Status: DC

## 2020-09-04 NOTE — Telephone Encounter (Signed)
Pt last seen by Tracey Aguilar, PA-C.  

## 2020-09-04 NOTE — Telephone Encounter (Signed)
Next appt is Wed 6/8. Okay to refill?

## 2020-09-06 ENCOUNTER — Ambulatory Visit (INDEPENDENT_AMBULATORY_CARE_PROVIDER_SITE_OTHER): Payer: BC Managed Care – PPO | Admitting: Physician Assistant

## 2020-09-09 ENCOUNTER — Emergency Department (INDEPENDENT_AMBULATORY_CARE_PROVIDER_SITE_OTHER): Payer: BC Managed Care – PPO

## 2020-09-09 ENCOUNTER — Emergency Department (INDEPENDENT_AMBULATORY_CARE_PROVIDER_SITE_OTHER)
Admission: EM | Admit: 2020-09-09 | Discharge: 2020-09-09 | Disposition: A | Discharge status: Home / Self Care | Payer: BC Managed Care – PPO | Source: Home / Self Care

## 2020-09-09 ENCOUNTER — Other Ambulatory Visit: Payer: Self-pay

## 2020-09-09 ENCOUNTER — Encounter: Payer: Self-pay | Admitting: Emergency Medicine

## 2020-09-09 DIAGNOSIS — S93401A Sprain of unspecified ligament of right ankle, initial encounter: Secondary | ICD-10-CM | POA: Diagnosis not present

## 2020-09-09 DIAGNOSIS — M79671 Pain in right foot: Secondary | ICD-10-CM

## 2020-09-09 DIAGNOSIS — S93601A Unspecified sprain of right foot, initial encounter: Secondary | ICD-10-CM | POA: Diagnosis not present

## 2020-09-09 DIAGNOSIS — M25571 Pain in right ankle and joints of right foot: Secondary | ICD-10-CM | POA: Diagnosis not present

## 2020-09-09 MED ORDER — ACETAMINOPHEN 325 MG PO TABS
650.0000 mg | ORAL_TABLET | Freq: Once | ORAL | Status: AC
  Administered 2020-09-09: 650 mg via ORAL

## 2020-09-09 MED ORDER — IBUPROFEN 800 MG PO TABS
800.0000 mg | ORAL_TABLET | Freq: Three times a day (TID) | ORAL | 0 refills | Status: DC
Start: 2020-09-09 — End: 2021-08-07

## 2020-09-09 NOTE — ED Provider Notes (Signed)
S[facility] Schmidt [facility]    CSN: [id number] Arrival date & time: 09/09/20  1207      History   Chief Complaint Chief Complaint  Patient presents with   Foot Injury   Ankle Injury    Right     HPI Sherry Schmidt is a 25 y.o. female.   HPI 25 year old female presents with right ankle/right foot pain for 3 hours.  Patient reports stepping into a hole in her backyard and noticed immediate pain and swelling of her right foot and ankle.  Past Medical History:  Diagnosis Date   Asthma    Back pain    Back pain    Depression    Panic attacks    Thyroid disease     Patient Active Problem List   Diagnosis Date Noted   Hypothyroidism 05/12/2020   Transaminitis 03/06/2020   Abnormal thyroid blood test 03/06/2020   Mixed hyperlipidemia 03/06/2020   Vitamin D deficiency 02/18/2020   Insulin resistance 02/18/2020   Depression with anxiety 02/18/2020   Class 3 severe obesity with serious comorbidity and body mass index (BMI) of 40.0 to 44.9 in adult [facility]) 02/18/2020   Fatigue 11/06/2016   Postprocedural hypothyroidism 10/16/2016    Past Surgical History:  Procedure Laterality Date   THYROIDECTOMY      OB History     Gravida  0   Para  0   Term  0   Preterm  0   AB  0   Living  0      SAB  0   IAB  0   Ectopic  0   Multiple  0   Live Births  0            Home Medications    Prior to Admission medications   Medication Sig Start Date End Date Taking? Authorizing Provider  ibuprofen (ADVIL) 800 MG tablet Take 1 tablet (800 mg total) by mouth 3 (three) times daily. 09/09/20  Yes Eliezer Lofts, FNP  levothyroxine (SYNTHROID) 100 MCG tablet Take 1 tablet (100 mcg total) by mouth daily before breakfast. 07/12/20  Yes Danford, Valetta Fuller D, NP  metFORMIN (GLUCOPHAGE) 500 MG tablet 1/2 tab with breakfast for one week then increase to 1 full tab with breakfast 08/14/20  Yes Abby Potash, PA-C  sertraline (ZOLOFT) 25 MG tablet Take 1 tablet (25 mg total)  by mouth daily. 09/04/20  Yes Abby Potash, PA-C  sertraline (ZOLOFT) 50 MG tablet Take one tablet by mouth daily 09/04/20  Yes Abby Potash, PA-C  Vitamin D, Ergocalciferol, (DRISDOL) 1.25 MG (50000 UNIT) CAPS capsule Take 1 capsule (50,000 Units total) by mouth every 3 (three) days. 08/14/20  Yes Abby Potash, PA-C  albuterol (PROVENTIL HFA;VENTOLIN HFA) 108 (90 Base) MCG/ACT inhaler Inhale 2 puffs into the lungs every 6 (six) hours as needed for wheezing or shortness of breath.    [provider]  diphenhydrAMINE (BENADRYL) 25 mg capsule Take 25 mg by mouth every 6 (six) hours as needed.    [provider]  fluticasone (FLONASE) 50 MCG/ACT nasal spray Place 2 sprays into both nostrils daily. 08/06/19   Palumbo, April, MD    Family History Family History  Problem Relation Age of Onset   Hypertension Mother    Depression Mother    Bipolar disorder Mother    Drug abuse Mother    Obesity Mother    Diabetes Father    Thyroid disease Father    Cancer Father    Depression Father  Anxiety disorder Father    Bipolar disorder Father    Schizophrenia Father    Liver disease Father    Sleep apnea Father    Alcohol abuse Father    Drug abuse Father    Obesity Father     Social History Social History   Tobacco Use   Smoking status: Never   Smokeless tobacco: Never  Vaping Use   Vaping Use: Never used  Substance Use Topics   Alcohol use: Yes    Comment: occasional   Drug use: No     Allergies   Patient has no known allergies.   Review of Systems Review of Systems  Constitutional: Negative.   HENT: Negative.    Eyes: Negative.   Respiratory: Negative.    Cardiovascular: Negative.   Gastrointestinal: Negative.   Genitourinary: Negative.   Musculoskeletal:        Right ankle/right foot pain x3 hours  Skin: Negative.   Neurological: Negative.     Physical Exam Triage Vital Signs ED Triage Vitals  Enc Vitals Group     BP 09/09/20 1259 117/81      Pulse Rate 09/09/20 1259 76     Resp 09/09/20 1259 16     Temp 09/09/20 1259 99.3 F (37.4 C)     Temp Source 09/09/20 1259 Oral     SpO2 09/09/20 1259 98 %     Weight --      Height --      Head Circumference --      Peak Flow --      Pain Score 09/09/20 1301 6     Pain Loc --      Pain Edu? --      Excl. in [place]? --    No data found.  Updated Vital Signs BP 117/81 (BP Location: Right Arm)   Pulse 76   Temp 99.3 F (37.4 C) (Oral)   Resp 16   SpO2 98%   Physical Exam Constitutional:      General: She is not in acute distress.    Appearance: Normal appearance. She is obese. She is not ill-appearing.  HENT:     Head: Normocephalic and atraumatic.     Mouth/Throat:     Mouth: Mucous membranes are moist.     Pharynx: Oropharynx is clear.  Eyes:     Extraocular Movements: Extraocular movements intact.     Conjunctiva/sclera: Conjunctivae normal.     Pupils: Pupils are equal, round, and reactive to light.  Cardiovascular:     Rate and Rhythm: Normal rate and regular rhythm.     Pulses: Normal pulses.     Heart sounds: Normal heart sounds.  Pulmonary:     Effort: Pulmonary effort is normal.     Breath sounds: Normal breath sounds.     Comments: No adventitious breath sounds Musculoskeletal:     Cervical back: Normal range of motion and neck supple.     Comments: Right ankle/right foot: TTP over lateral malleolus, and anterior superior dorsum, LROM with dorsi/plantar flexion, exam limited due to pain  Skin:    General: Skin is warm and dry.  Neurological:     General: No focal deficit present.     Mental Status: She is alert and oriented to person, place, and time.  Psychiatric:        Mood and Affect: Mood normal.        Behavior: Behavior normal.     UC Treatments / Results  Labs (all labs  ordered are listed, but only abnormal results are displayed) Labs Reviewed - No data to display  EKG   Radiology DG Ankle Complete Right  Result Date:  09/09/2020 CLINICAL DATA:  Right foot and ankle pain after stepping in a hole earlier today EXAM: RIGHT ANKLE - COMPLETE 3+ VIEW COMPARISON:  Concurrently obtained radiographs of the right foot FINDINGS: There is no evidence of fracture, dislocation, or joint effusion. There is no evidence of arthropathy or other focal bone abnormality. Soft tissues are unremarkable. IMPRESSION: Negative. Electronically Signed   By: Jacqulynn Cadet M.D.   On: 09/09/2020 13:33   DG Foot Complete Right  Result Date: 09/09/2020 CLINICAL DATA:  Right foot and ankle pain after stepping in a hole earlier today EXAM: RIGHT FOOT COMPLETE - 3+ VIEW COMPARISON:  Concurrently obtained radiographs of the right ankle FINDINGS: There is no evidence of fracture or dislocation. There is no evidence of arthropathy or other focal bone abnormality. Soft tissues are unremarkable. IMPRESSION: Negative. Electronically Signed   By: Jacqulynn Cadet M.D.   On: 09/09/2020 13:33    Procedures Procedures (including critical care time)  Medications Ordered in UC Medications  acetaminophen (TYLENOL) tablet 650 mg (650 mg Oral Given 09/09/20 1305)    Initial Impression / Assessment and Plan / UC Course  I have reviewed the triage vital signs and the nursing notes.  Pertinent labs & imaging results that were available during my care of the patient were reviewed by me and considered in my medical decision making (see chart for details).     MDM: 1.  Right ankle sprain, 2.  Right foot sprain.  Discharged home, hemodynamically stable. Final Clinical Impressions(s) / UC Diagnoses   Final diagnoses:  Acute right ankle pain  Sprain of right ankle, unspecified ligament, initial encounter  Right foot pain  Sprain of right foot, initial encounter     Discharge Instructions      Advised patient may take ibuprofen 800 mg 2 times daily, as needed for the next 7 to 10 days.  Advised patient to RICE affected area of right ankle on right  foot for 20 minutes 2-3 times daily for the next 3 days.  Advised patient to avoid moderate/strenuous activities and/or repetitive movements involving right ankle/right foot for the next 7 to 10 days.     ED Prescriptions     Medication Sig Dispense Auth. Provider   ibuprofen (ADVIL) 800 MG tablet Take 1 tablet (800 mg total) by mouth 3 (three) times daily. 30 tablet Eliezer Lofts, FNP      PDMP not reviewed this encounter.   Eliezer Lofts, [place] 09/09/20 1352

## 2020-09-09 NOTE — Discharge Instructions (Addendum)
Advised patient may take ibuprofen 800 mg 2 times daily, as needed for the next 7 to 10 days.  Advised patient to RICE affected area of right ankle on right foot for 20 minutes 2-3 times daily for the next 3 days.  Advised patient to avoid moderate/strenuous activities and/or repetitive movements involving right ankle/right foot for the next 7 to 10 days.

## 2020-09-09 NOTE — ED Triage Notes (Signed)
Approx 3 hours ago pt stepped in a hole in the yard - immediate pain to R foot & ankle Limited ROM  Swelling noted  Tylenol given in triage

## 2020-10-04 ENCOUNTER — Encounter (INDEPENDENT_AMBULATORY_CARE_PROVIDER_SITE_OTHER): Payer: Self-pay

## 2020-10-04 ENCOUNTER — Other Ambulatory Visit (INDEPENDENT_AMBULATORY_CARE_PROVIDER_SITE_OTHER): Payer: Self-pay | Admitting: Physician Assistant

## 2020-10-04 ENCOUNTER — Other Ambulatory Visit (INDEPENDENT_AMBULATORY_CARE_PROVIDER_SITE_OTHER): Payer: Self-pay | Admitting: Adult Health

## 2020-10-04 DIAGNOSIS — F418 Other specified anxiety disorders: Secondary | ICD-10-CM

## 2020-10-04 DIAGNOSIS — R7303 Prediabetes: Secondary | ICD-10-CM

## 2020-10-04 DIAGNOSIS — E559 Vitamin D deficiency, unspecified: Secondary | ICD-10-CM

## 2020-10-04 DIAGNOSIS — E038 Other specified hypothyroidism: Secondary | ICD-10-CM

## 2020-10-04 MED ORDER — LEVOTHYROXINE SODIUM 100 MCG PO TABS: 100.0000 ug | ORAL_TABLET | Freq: Every day | ORAL | 0 refills | Status: DC

## 2020-10-04 MED ORDER — SERTRALINE HCL 50 MG PO TABS: ORAL_TABLET | ORAL | 0 refills | Status: DC

## 2020-10-04 MED ORDER — SERTRALINE HCL 25 MG PO TABS: 25.0000 mg | ORAL_TABLET | Freq: Every day | ORAL | 0 refills | Status: DC

## 2020-10-04 NOTE — Telephone Encounter (Signed)
Ok to refill?  Last OV 08/14/20, Zoloft last filled 6/6,  metformin and vit D last filled 5/16. Next appt 10/19/20

## 2020-10-04 NOTE — Telephone Encounter (Signed)
May fill zoloft and levothyroxine. Everything else will be refilled during visit. She also needs bloodwork. thanks

## 2020-10-04 NOTE — Telephone Encounter (Signed)
Ok to refill?  Last OV 5/16, Synthroid last filled 4/13, Next appt scheduled 7/21

## 2020-10-19 ENCOUNTER — Ambulatory Visit (INDEPENDENT_AMBULATORY_CARE_PROVIDER_SITE_OTHER): Payer: BC Managed Care – PPO | Admitting: Physician Assistant

## 2020-11-02 ENCOUNTER — Encounter (INDEPENDENT_AMBULATORY_CARE_PROVIDER_SITE_OTHER): Payer: Self-pay

## 2020-11-07 ENCOUNTER — Ambulatory Visit (INDEPENDENT_AMBULATORY_CARE_PROVIDER_SITE_OTHER): Payer: Self-pay | Admitting: Physician Assistant

## 2020-11-13 ENCOUNTER — Encounter (INDEPENDENT_AMBULATORY_CARE_PROVIDER_SITE_OTHER): Payer: Self-pay | Admitting: Adult Health

## 2020-11-13 ENCOUNTER — Other Ambulatory Visit (INDEPENDENT_AMBULATORY_CARE_PROVIDER_SITE_OTHER): Payer: Self-pay

## 2020-11-13 DIAGNOSIS — F418 Other specified anxiety disorders: Secondary | ICD-10-CM

## 2020-11-13 MED ORDER — SERTRALINE HCL 50 MG PO TABS: ORAL_TABLET | ORAL | 0 refills | Status: DC

## 2020-11-13 MED ORDER — SERTRALINE HCL 25 MG PO TABS: 25.0000 mg | ORAL_TABLET | Freq: Every day | ORAL | 0 refills | Status: DC

## 2020-11-13 NOTE — Telephone Encounter (Signed)
Please advise 

## 2020-11-13 NOTE — Telephone Encounter (Signed)
LAST APPOINTMENT DATE: 08/14/2020 NEXT APPOINTMENT DATE: 11/20/2020 Pt cancelled two appts and last appt was cancelled by the provider  [facility] [address], [place] - [place] [address] [place] [place] [address] Phone: [phone] Fax: [phone]  Patient is requesting a refill of the following medications: Requested Prescriptions   Pending Prescriptions Disp Refills   sertraline (ZOLOFT) 25 MG tablet 30 tablet 0    Sig: Take 1 tablet (25 mg total) by mouth daily.   sertraline (ZOLOFT) 50 MG tablet 30 tablet 0    Sig: Take one tablet by mouth daily    Date last filled: 10/04/2020 Previously prescribed by [facility]  Lab Results  Component Value Date   HGBA1C 5.4 03/06/2020   HGBA1C 5.6 11/10/2019   Lab Results  Component Value Date   LDLCALC 125 (H) 11/10/2019   CREATININE 0.69 03/06/2020   Lab Results  Component Value Date   VD25OH 17.5 (L) 03/06/2020   VD25OH 18.8 (L) 11/10/2019    BP Readings from Last 3 Encounters:  09/09/20 117/81  08/14/20 135/78  07/12/20 102/66

## 2020-11-20 ENCOUNTER — Encounter (INDEPENDENT_AMBULATORY_CARE_PROVIDER_SITE_OTHER): Payer: Self-pay | Admitting: Bariatrics

## 2020-11-20 ENCOUNTER — Ambulatory Visit (INDEPENDENT_AMBULATORY_CARE_PROVIDER_SITE_OTHER): Payer: 59 | Admitting: Bariatrics

## 2020-11-20 ENCOUNTER — Other Ambulatory Visit: Payer: Self-pay

## 2020-11-20 VITALS — BP 119/79 | HR 53 | Temp 98.4°F | Ht 73.0 in | Wt 295.0 lb

## 2020-11-20 DIAGNOSIS — R7303 Prediabetes: Secondary | ICD-10-CM | POA: Diagnosis not present

## 2020-11-20 DIAGNOSIS — E559 Vitamin D deficiency, unspecified: Secondary | ICD-10-CM

## 2020-11-20 DIAGNOSIS — Z6841 Body Mass Index (BMI) 40.0 and over, adult: Secondary | ICD-10-CM

## 2020-11-21 ENCOUNTER — Encounter (INDEPENDENT_AMBULATORY_CARE_PROVIDER_SITE_OTHER): Payer: Self-pay | Admitting: Bariatrics

## 2020-11-21 NOTE — Progress Notes (Signed)
Chief Complaint:   OBESITY Sherry Schmidt is here to discuss her progress with her obesity treatment plan along with follow-up of her obesity related diagnoses. Sherry Schmidt is on keeping a food journal and adhering to recommended goals of 1900 calories and 119 grams of protein and states she is following her eating plan approximately 90% of the time. Sherry Schmidt states she is doing cardio for 60 minutes 5 times per week and weights for 90 minutes 5 times per week.  Today's visit was #: 12 Starting weight: 312 lbs Starting date: 11/10/2019 Today's weight: 295 lbs Today's date: 11/20/2020 Total lbs lost to date: 17 lbs Total lbs lost since last in-office visit: 13 lbs  Interim History: Sherry Schmidt is down 13 lbs since her last visit. She was last seen on 08/14/2020 with Cristal Ford, PA-C. She has also seen Mina Marble, NP.  Subjective:   1. Pre-diabetes Sherry Schmidt stopped Metformin.  2. Vitamin D deficiency Sherry Schmidt is currently taking high dose Vitamin D.  Assessment/Plan:   1. Pre-diabetes Sherry Schmidt will continue to work on weight loss, exercise, and decreasing simple carbohydrates to help decrease the risk of diabetes. She will increase healthy fats and protein. She will continue activities.  2. Vitamin D deficiency Low Vitamin D level contributes to fatigue and are associated with obesity, breast, and colon cancer. Sherry Schmidt agrees to continue to take prescription Vitamin D 50,000 IU every week and she will follow-up for routine testing of Vitamin D, at least 2-3 times per year to avoid over-replacement.   3. Obesity, current BMI 38.9 Sherry Schmidt is currently in the action stage of change. As such, her goal is to continue with weight loss efforts. She has agreed to keeping a food journal and adhering to recommended goals of 1900 calories and 119 grams of  protein.   Sherry Schmidt will continue meal planning. She will continue intentional eating. She will continue to adhere closely to the  plan. She will to use her water app.  Exercise goals:  As is. Sherry Schmidt will continue  cardio and strength exercises   Behavioral modification strategies: increasing lean protein intake, decreasing simple carbohydrates, increasing vegetables, increasing water intake, decreasing eating out, no skipping meals, meal planning and cooking strategies, keeping healthy foods in the home, and planning for success.  Sherry Schmidt has agreed to follow-up with our clinic in 2-3 weeks (fasting ) with Mina Marble, NP. She was informed of the importance of frequent follow-up visits to maximize her success with intensive lifestyle modifications for her multiple health conditions.   Objective:   Blood pressure 119/79, pulse (!) 53, temperature 98.4 F (36.9 C), height '6\' 1"'$  (1.854 m), weight 295 lb (133.8 kg), last menstrual period 11/15/2020, SpO2 98 %. Body mass index is 38.92 kg/m.  General: Cooperative, alert, well developed, in no acute distress. HEENT: Conjunctivae and lids unremarkable. Cardiovascular: Regular rhythm.  Lungs: Normal work of breathing. Neurologic: No focal deficits.   Lab Results  Component Value Date   CREATININE 0.69 03/06/2020   BUN 13 03/06/2020   NA 140 03/06/2020   K 4.1 03/06/2020   CL 104 03/06/2020   CO2 20 03/06/2020   Lab Results  Component Value Date   ALT 26 03/06/2020   AST 20 03/06/2020   ALKPHOS 110 03/06/2020   BILITOT <0.2 03/06/2020   Lab Results  Component Value Date   HGBA1C 5.4 03/06/2020   HGBA1C 5.6 11/10/2019   Lab Results  Component Value Date   INSULIN 80.9 (H) 03/06/2020   INSULIN 29.3 (  H) 11/10/2019   Lab Results  Component Value Date   TSH 4.070 03/06/2020   Lab Results  Component Value Date   CHOL 218 (H) 11/10/2019   HDL 47 11/10/2019   LDLCALC 125 (H) 11/10/2019   TRIG 263 (H) 11/10/2019   Lab Results  Component Value Date   VD25OH 17.5 (L) 03/06/2020   VD25OH 18.8 (L) 11/10/2019   Lab Results  Component Value Date    WBC 12.8 (H) 11/10/2019   HGB 15.5 11/10/2019   HCT 47.8 (H) 11/10/2019   MCV 90 11/10/2019   PLT 300 11/10/2019   No results found for: IRON, TIBC, FERRITIN  Attestation Statements:   Reviewed by clinician on day of visit: allergies, medications, problem list, medical history, surgical history, family history, social history, and previous encounter notes.  Time spent on visit including pre-visit chart review and post-visit care and charting was 20 minutes.   I, Lizbeth Bark, RMA, am acting as Location manager for [employer], DO.   I have reviewed the above documentation for accuracy and completeness, and I agree with the above. Sherry Lesch, DO

## 2020-11-22 ENCOUNTER — Encounter (INDEPENDENT_AMBULATORY_CARE_PROVIDER_SITE_OTHER): Payer: Self-pay

## 2020-11-23 ENCOUNTER — Encounter (INDEPENDENT_AMBULATORY_CARE_PROVIDER_SITE_OTHER): Payer: Self-pay

## 2020-12-13 ENCOUNTER — Other Ambulatory Visit (INDEPENDENT_AMBULATORY_CARE_PROVIDER_SITE_OTHER): Payer: Self-pay | Admitting: Family Medicine

## 2020-12-13 DIAGNOSIS — F418 Other specified anxiety disorders: Secondary | ICD-10-CM

## 2020-12-13 NOTE — Telephone Encounter (Signed)
Pt last seen by Dr. Brown.  

## 2020-12-14 ENCOUNTER — Other Ambulatory Visit: Payer: Self-pay

## 2020-12-14 ENCOUNTER — Ambulatory Visit (INDEPENDENT_AMBULATORY_CARE_PROVIDER_SITE_OTHER): Payer: 59 | Admitting: Family Medicine

## 2020-12-14 ENCOUNTER — Encounter (INDEPENDENT_AMBULATORY_CARE_PROVIDER_SITE_OTHER): Payer: Self-pay | Admitting: Family Medicine

## 2020-12-14 VITALS — BP 124/78 | HR 58 | Temp 98.4°F | Ht 73.0 in | Wt 289.0 lb

## 2020-12-14 DIAGNOSIS — E559 Vitamin D deficiency, unspecified: Secondary | ICD-10-CM | POA: Diagnosis not present

## 2020-12-14 DIAGNOSIS — E038 Other specified hypothyroidism: Secondary | ICD-10-CM

## 2020-12-14 DIAGNOSIS — F418 Other specified anxiety disorders: Secondary | ICD-10-CM | POA: Diagnosis not present

## 2020-12-14 DIAGNOSIS — Z9189 Other specified personal risk factors, not elsewhere classified: Secondary | ICD-10-CM | POA: Diagnosis not present

## 2020-12-14 DIAGNOSIS — Z6841 Body Mass Index (BMI) 40.0 and over, adult: Secondary | ICD-10-CM

## 2020-12-14 DIAGNOSIS — E782 Mixed hyperlipidemia: Secondary | ICD-10-CM | POA: Diagnosis not present

## 2020-12-14 DIAGNOSIS — E8881 Metabolic syndrome: Secondary | ICD-10-CM

## 2020-12-14 MED ORDER — SERTRALINE HCL 50 MG PO TABS: 75.0000 mg | ORAL_TABLET | Freq: Every day | ORAL | 0 refills | Status: DC

## 2020-12-14 NOTE — Progress Notes (Signed)
Chief Complaint:   OBESITY Sherry Schmidt is here to discuss her progress with her obesity treatment plan along with follow-up of her obesity related diagnoses. Sherry Schmidt is on keeping a food journal and adhering to recommended goals of 1900 calories and 119 grams protein and states she is following her eating plan approximately 90% of the time. Sherry Schmidt states she is going to the gym 60-90 minutes 5 times per week.  Today's visit was #: 6 Starting weight: 312 lbs Starting date: 11/10/2019 Today's weight: 289 lbs Today's date: 12/14/2020 Total lbs lost to date: 23 Total lbs lost since last in-office visit: 6  Interim History: Sherry Schmidt's only issue with journaling is getting all protein in during the day. She is staying within calories. Denies hunger. Pt is recognizing some ratios between calories and protein. She has no upcoming trips but is celebrating her aunts birthday this weekend.  Subjective:   1. Vitamin D deficiency Pt is on OTC Vit D 1,000 IU daily. She denies nausea, vomiting, and muscle weakness but notes fatigue.   2. Insulin resistance Sherry Schmidt is not on Metformin anymore. She denies GI side effects previously.  3. Mixed hyperlipidemia She is not on statin therapy. Her last LDL was 125, HDL 47, and triglycerides 263.  4. Depression with anxiety Pt is on Zoloft with good control of symptoms.  5. Other specified hypothyroidism Sherry Schmidt is on Synthroid 100 mcg. Her last TSH was within normal limits.  6. At risk of diabetes mellitus Sherry Schmidt is at higher than average risk for developing diabetes due to obesity.   Assessment/Plan:   1. Vitamin D deficiency Low Vitamin D level contributes to fatigue and are associated with obesity, breast, and colon cancer. She agrees to continue to take OTC Vitamin D 1,000 IU every week and will follow-up for routine testing of Vitamin D, at least 2-3 times per year to avoid over-replacement. Check labs today.  - VITAMIN D 25  Hydroxy (Vit-D Deficiency, Fractures)  2. Insulin resistance [place] will continue to work on weight loss, exercise, and decreasing simple carbohydrates to help decrease the risk of diabetes. Sherry Schmidt agreed to follow-up with [place] as directed to closely monitor her progress. Check labs today.  - Comprehensive metabolic panel - Hemoglobin A1c - Insulin, random  3. Mixed hyperlipidemia Cardiovascular risk and specific lipid/LDL goals reviewed.  We discussed several lifestyle modifications today and Sherry Schmidt will continue to work on diet, exercise and weight loss efforts. Orders and follow up as documented in patient record.   Counseling Intensive lifestyle modifications are the first line treatment for this issue. Dietary changes: Increase soluble fiber. Decrease simple carbohydrates. Exercise changes: Moderate to vigorous-intensity aerobic activity 150 minutes per week if tolerated. Lipid-lowering medications: see documented in medical record. Check labs today.  - Lipid Panel With LDL/HDL Ratio  4. Depression with anxiety Behavior modification techniques were discussed today to help Sherry Schmidt deal with her emotional/non-hunger eating behaviors.  Orders and follow up as documented in patient record.   Refill- sertraline (ZOLOFT) 50 MG tablet; Take 1.5 tablets (75 mg total) by mouth daily. Take one tablet by mouth daily  Dispense: 45 tablet; Refill: 0  5. Other specified hypothyroidism Patient with long-standing hypothyroidism, on levothyroxine therapy. She appears euthyroid. Orders and follow up as documented in patient record. TSH today.  Counseling Good thyroid control is important for overall health. Supratherapeutic thyroid levels are dangerous and will not improve weight loss results. The correct way to take levothyroxine is fasting, with water, separated by  at least 30 minutes from breakfast, and separated by more than 4 hours from calcium, iron, multivitamins, acid reflux  medications (PPIs).   6. At risk of diabetes mellitus Sherry Schmidt was given approximately 15 minutes of diabetes education and counseling today. We discussed intensive lifestyle modifications today with an emphasis on weight loss as well as increasing exercise and decreasing simple carbohydrates in her diet. We also reviewed medication options with an emphasis on risk versus benefit of those discussed.   Repetitive spaced learning was employed today to elicit superior memory formation and behavioral change.  7. Obesity with current BMI of 38.2  Sherry Schmidt is currently in the action stage of change. As such, her goal is to continue with weight loss efforts. She has agreed to keeping a food journal and adhering to recommended goals of 1900 calories and 120+ grams protein.   Exercise goals:  As is  Behavioral modification strategies: increasing lean protein intake, meal planning and cooking strategies, keeping healthy foods in the home, and keeping a strict food journal.  Sherry Schmidt has agreed to follow-up with our clinic in 4 weeks. She was informed of the importance of frequent follow-up visits to maximize her success with intensive lifestyle modifications for her multiple health conditions.   Sherry Schmidt was informed we would discuss her lab results at her next visit unless there is a critical issue that needs to be addressed sooner. Sherry Schmidt agreed to keep her next visit at the agreed upon time to discuss these results.  Objective:   Blood pressure 124/78, pulse (!) 58, temperature 98.4 F (36.9 C), height '6\' 1"'$  (1.854 m), weight 289 lb (131.1 kg), last menstrual period 11/15/2020, SpO2 100 %. Body mass index is 38.13 kg/m.  General: Cooperative, alert, well developed, in no acute distress. HEENT: Conjunctivae and lids unremarkable. Cardiovascular: Regular rhythm.  Lungs: Normal work of breathing. Neurologic: No focal deficits.   Lab Results  Component Value Date   CREATININE 0.69  03/06/2020   BUN 13 03/06/2020   NA 140 03/06/2020   K 4.1 03/06/2020   CL 104 03/06/2020   CO2 20 03/06/2020   Lab Results  Component Value Date   ALT 26 03/06/2020   AST 20 03/06/2020   ALKPHOS 110 03/06/2020   BILITOT <0.2 03/06/2020   Lab Results  Component Value Date   HGBA1C 5.4 03/06/2020   HGBA1C 5.6 11/10/2019   Lab Results  Component Value Date   INSULIN 80.9 (H) 03/06/2020   INSULIN 29.3 (H) 11/10/2019   Lab Results  Component Value Date   TSH 4.070 03/06/2020   Lab Results  Component Value Date   CHOL 218 (H) 11/10/2019   HDL 47 11/10/2019   LDLCALC 125 (H) 11/10/2019   TRIG 263 (H) 11/10/2019   Lab Results  Component Value Date   VD25OH 17.5 (L) 03/06/2020   VD25OH 18.8 (L) 11/10/2019   Lab Results  Component Value Date   WBC 12.8 (H) 11/10/2019   HGB 15.5 11/10/2019   HCT 47.8 (H) 11/10/2019   MCV 90 11/10/2019   PLT 300 11/10/2019    Attestation Statements:   Reviewed by clinician on day of visit: allergies, medications, problem list, medical history, surgical history, family history, social history, and previous encounter notes.  Coral Ceo, CMA, am acting as transcriptionist for Coralie Common, MD.   I have reviewed the above documentation for accuracy and completeness, and I agree with the above. - Coralie Common, MD

## 2020-12-15 LAB — COMPREHENSIVE METABOLIC PANEL
ALT: 18 IU/L (ref 0–32)
AST: 17 IU/L (ref 0–40)
Albumin/Globulin Ratio: 1.6 (ref 1.2–2.2)
Albumin: 4.5 g/dL (ref 3.9–5.0)
Alkaline Phosphatase: 82 IU/L (ref 44–121)
BUN/Creatinine Ratio: 12 (ref 9–23)
BUN: 9 mg/dL (ref 6–20)
Bilirubin Total: 0.5 mg/dL (ref 0.0–1.2)
CO2: 18 mmol/L — ABNORMAL LOW (ref 20–29)
Calcium: 9.5 mg/dL (ref 8.7–10.2)
Chloride: 103 mmol/L (ref 96–106)
Creatinine, Ser: 0.77 mg/dL (ref 0.57–1.00)
Globulin, Total: 2.8 g/dL (ref 1.5–4.5)
Glucose: 75 mg/dL (ref 65–99)
Potassium: 3.9 mmol/L (ref 3.5–5.2)
Sodium: 140 mmol/L (ref 134–144)
Total Protein: 7.3 g/dL (ref 6.0–8.5)
eGFR: 110 mL/min/{1.73_m2} (ref >59)

## 2020-12-15 LAB — HEMOGLOBIN A1C
Est. average glucose Bld gHb Est-mCnc: 105 mg/dL
Hgb A1c MFr Bld: 5.3 % (ref 4.8–5.6)

## 2020-12-15 LAB — LIPID PANEL WITH LDL/HDL RATIO
Cholesterol, Total: 187 mg/dL (ref 100–199)
HDL: 46 mg/dL (ref >39)
LDL Chol Calc (NIH): 117 mg/dL — ABNORMAL HIGH (ref 0–99)
LDL/HDL Ratio: 2.5 ratio (ref 0.0–3.2)
Triglycerides: 135 mg/dL (ref 0–149)
VLDL Cholesterol Cal: 24 mg/dL (ref 5–40)

## 2020-12-15 LAB — INSULIN, RANDOM: INSULIN: 15.8 u[IU]/mL (ref 2.6–24.9)

## 2020-12-15 LAB — TSH: TSH: 2.49 u[IU]/mL (ref 0.450–4.500)

## 2020-12-15 LAB — VITAMIN D 25 HYDROXY (VIT D DEFICIENCY, FRACTURES): Vit D, 25-Hydroxy: 40.3 ng/mL (ref 30.0–100.0)

## 2021-01-10 ENCOUNTER — Other Ambulatory Visit (INDEPENDENT_AMBULATORY_CARE_PROVIDER_SITE_OTHER): Payer: Self-pay | Admitting: Physician Assistant

## 2021-01-10 DIAGNOSIS — E038 Other specified hypothyroidism: Secondary | ICD-10-CM

## 2021-01-10 MED ORDER — LEVOTHYROXINE SODIUM 100 MCG PO TABS
100.0000 ug | ORAL_TABLET | Freq: Every day | ORAL | 0 refills | Status: DC
Start: 2021-01-10 — End: 2021-02-15

## 2021-01-10 NOTE — Telephone Encounter (Signed)
Dr.Ukleja 

## 2021-01-10 NOTE — Telephone Encounter (Signed)
LAST APPOINTMENT DATE: 12/14/20 NEXT APPOINTMENT DATE: 01/15/21   [place], [place] [address] [place] [place] [address] Phone: [phone] Fax: [phone]  Patient is requesting a refill of the following medications: Pending Prescriptions:                       Disp   Refills   levothyroxine (SYNTHROID) 100 MCG tablet   90 tab*0       Sig: Take 1 tablet (100 mcg total) by mouth daily before          breakfast.   Date last filled: 10/04/20 for #90 Previously prescribed by [facility]   Lab Results      Component                Value               Date                      HGBA1C                   5.3                 12/14/2020                HGBA1C                   5.4                 03/06/2020                HGBA1C                   5.6                 11/10/2019           Lab Results      Component                Value               Date                      LDLCALC                  117 (H)             12/14/2020                CREATININE               0.77                12/14/2020           Lab Results      Component                Value               Date                      VD25OH                   40.3                12/14/2020                VD25OH  17.5 (L)            03/06/2020                VD25OH                   18.8 (L)            11/10/2019            BP Readings from Last 3 Encounters: 12/14/20 : 124/78 11/20/20 : 119/79 09/09/20 : 117/81

## 2021-01-11 ENCOUNTER — Other Ambulatory Visit (INDEPENDENT_AMBULATORY_CARE_PROVIDER_SITE_OTHER): Payer: Self-pay | Admitting: Family Medicine

## 2021-01-11 DIAGNOSIS — F418 Other specified anxiety disorders: Secondary | ICD-10-CM

## 2021-01-11 MED ORDER — SERTRALINE HCL 50 MG PO TABS: 75.0000 mg | ORAL_TABLET | Freq: Every day | ORAL | 0 refills | Status: DC

## 2021-01-11 NOTE — Telephone Encounter (Signed)
LAST APPOINTMENT DATE: 12/14/20 NEXT APPOINTMENT DATE: 01/15/21   [place], [place] [address] [place] [place] [address] Phone: [phone] Fax: [phone]  Patient is requesting a refill of the following medications: Pending Prescriptions:                       Disp   Refills   sertraline (ZOLOFT) 50 MG tablet           45 tab*0       Sig: Take 1.5 tablets (75 mg total) by mouth daily. Take          one tablet by mouth daily   Date last filled: 12/14/20 Previously prescribed by Dr Jearld Shines  Lab Results      Component                Value               Date                      HGBA1C                   5.3                 12/14/2020                HGBA1C                   5.4                 03/06/2020                HGBA1C                   5.6                 11/10/2019           Lab Results      Component                Value               Date                      LDLCALC                  117 (H)             12/14/2020                CREATININE               0.77                12/14/2020           Lab Results      Component                Value               Date                      VD25OH                   40.3                12/14/2020  VD25OH                   17.5 (L)            03/06/2020                VD25OH                   18.8 (L)            11/10/2019            BP Readings from Last 3 Encounters: 12/14/20 : 124/78 11/20/20 : 119/79 09/09/20 : 117/81

## 2021-01-15 ENCOUNTER — Ambulatory Visit (INDEPENDENT_AMBULATORY_CARE_PROVIDER_SITE_OTHER): Payer: 59 | Admitting: Family Medicine

## 2021-01-15 ENCOUNTER — Encounter (INDEPENDENT_AMBULATORY_CARE_PROVIDER_SITE_OTHER): Payer: Self-pay

## 2021-02-15 ENCOUNTER — Other Ambulatory Visit (INDEPENDENT_AMBULATORY_CARE_PROVIDER_SITE_OTHER): Payer: Self-pay | Admitting: Family Medicine

## 2021-02-15 DIAGNOSIS — E038 Other specified hypothyroidism: Secondary | ICD-10-CM

## 2021-02-15 DIAGNOSIS — F418 Other specified anxiety disorders: Secondary | ICD-10-CM

## 2021-02-16 ENCOUNTER — Other Ambulatory Visit (INDEPENDENT_AMBULATORY_CARE_PROVIDER_SITE_OTHER): Payer: Self-pay | Admitting: Family Medicine

## 2021-02-16 DIAGNOSIS — F418 Other specified anxiety disorders: Secondary | ICD-10-CM

## 2021-02-19 ENCOUNTER — Encounter (INDEPENDENT_AMBULATORY_CARE_PROVIDER_SITE_OTHER): Payer: Self-pay

## 2021-02-19 NOTE — Telephone Encounter (Signed)
Message sent to pt-CAS 

## 2021-02-19 NOTE — Telephone Encounter (Signed)
Review.

## 2021-02-19 NOTE — Telephone Encounter (Signed)
Message sent to pt.

## 2021-02-19 NOTE — Telephone Encounter (Signed)
LAST APPOINTMENT DATE: 12/14/20 NEXT APPOINTMENT DATE: 03/13/21   [place], [place] [address] [place] [place] [address] Phone: [phone] Fax: [phone]  Patient is requesting a refill of the following medications: Pending Prescriptions:                       Disp   Refills   levothyroxine (SYNTHROID) 100 MCG tablet   90 tab*0       Sig: Take 1 tablet (100 mcg total) by mouth daily before          breakfast.   sertraline (ZOLOFT) 50 MG tablet           45 tab*0       Sig: Take 1.5 tablets (75 mg total) by mouth daily. Take          one tablet by mouth daily   Date last filled: Sertraline #45 filled 01/11/21 Levothryoxine #90 last filled 01/10/21  Previously prescribed by dr Leafy Ro   Lab Results      Component                Value               Date                      HGBA1C                   5.3                 12/14/2020                HGBA1C                   5.4                 03/06/2020                HGBA1C                   5.6                 11/10/2019           Lab Results      Component                Value               Date                      LDLCALC                  117 (H)             12/14/2020                CREATININE               0.77                12/14/2020           Lab Results      Component                Value               Date                      VD25OH  40.3                12/14/2020                VD25OH                   17.5 (L)            03/06/2020                VD25OH                   18.8 (L)            11/10/2019            BP Readings from Last 3 Encounters: 12/14/20 : 124/78 11/20/20 : 119/79 09/09/20 : 117/81

## 2021-02-20 MED ORDER — LEVOTHYROXINE SODIUM 100 MCG PO TABS: 100.0000 ug | ORAL_TABLET | Freq: Every day | ORAL | 0 refills | Status: DC

## 2021-02-20 MED ORDER — SERTRALINE HCL 50 MG PO TABS: 75.0000 mg | ORAL_TABLET | Freq: Every day | ORAL | 0 refills | Status: DC

## 2021-03-13 ENCOUNTER — Ambulatory Visit (INDEPENDENT_AMBULATORY_CARE_PROVIDER_SITE_OTHER): Payer: Self-pay | Admitting: Family Medicine

## 2021-03-13 ENCOUNTER — Encounter (INDEPENDENT_AMBULATORY_CARE_PROVIDER_SITE_OTHER): Payer: Self-pay

## 2021-03-29 ENCOUNTER — Encounter (HOSPITAL_COMMUNITY): Payer: Self-pay

## 2021-03-29 ENCOUNTER — Other Ambulatory Visit (INDEPENDENT_AMBULATORY_CARE_PROVIDER_SITE_OTHER): Payer: Self-pay | Admitting: Family Medicine

## 2021-03-29 ENCOUNTER — Other Ambulatory Visit: Payer: Self-pay

## 2021-03-29 ENCOUNTER — Ambulatory Visit (HOSPITAL_COMMUNITY)
Admission: EM | Admit: 2021-03-29 | Discharge: 2021-03-29 | Disposition: A | Discharge status: Home / Self Care | Payer: Medicaid Other | Attending: Student | Admitting: Student

## 2021-03-29 DIAGNOSIS — J4521 Mild intermittent asthma with (acute) exacerbation: Secondary | ICD-10-CM

## 2021-03-29 DIAGNOSIS — F418 Other specified anxiety disorders: Secondary | ICD-10-CM

## 2021-03-29 DIAGNOSIS — J0100 Acute maxillary sinusitis, unspecified: Secondary | ICD-10-CM

## 2021-03-29 MED ORDER — SERTRALINE HCL 50 MG PO TABS: 75.0000 mg | ORAL_TABLET | Freq: Every day | ORAL | 0 refills | Status: DC

## 2021-03-29 MED ORDER — PREDNISONE 20 MG PO TABS: 40.0000 mg | ORAL_TABLET | Freq: Every day | ORAL | 0 refills | Status: AC

## 2021-03-29 MED ORDER — AMOXICILLIN 875 MG PO TABS: 875.0000 mg | ORAL_TABLET | Freq: Two times a day (BID) | ORAL | 0 refills | Status: AC

## 2021-03-29 MED ORDER — FLUCONAZOLE 150 MG PO TABS: 150.0000 mg | ORAL_TABLET | Freq: Every day | ORAL | 0 refills | Status: DC

## 2021-03-29 NOTE — Discharge Instructions (Addendum)
-  Amoxicillin twice daily x7 days. You can take with food.  -Diflucan sent for yeast infection -Prednisone, 2 pills taken at the same time for 5 days in a row.  Try taking this earlier in the day as it can give you energy. Limit NSAIDs like ibuprofen and alleve while taking this medication as they can increase your risk of stomach upset and even GI bleeding when in combination with a steroid. You can continue tylenol (acetaminophen) up to 1000mg  3x daily. -Follow-up if symptoms worsen/persist.

## 2021-03-29 NOTE — ED Triage Notes (Signed)
Pt presents to the office today for cough congestion x 2 weeks.She has taken OTC medication with no relief.

## 2021-03-29 NOTE — ED Provider Notes (Signed)
[place]    CSN: [id number] Arrival date & time: 03/29/21  0805      History   Chief Complaint Chief Complaint  Patient presents with   Cough    Congestion     HPI Sherry Schmidt is a 25 y.o. female presenting with viral syndrome x2 weeks and worsening purulent nasal congestion. Medical history asthma.  Describes 2 weeks of symptoms.  Symptoms initially did seem to improve after about 1 week, and then have progressively worsened since then.  Describes cough productive of yellow and thick green sputum, purulent nasal congestion that is thick, green and yellow and sometimes bloody, pain radiating to her left ear.  Denies shortness of breath, chest pain, dizziness, fever/chills.  She has an albuterol inhaler and nebulizer at home, this is providing temporary relief.  Denies known sick contacts.  States she cannot be pregnant.  HPI  Past Medical History:  Diagnosis Date   Asthma    Back pain    Back pain    Depression    Panic attacks    Thyroid disease     Patient Active Problem List   Diagnosis Date Noted   Hypothyroidism 05/12/2020   Transaminitis 03/06/2020   Abnormal thyroid blood test 03/06/2020   Mixed hyperlipidemia 03/06/2020   Vitamin D deficiency 02/18/2020   Insulin resistance 02/18/2020   Depression with anxiety 02/18/2020   Class 3 severe obesity with serious comorbidity and body mass index (BMI) of 40.0 to 44.9 in adult [facility]) 02/18/2020   Fatigue 11/06/2016   Postprocedural hypothyroidism 10/16/2016    Past Surgical History:  Procedure Laterality Date   THYROIDECTOMY      OB History     Gravida  0   Para  0   Term  0   Preterm  0   AB  0   Living  0      SAB  0   IAB  0   Ectopic  0   Multiple  0   Live Births  0            Home Medications    Prior to Admission medications   Medication Sig Start Date End Date Taking? Authorizing Provider  amoxicillin (AMOXIL) 875 MG tablet Take 1 tablet (875 mg  total) by mouth 2 (two) times daily for 7 days. 03/29/21 04/05/21 Yes Hazel Sams, PA-C  fluconazole (DIFLUCAN) 150 MG tablet Take 1 tablet (150 mg total) by mouth daily. Take one pill at onset of symptoms. Take second pill in 3 days if symptoms persist. 03/29/21  Yes Hazel Sams, PA-C  predniSONE (DELTASONE) 20 MG tablet Take 2 tablets (40 mg total) by mouth daily for 5 days. Take with breakfast or lunch. Avoid NSAIDs (ibuprofen, etc) while taking this medication. 03/29/21 04/03/21 Yes Hazel Sams, PA-C  albuterol (PROVENTIL HFA;VENTOLIN HFA) 108 (90 Base) MCG/ACT inhaler Inhale 2 puffs into the lungs every 6 (six) hours as needed for wheezing or shortness of breath.    [provider]  ibuprofen (ADVIL) 800 MG tablet Take 1 tablet (800 mg total) by mouth 3 (three) times daily. 09/09/20   Eliezer Lofts, FNP  levothyroxine (SYNTHROID) 100 MCG tablet Take 1 tablet (100 mcg total) by mouth daily before breakfast. 02/20/21   Laqueta Linden, MD  metFORMIN (GLUCOPHAGE) 500 MG tablet 1/2 tab with breakfast for one week then increase to 1 full tab with breakfast Patient not taking: Reported on 12/14/2020 08/14/20   Abby Potash, PA-C  sertraline (ZOLOFT) 50 MG tablet Take 1.5 tablets (75 mg total) by mouth daily. Take one tablet by mouth daily 02/20/21   Laqueta Linden, MD  Vitamin D, Ergocalciferol, (DRISDOL) 1.25 MG (50000 UNIT) CAPS capsule Take 1 capsule (50,000 Units total) by mouth every 3 (three) days. Patient not taking: Reported on 12/14/2020 08/14/20   Abby Potash, PA-C    Family History Family History  Problem Relation Age of Onset   Hypertension Mother    Depression Mother    Bipolar disorder Mother    Drug abuse Mother    Obesity Mother    Diabetes Father    Thyroid disease Father    Cancer Father    Depression Father    Anxiety disorder Father    Bipolar disorder Father    Schizophrenia Father    Liver disease Father    Sleep apnea Father     Alcohol abuse Father    Drug abuse Father    Obesity Father     Social History Social History   Tobacco Use   Smoking status: Never   Smokeless tobacco: Never  Vaping Use   Vaping Use: Never used  Substance Use Topics   Alcohol use: Yes    Comment: occasional   Drug use: No     Allergies   Patient has no known allergies.   Review of Systems Review of Systems  Constitutional:  Negative for appetite change, chills and fever.  HENT:  Positive for congestion. Negative for ear pain, rhinorrhea, sinus pressure, sinus pain and sore throat.   Eyes:  Negative for redness and visual disturbance.  Respiratory:  Positive for cough. Negative for chest tightness, shortness of breath and wheezing.   Cardiovascular:  Negative for chest pain and palpitations.  Gastrointestinal:  Negative for abdominal pain, constipation, diarrhea, nausea and vomiting.  Genitourinary:  Negative for dysuria, frequency and urgency.  Musculoskeletal:  Negative for myalgias.  Neurological:  Negative for dizziness, weakness and headaches.  Psychiatric/Behavioral:  Negative for confusion.   All other systems reviewed and are negative.   Physical Exam Triage Vital Signs ED Triage Vitals  Enc Vitals Group     BP      Pulse      Resp      Temp      Temp src      SpO2      Weight      Height      Head Circumference      Peak Flow      Pain Score      Pain Loc      Pain Edu?      Excl. in [place]?    No data found.  Updated Vital Signs BP (!) 152/89 (BP Location: Left Arm)    Pulse 74    Temp 99 F (37.2 C) (Oral)    Resp 16    SpO2 100%   Visual Acuity Right Eye Distance:   Left Eye Distance:   Bilateral Distance:    Right Eye Near:   Left Eye Near:    Bilateral Near:     Physical Exam Vitals reviewed.  Constitutional:      General: She is not in acute distress.    Appearance: Normal appearance. She is not ill-appearing.  HENT:     Head: Normocephalic and atraumatic.     Right Ear:  Tympanic membrane, ear canal and external ear normal. No tenderness. No middle ear effusion. There is no impacted cerumen. Tympanic  membrane is not perforated, erythematous, retracted or bulging.     Left Ear: Tympanic membrane, ear canal and external ear normal. No tenderness.  No middle ear effusion. There is no impacted cerumen. Tympanic membrane is not perforated, erythematous, retracted or bulging.     Nose: No congestion.     Right Sinus: Maxillary sinus tenderness present. No frontal sinus tenderness.     Left Sinus: Maxillary sinus tenderness present. No frontal sinus tenderness.     Mouth/Throat:     Mouth: Mucous membranes are moist.     Pharynx: Uvula midline. No oropharyngeal exudate or posterior oropharyngeal erythema.  Eyes:     Extraocular Movements: Extraocular movements intact.     Pupils: Pupils are equal, round, and reactive to light.  Cardiovascular:     Rate and Rhythm: Normal rate and regular rhythm.     Heart sounds: Normal heart sounds.  Pulmonary:     Effort: Pulmonary effort is normal.     Breath sounds: Normal breath sounds. No decreased breath sounds, wheezing, rhonchi or rales.  Abdominal:     Palpations: Abdomen is soft.     Tenderness: There is no abdominal tenderness. There is no guarding or rebound.  Lymphadenopathy:     Cervical: No cervical adenopathy.     Right cervical: No superficial cervical adenopathy.    Left cervical: No superficial cervical adenopathy.  Neurological:     General: No focal deficit present.     Mental Status: She is alert and oriented to person, place, and time.  Psychiatric:        Mood and Affect: Mood normal.        Behavior: Behavior normal.        Thought Content: Thought content normal.        Judgment: Judgment normal.     UC Treatments / Results  Labs (all labs ordered are listed, but only abnormal results are displayed) Labs Reviewed - No data to display  EKG   Radiology No results  found.  Procedures Procedures (including critical care time)  Medications Ordered in UC Medications - No data to display  Initial Impression / Assessment and Plan / UC Course  I have reviewed the triage vital signs and the nursing notes.  Pertinent labs & imaging results that were available during my care of the patient were reviewed by me and considered in my medical decision making (see chart for details).     This patient is a very pleasant 25 y.o. year old female presenting with maxillary sinusitis following secondary sickening phenomenon/ viral URI. Also with asthma exacerbation. Today this pt is afebrile nontachycardic nontachypneic, oxygenating well on room air, no wheezes rhonchi or rales. History asthma -continue albuterol inhaler and nebulizer, she declines refills of this today. States she is not pregnant or breastfeeding.  COVID and influenza PCR deferred given duration of symptoms (2 weeks). For sinusitis, will manage with amoxicillin as below.  Also sent low-dose prednisone for asthma exacerbation, continue inhalers.  Diflucan sent for yeast prophylaxis, she does not have any current vaginal or urinary symptoms.  ED return precautions discussed. Patient verbalizes understanding and agreement.   Level 4 for acute exacerbation of chronic condition and prescription drug management.  Final Clinical Impressions(s) / UC Diagnoses   Final diagnoses:  Acute non-recurrent maxillary sinusitis  Mild intermittent asthma with acute exacerbation     Discharge Instructions      -Amoxicillin twice daily x7 days. You can take with food.  -Diflucan sent for  yeast infection -Prednisone, 2 pills taken at the same time for 5 days in a row.  Try taking this earlier in the day as it can give you energy. Limit NSAIDs like ibuprofen and alleve while taking this medication as they can increase your risk of stomach upset and even GI bleeding when in combination with a steroid. You can  continue tylenol (acetaminophen) up to 1000mg  3x daily. -Follow-up if symptoms worsen/persist.     ED Prescriptions     Medication Sig Dispense Auth. Provider   predniSONE (DELTASONE) 20 MG tablet Take 2 tablets (40 mg total) by mouth daily for 5 days. Take with breakfast or lunch. Avoid NSAIDs (ibuprofen, etc) while taking this medication. 10 tablet Hazel Sams, PA-C   amoxicillin (AMOXIL) 875 MG tablet Take 1 tablet (875 mg total) by mouth 2 (two) times daily for 7 days. 14 tablet Hazel Sams, PA-C   fluconazole (DIFLUCAN) 150 MG tablet Take 1 tablet (150 mg total) by mouth daily. Take one pill at onset of symptoms. Take second pill in 3 days if symptoms persist. 2 tablet Hazel Sams, PA-C      PDMP not reviewed this encounter.   Hazel Sams, PA-C 03/29/21 [id number]

## 2021-04-04 ENCOUNTER — Encounter (INDEPENDENT_AMBULATORY_CARE_PROVIDER_SITE_OTHER): Payer: Self-pay

## 2021-04-04 ENCOUNTER — Ambulatory Visit (INDEPENDENT_AMBULATORY_CARE_PROVIDER_SITE_OTHER): Payer: 59 | Admitting: Family Medicine

## 2021-05-07 ENCOUNTER — Other Ambulatory Visit (INDEPENDENT_AMBULATORY_CARE_PROVIDER_SITE_OTHER): Payer: Self-pay | Admitting: Family Medicine

## 2021-05-07 DIAGNOSIS — E038 Other specified hypothyroidism: Secondary | ICD-10-CM

## 2021-05-07 DIAGNOSIS — F418 Other specified anxiety disorders: Secondary | ICD-10-CM

## 2021-05-10 ENCOUNTER — Other Ambulatory Visit: Payer: Self-pay

## 2021-05-10 ENCOUNTER — Encounter (INDEPENDENT_AMBULATORY_CARE_PROVIDER_SITE_OTHER): Payer: Self-pay | Admitting: Family Medicine

## 2021-05-10 ENCOUNTER — Ambulatory Visit (INDEPENDENT_AMBULATORY_CARE_PROVIDER_SITE_OTHER): Payer: 59 | Admitting: Family Medicine

## 2021-05-10 VITALS — BP 119/73 | HR 66 | Temp 98.4°F | Ht 73.0 in | Wt 270.0 lb

## 2021-05-10 DIAGNOSIS — Z9189 Other specified personal risk factors, not elsewhere classified: Secondary | ICD-10-CM

## 2021-05-10 DIAGNOSIS — E669 Obesity, unspecified: Secondary | ICD-10-CM | POA: Diagnosis not present

## 2021-05-10 DIAGNOSIS — Z6835 Body mass index (BMI) 35.0-35.9, adult: Secondary | ICD-10-CM | POA: Diagnosis not present

## 2021-05-10 DIAGNOSIS — F418 Other specified anxiety disorders: Secondary | ICD-10-CM | POA: Diagnosis not present

## 2021-05-10 DIAGNOSIS — E038 Other specified hypothyroidism: Secondary | ICD-10-CM | POA: Diagnosis not present

## 2021-05-10 MED ORDER — SERTRALINE HCL 50 MG PO TABS: 75.0000 mg | ORAL_TABLET | Freq: Every day | ORAL | 0 refills | Status: DC

## 2021-05-10 MED ORDER — LEVOTHYROXINE SODIUM 100 MCG PO TABS: 100.0000 ug | ORAL_TABLET | Freq: Every day | ORAL | 0 refills | Status: DC

## 2021-05-14 NOTE — Progress Notes (Signed)
Chief Complaint:   OBESITY Sherry Schmidt is here to discuss her progress with her obesity treatment plan along with follow-up of her obesity related diagnoses. Sherry Schmidt is on keeping a food journal and adhering to recommended goals of 1900 calories and 120+ grams protein and states she is following her eating plan approximately 90% of the time. Sherry Schmidt states she is going to the gym 60 minutes 5 times per week.  Today's visit was #: 14 Starting weight: 312 lbs Starting date: 11/10/2019 Today's weight: 270 lbs Today's date: 05/10/2021 Total lbs lost to date: 42 Total lbs lost since last in-office visit: 19  Interim History: Pt is logging food daily and is around 1900-2000 calories per day. Protein wise, she is not sure where she is ending. Pt is doing quite a bit a of eggs, chicken, and rice for meals. Snacks are nuts, granola bars. She is going to start a new job in a week and a half.  Subjective:   1. Depression with anxiety Sherry Schmidt denies suicidal or homicidal ideations. She is doing well on Zoloft 75 mg.  2. Other specified hypothyroidism Sherry Schmidt denies hot or cold intolerances or palpitations. She is on levothyroxine and her last TSH level was 2.49.  3. At risk for malnutrition Sherry Schmidt is at increased risk for malnutrition due to not measuring proteins.  Assessment/Plan:   1. Depression with anxiety Behavior modification techniques were discussed today to help Sherry Schmidt deal with her emotional/non-hunger eating behaviors.  Orders and follow up as documented in patient record.   Refill- sertraline (ZOLOFT) 50 MG tablet; Take 1.5 tablets (75 mg total) by mouth daily. Take one tablet by mouth daily  Dispense: 135 tablet; Refill: 0  2. Other specified hypothyroidism Patient with long-standing hypothyroidism, on levothyroxine therapy. She appears euthyroid. Orders and follow up as documented in patient record.  Counseling Good thyroid control is important for overall  health. Supratherapeutic thyroid levels are dangerous and will not improve weight loss results. The correct way to take levothyroxine is fasting, with water, separated by at least 30 minutes from breakfast, and separated by more than 4 hours from calcium, iron, multivitamins, acid reflux medications (PPIs).   Refill- levothyroxine (SYNTHROID) 100 MCG tablet; Take 1 tablet (100 mcg total) by mouth daily before breakfast.  Dispense: 90 tablet; Refill: 0  3. At risk for malnutrition Sherry Schmidt was given approximately 15 minutes of counseling today regarding prevention of malnutrition and ways to meet macronutrient goals..   4. Obesity with current BMI of 35.6 Sherry Schmidt is currently in the action stage of change. As such, her goal is to continue with weight loss efforts. She has agreed to keeping a food journal and adhering to recommended goals of 1900 calories and 120+ grams protein.   Exercise goals:  As is  Behavioral modification strategies: increasing lean protein intake, meal planning and cooking strategies, keeping healthy foods in the home, and planning for success.  Sherry Schmidt has agreed to follow-up with our clinic in 3-4 weeks. She was informed of the importance of frequent follow-up visits to maximize her success with intensive lifestyle modifications for her multiple health conditions.   Objective:   Blood pressure 119/73, pulse 66, temperature 98.4 F (36.9 C), height 6\' 1"  (1.854 m), weight 270 lb (122.5 kg), last menstrual period 04/09/2021, SpO2 99 %. Body mass index is 35.62 kg/m.  General: Cooperative, alert, well developed, in no acute distress. HEENT: Conjunctivae and lids unremarkable. Cardiovascular: Regular rhythm.  Lungs: Normal work of breathing. Neurologic: No  focal deficits.   Lab Results  Component Value Date   CREATININE 0.77 12/14/2020   BUN 9 12/14/2020   NA 140 12/14/2020   K 3.9 12/14/2020   CL 103 12/14/2020   CO2 18 (L) 12/14/2020   Lab Results   Component Value Date   ALT 18 12/14/2020   AST 17 12/14/2020   ALKPHOS 82 12/14/2020   BILITOT 0.5 12/14/2020   Lab Results  Component Value Date   HGBA1C 5.3 12/14/2020   HGBA1C 5.4 03/06/2020   HGBA1C 5.6 11/10/2019   Lab Results  Component Value Date   INSULIN 15.8 12/14/2020   INSULIN 80.9 (H) 03/06/2020   INSULIN 29.3 (H) 11/10/2019   Lab Results  Component Value Date   TSH 2.490 12/14/2020   Lab Results  Component Value Date   CHOL 187 12/14/2020   HDL 46 12/14/2020   LDLCALC 117 (H) 12/14/2020   TRIG 135 12/14/2020   Lab Results  Component Value Date   VD25OH 40.3 12/14/2020   VD25OH 17.5 (L) 03/06/2020   VD25OH 18.8 (L) 11/10/2019   Lab Results  Component Value Date   WBC 12.8 (H) 11/10/2019   HGB 15.5 11/10/2019   HCT 47.8 (H) 11/10/2019   MCV 90 11/10/2019   PLT 300 11/10/2019   Attestation Statements:   Reviewed by clinician on day of visit: allergies, medications, problem list, medical history, surgical history, family history, social history, and previous encounter notes.  Coral Ceo, CMA, am acting as transcriptionist for Coralie Common, MD.   I have reviewed the above documentation for accuracy and completeness, and I agree with the above. - Coralie Common, MD

## 2021-05-24 ENCOUNTER — Ambulatory Visit (INDEPENDENT_AMBULATORY_CARE_PROVIDER_SITE_OTHER): Payer: 59 | Admitting: Family Medicine

## 2021-06-06 ENCOUNTER — Encounter (HOSPITAL_COMMUNITY): Payer: Self-pay

## 2021-06-06 ENCOUNTER — Other Ambulatory Visit: Payer: Self-pay

## 2021-06-06 ENCOUNTER — Ambulatory Visit (HOSPITAL_COMMUNITY)
Admission: EM | Admit: 2021-06-06 | Discharge: 2021-06-06 | Disposition: A | Discharge status: Home / Self Care | Payer: Self-pay | Attending: Nurse Practitioner | Admitting: Nurse Practitioner

## 2021-06-06 DIAGNOSIS — J029 Acute pharyngitis, unspecified: Secondary | ICD-10-CM | POA: Insufficient documentation

## 2021-06-06 LAB — POCT RAPID STREP A, ED / UC: Streptococcus, Group A Screen (Direct): NEGATIVE

## 2021-06-06 MED ORDER — LIDOCAINE VISCOUS HCL 2 % MT SOLN: 5.0000 mL | Freq: Four times a day (QID) | OROMUCOSAL | 0 refills | Status: AC | PRN

## 2021-06-06 NOTE — ED Provider Notes (Signed)
?[place] ? ? ? ?CSN: [id number] ?Arrival date & time: 06/06/21  0806 ? ? ?  ? ?History   ?Chief Complaint ?Chief Complaint  ?Patient presents with  ? Sore Throat  ? ? ?HPI ?Sherry Schmidt is a 26 y.o. female.  ? ?Patient is a 26 year old female who presents with sore throat for 3 days.  She also complains of nasal congestion, runny nose, cough, headache.  Denies fever or chills.  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  She has received her COVID vaccines, but not the influenza vaccine.  She has not taken any medication for her symptoms. ? ? ?Sore Throat ?This is a new problem. The current episode started more than 2 days ago. The problem occurs constantly. The problem has been gradually worsening. Associated symptoms include headaches. She has tried nothing for the symptoms.  ? ?Past Medical History:  ?Diagnosis Date  ? Asthma   ? Back pain   ? Back pain   ? Depression   ? Panic attacks   ? Thyroid disease   ? ? ?Patient Active Problem List  ? Diagnosis Date Noted  ? Hypothyroidism 05/12/2020  ? Transaminitis 03/06/2020  ? Abnormal thyroid blood test 03/06/2020  ? Mixed hyperlipidemia 03/06/2020  ? Vitamin D deficiency 02/18/2020  ? Insulin resistance 02/18/2020  ? Depression with anxiety 02/18/2020  ? Class 3 severe obesity with serious comorbidity and body mass index (BMI) of 40.0 to 44.9 in adult [facility]) 02/18/2020  ? Fatigue 11/06/2016  ? Postprocedural hypothyroidism 10/16/2016  ? ? ?Past Surgical History:  ?Procedure Laterality Date  ? THYROIDECTOMY    ? ? ?OB History   ? ? Gravida  ?0  ? Para  ?0  ? Term  ?0  ? Preterm  ?0  ? AB  ?0  ? Living  ?0  ?  ? ? SAB  ?0  ? IAB  ?0  ? Ectopic  ?0  ? Multiple  ?0  ? Live Births  ?0  ?   ?  ?  ? ? ? ?Home Medications   ? ?Prior to Admission medications   ?Medication Sig Start Date End Date Taking? Authorizing Provider  ?albuterol (PROVENTIL HFA;VENTOLIN HFA) 108 (90 Base) MCG/ACT inhaler Inhale 2 puffs into  the lungs every 6 (six) hours as needed for wheezing or shortness of breath.    [provider]  ?fluconazole (DIFLUCAN) 150 MG tablet Take 1 tablet (150 mg total) by mouth daily. Take one pill at onset of symptoms. Take second pill in 3 days if symptoms persist. 03/29/21   Hazel Sams, PA-C  ?ibuprofen (ADVIL) 800 MG tablet Take 1 tablet (800 mg total) by mouth 3 (three) times daily. 09/09/20   Eliezer Lofts, FNP  ?levothyroxine (SYNTHROID) 100 MCG tablet Take 1 tablet (100 mcg total) by mouth daily before breakfast. 05/10/21   Laqueta Linden, MD  ?metFORMIN (GLUCOPHAGE) 500 MG tablet 1/2 tab with breakfast for one week then increase to 1 full tab with breakfast 08/14/20   Abby Potash, PA-C  ?sertraline (ZOLOFT) 50 MG tablet Take 1.5 tablets (75 mg total) by mouth daily. Take one tablet by mouth daily 05/10/21   Laqueta Linden, MD  ?Vitamin D, Ergocalciferol, (DRISDOL) 1.25 MG (50000 UNIT) CAPS capsule Take 1 capsule (50,000 Units total) by mouth every 3 (three) days. 08/14/20   Abby Potash, PA-C  ? ? ?Family History ?Family History  ?Problem Relation Age  of Onset  ? Hypertension Mother   ? Depression Mother   ? Bipolar disorder Mother   ? Drug abuse Mother   ? Obesity Mother   ? Diabetes Father   ? Thyroid disease Father   ? Cancer Father   ? Depression Father   ? Anxiety disorder Father   ? Bipolar disorder Father   ? Schizophrenia Father   ? Liver disease Father   ? Sleep apnea Father   ? Alcohol abuse Father   ? Drug abuse Father   ? Obesity Father   ? ? ?Social History ?Social History  ? ?Tobacco Use  ? Smoking status: Never  ? Smokeless tobacco: Never  ?Vaping Use  ? Vaping Use: Never used  ?Substance Use Topics  ? Alcohol use: Yes  ?  Comment: occasional  ? Drug use: No  ? ? ? ?Allergies   ?Patient has no known allergies. ? ? ?Review of Systems ?Review of Systems  ?Constitutional:  Positive for fatigue. Negative for appetite change and fever.  ?HENT:  Positive for congestion, sore  throat and trouble swallowing. Negative for sinus pressure and sinus pain.   ?Eyes: Negative.   ?Respiratory:  Positive for cough.   ?Cardiovascular: Negative.   ?Gastrointestinal: Negative.   ?Skin: Negative.   ?Neurological:  Positive for headaches.  ?Psychiatric/Behavioral: Negative.    ? ? ?Physical Exam ?Triage Vital Signs ?ED Triage Vitals  ?Enc Vitals Group  ?   BP 06/06/21 0829 123/79  ?   Pulse Rate 06/06/21 0829 66  ?   Resp 06/06/21 0829 20  ?   Temp 06/06/21 0829 98.8 ?F (37.1 ?C)  ?   Temp Source 06/06/21 0829 Oral  ?   SpO2 06/06/21 0829 99 %  ?   Weight --   ?   Height --   ?   Head Circumference --   ?   Peak Flow --   ?   Pain Score 06/06/21 0828 2  ?   Pain Loc --   ?   Pain Edu? --   ?   Excl. in [place]? --   ? ?No data found. ? ?Updated Vital Signs ?BP 123/79 (BP Location: Left Arm)   Pulse 66   Temp 98.8 ?F (37.1 ?C) (Oral)   Resp 20   LMP 05/16/2021   SpO2 99%  ? ?Visual Acuity ?Right Eye Distance:   ?Left Eye Distance:   ?Bilateral Distance:   ? ?Right Eye Near:   ?Left Eye Near:    ?Bilateral Near:    ? ?Physical Exam ?Constitutional:   ?   General: She is not in acute distress. ?   Appearance: She is well-developed.  ?HENT:  ?   Head: Normocephalic and atraumatic.  ?   Right Ear: Tympanic membrane and ear canal normal. Tympanic membrane is not erythematous.  ?   Left Ear: Tympanic membrane and ear canal normal. Tympanic membrane is not erythematous.  ?   Nose: No congestion or rhinorrhea.  ?   Mouth/Throat:  ?   Mouth: Mucous membranes are moist.  ?   Pharynx: Uvula midline. Pharyngeal swelling and posterior oropharyngeal erythema present. No uvula swelling.  ?   Tonsils: No tonsillar exudate. 1+ on the right. 1+ on the left.  ?Eyes:  ?   Conjunctiva/sclera: Conjunctivae normal.  ?   Pupils: Pupils are equal, round, and reactive to light.  ?Cardiovascular:  ?   Rate and Rhythm: Normal rate and regular rhythm.  ?  Heart sounds: Normal heart sounds.  ?Pulmonary:  ?   Effort: Pulmonary effort  is normal.  ?   Breath sounds: Normal breath sounds.  ?Abdominal:  ?   General: Bowel sounds are normal.  ?   Palpations: Abdomen is soft.  ?Musculoskeletal:  ?   Cervical back: Normal range of motion.  ?Lymphadenopathy:  ?   Cervical: No cervical adenopathy.  ?Skin: ?   General: Skin is warm and dry.  ?Neurological:  ?   General: No focal deficit present.  ?   Mental Status: She is alert and oriented to person, place, and time.  ?Psychiatric:     ?   Mood and Affect: Mood normal.     ?   Behavior: Behavior normal.  ? ? ? ?UC Treatments / Results  ?Labs ?(all labs ordered are listed, but only abnormal results are displayed) ?Labs Reviewed  ?POCT RAPID STREP A, ED / UC  ? ? ?EKG ? ? ?Radiology ?No results found. ? ?Procedures ?Procedures (including critical care time) ? ?Medications Ordered in UC ?Medications - No data to display ? ?Initial Impression / Assessment and Plan / UC Course  ?I have reviewed the triage vital signs and the nursing notes. ? ?Pertinent labs & imaging results that were available during my care of the patient were reviewed by me and considered in my medical decision making (see chart for details). ? ?Patient presents with sore throat for 3 days.  She also complains of tonsil swelling.  She has symptoms consistent with upper respiratory infection, she has not had fever, chills, or any GI symptoms.  Throat culture has been ordered.  Patient informed that she will be contacted if it is positive.  In the interim we will provide patient with symptomatic treatment.  Also recommended supportive care to include getting plenty of rest and plenty of fluids.  Patient encouraged to follow-up if symptoms worsen or do not improve. ? ?Final Clinical Impressions(s) / UC Diagnoses  ? ?Final diagnoses:  ?None  ? ?Discharge Instructions   ?None ?  ? ?ED Prescriptions   ?None ?  ? ?PDMP not reviewed this encounter. ?  ?Tish Men, NP ?06/06/21 [phone] ? ?

## 2021-06-06 NOTE — ED Triage Notes (Signed)
Pt c/o sore throat and swollen tonsils. ?Started: 3 days ago  ?

## 2021-06-06 NOTE — Discharge Instructions (Addendum)
Negative strep test today.  Throat culture has been ordered, we will contact you if your results are positive. ?Take medication as prescribed. ?May take over-the-counter ibuprofen or Tylenol as needed for pain, fever, or general discomfort. ?Warm salt water gargles 3-4 times daily to help with symptoms. ?Follow-up if symptoms worsen or do not improve. ?

## 2021-06-07 ENCOUNTER — Ambulatory Visit (INDEPENDENT_AMBULATORY_CARE_PROVIDER_SITE_OTHER): Payer: 59 | Admitting: Physician Assistant

## 2021-06-08 LAB — CULTURE, GROUP A STREP (THRC)

## 2021-08-06 ENCOUNTER — Ambulatory Visit (HOSPITAL_COMMUNITY): Admission: EM | Admit: 2021-08-06 | Discharge: 2021-08-06 | Discharge status: Against Medical Advice | Payer: Self-pay

## 2021-08-07 ENCOUNTER — Ambulatory Visit (HOSPITAL_COMMUNITY)
Admission: EM | Admit: 2021-08-07 | Discharge: 2021-08-07 | Disposition: A | Discharge status: Home / Self Care | Payer: Self-pay | Attending: Family Medicine | Admitting: Family Medicine

## 2021-08-07 ENCOUNTER — Encounter (HOSPITAL_COMMUNITY): Payer: Self-pay | Admitting: Emergency Medicine

## 2021-08-07 DIAGNOSIS — J4521 Mild intermittent asthma with (acute) exacerbation: Secondary | ICD-10-CM

## 2021-08-07 MED ORDER — PREDNISONE 20 MG PO TABS: 40.0000 mg | ORAL_TABLET | Freq: Every day | ORAL | 0 refills | Status: AC

## 2021-08-07 MED ORDER — ALBUTEROL SULFATE (2.5 MG/3ML) 0.083% IN NEBU: 2.5000 mg | INHALATION_SOLUTION | RESPIRATORY_TRACT | 0 refills | Status: DC | PRN

## 2021-08-07 MED ORDER — ALBUTEROL SULFATE HFA 108 (90 BASE) MCG/ACT IN AERS: 2.0000 | INHALATION_SPRAY | Freq: Four times a day (QID) | RESPIRATORY_TRACT | 0 refills | Status: DC | PRN

## 2021-08-07 NOTE — ED Triage Notes (Signed)
Pt reports has asthma and since yesterday when breathing in chest feels tight. Tried neb tx last night. Out of inhaler. Has PCP and states made aware out of inhaler.  ?

## 2021-08-07 NOTE — ED Provider Notes (Signed)
?[place] ? ? ? ?CSN: [id number] ?Arrival date & time: 08/07/21  0803 ? ? ?  ? ?History   ?Chief Complaint ?Chief Complaint  ?Patient presents with  ? Asthma  ? ? ?HPI ?Sherry Schmidt is a 26 y.o. female.  ? ? ?Asthma ? ?Here for some tightness in her chest and chest pain when trying to breathe deeply.  It is also burning in her central chest.  She did try nebulizer treatment yesterday, and did not seem to help much.  She acknowledges she is not sure how old that medicine is. ? ?No fever.  No vomiting or diarrhea.  She has had some cough.  No nasal congestion ? ?Past Medical History:  ?Diagnosis Date  ? Asthma   ? Back pain   ? Back pain   ? Depression   ? Panic attacks   ? Thyroid disease   ? ? ?Patient Active Problem List  ? Diagnosis Date Noted  ? Hypothyroidism 05/12/2020  ? Transaminitis 03/06/2020  ? Abnormal thyroid blood test 03/06/2020  ? Mixed hyperlipidemia 03/06/2020  ? Vitamin D deficiency 02/18/2020  ? Insulin resistance 02/18/2020  ? Depression with anxiety 02/18/2020  ? Class 3 severe obesity with serious comorbidity and body mass index (BMI) of 40.0 to 44.9 in adult [facility]) 02/18/2020  ? Fatigue 11/06/2016  ? Postprocedural hypothyroidism 10/16/2016  ? ? ?Past Surgical History:  ?Procedure Laterality Date  ? THYROIDECTOMY    ? ? ?OB History   ? ? Gravida  ?0  ? Para  ?0  ? Term  ?0  ? Preterm  ?0  ? AB  ?0  ? Living  ?0  ?  ? ? SAB  ?0  ? IAB  ?0  ? Ectopic  ?0  ? Multiple  ?0  ? Live Births  ?0  ?   ?  ?  ? ? ? ?Home Medications   ? ?Prior to Admission medications   ?Medication Sig Start Date End Date Taking? Authorizing Provider  ?albuterol (PROVENTIL) (2.5 MG/3ML) 0.083% nebulizer solution Take 3 mLs (2.5 mg total) by nebulization every 4 (four) hours as needed for wheezing or shortness of breath. 08/07/21  Yes Barrett Henle, MD  ?predniSONE (DELTASONE) 20 MG tablet Take 2 tablets (40 mg total) by mouth daily with breakfast for 5 days. 08/07/21 08/12/21 Yes Jaquita Bessire, Gwenlyn Perking, MD   ?albuterol (VENTOLIN HFA) 108 (90 Base) MCG/ACT inhaler Inhale 2 puffs into the lungs every 6 (six) hours as needed for wheezing or shortness of breath. 08/07/21   Barrett Henle, MD  ?levothyroxine (SYNTHROID) 100 MCG tablet Take 1 tablet (100 mcg total) by mouth daily before breakfast. 05/10/21   Laqueta Linden, MD  ?metFORMIN (GLUCOPHAGE) 500 MG tablet 1/2 tab with breakfast for one week then increase to 1 full tab with breakfast 08/14/20   Abby Potash, PA-C  ?sertraline (ZOLOFT) 50 MG tablet Take 1.5 tablets (75 mg total) by mouth daily. Take one tablet by mouth daily 05/10/21   Laqueta Linden, MD  ?Vitamin D, Ergocalciferol, (DRISDOL) 1.25 MG (50000 UNIT) CAPS capsule Take 1 capsule (50,000 Units total) by mouth every 3 (three) days. 08/14/20   Abby Potash, PA-C  ? ? ?Family History ?Family History  ?Problem Relation Age of Onset  ? Hypertension Mother   ? Depression Mother   ? Bipolar disorder Mother   ? Drug abuse Mother   ? Obesity Mother   ? Diabetes Father   ? Thyroid disease Father   ?  Cancer Father   ? Depression Father   ? Anxiety disorder Father   ? Bipolar disorder Father   ? Schizophrenia Father   ? Liver disease Father   ? Sleep apnea Father   ? Alcohol abuse Father   ? Drug abuse Father   ? Obesity Father   ? ? ?Social History ?Social History  ? ?Tobacco Use  ? Smoking status: Never  ? Smokeless tobacco: Never  ?Vaping Use  ? Vaping Use: Never used  ?Substance Use Topics  ? Alcohol use: Yes  ?  Comment: occasional  ? Drug use: No  ? ? ? ?Allergies   ?Patient has no known allergies. ? ? ?Review of Systems ?Review of Systems ? ? ?Physical Exam ?Triage Vital Signs ?ED Triage Vitals  ?Enc Vitals Group  ?   BP 08/07/21 0825 132/83  ?   Pulse Rate 08/07/21 0825 (!) 52  ?   Resp 08/07/21 0825 17  ?   Temp 08/07/21 0825 97.8 ?F (36.6 ?C)  ?   Temp Source 08/07/21 0825 Oral  ?   SpO2 08/07/21 0825 97 %  ?   Weight --   ?   Height --   ?   Head Circumference --   ?   Peak Flow --   ?   Pain  Score 08/07/21 0824 0  ?   Pain Loc --   ?   Pain Edu? --   ?   Excl. in [place]? --   ? ?No data found. ? ?Updated Vital Signs ?BP 132/83 (BP Location: Left Arm)   Pulse (!) 52   Temp 97.8 ?F (36.6 ?C) (Oral)   Resp 17   LMP 07/21/2021   SpO2 97%  ? ?Visual Acuity ?Right Eye Distance:   ?Left Eye Distance:   ?Bilateral Distance:   ? ?Right Eye Near:   ?Left Eye Near:    ?Bilateral Near:    ? ?Physical Exam ?Vitals reviewed.  ?Constitutional:   ?   General: She is not in acute distress. ?   Appearance: She is not ill-appearing, toxic-appearing or diaphoretic.  ?HENT:  ?   Nose: Nose normal.  ?   Mouth/Throat:  ?   Mouth: Mucous membranes are moist.  ?   Pharynx: No oropharyngeal exudate or posterior oropharyngeal erythema.  ?Eyes:  ?   Extraocular Movements: Extraocular movements intact.  ?   Conjunctiva/sclera: Conjunctivae normal.  ?   Pupils: Pupils are equal, round, and reactive to light.  ?Cardiovascular:  ?   Rate and Rhythm: Normal rate and regular rhythm.  ?   Heart sounds: No murmur heard. ?Pulmonary:  ?   Effort: Pulmonary effort is normal. No respiratory distress.  ?   Comments: She has low pitched wheezes in all lung fields.  Air movement is actually good ?Musculoskeletal:  ?   Cervical back: Neck supple. No tenderness.  ?Lymphadenopathy:  ?   Cervical: No cervical adenopathy.  ?Skin: ?   Capillary Refill: Capillary refill takes less than 2 seconds.  ?   Coloration: Skin is not jaundiced or pale.  ?Neurological:  ?   General: No focal deficit present.  ?   Mental Status: She is alert and oriented to person, place, and time.  ?Psychiatric:     ?   Behavior: Behavior normal.  ? ? ? ?UC Treatments / Results  ?Labs ?(all labs ordered are listed, but only abnormal results are displayed) ?Labs Reviewed - No data to display ? ?EKG ? ? ?  Radiology ?No results found. ? ?Procedures ?Procedures (including critical care time) ? ?Medications Ordered in UC ?Medications - No data to display ? ?Initial Impression /  Assessment and Plan / UC Course  ?I have reviewed the triage vital signs and the nursing notes. ? ?Pertinent labs & imaging results that were available during my care of the patient were reviewed by me and considered in my medical decision making (see chart for details). ? ?  ? ?I still think that she is having an asthma exacerbation.  We will treat with brief course of steroids and an inhaler prescription ?Final Clinical Impressions(s) / UC Diagnoses  ? ?Final diagnoses:  ?Mild intermittent asthma with exacerbation  ? ?Discharge Instructions   ?None ?  ? ?ED Prescriptions   ? ? Medication Sig Dispense Auth. Provider  ? albuterol (VENTOLIN HFA) 108 (90 Base) MCG/ACT inhaler Inhale 2 puffs into the lungs every 6 (six) hours as needed for wheezing or shortness of breath. 1 each Barrett Henle, MD  ? albuterol (PROVENTIL) (2.5 MG/3ML) 0.083% nebulizer solution Take 3 mLs (2.5 mg total) by nebulization every 4 (four) hours as needed for wheezing or shortness of breath. 75 mL Barrett Henle, MD  ? predniSONE (DELTASONE) 20 MG tablet Take 2 tablets (40 mg total) by mouth daily with breakfast for 5 days. 10 tablet Barrett Henle, MD  ? ?  ? ?PDMP not reviewed this encounter. ?  ?Barrett Henle, MD ?08/07/21 [phone] ? ?

## 2021-08-07 NOTE — Discharge Instructions (Addendum)
Prednisone 20 mg--take 2 daily for 5 days ? ?Albuterol inhaler--do 2 puffs every 4 hours as needed for shortness of breath or wheezing  ? ?Albuterol for nebulization--use 1 ampoule in your nebulizer every 4 hours as needed for shortness of breath or wheezing ?

## 2021-09-05 ENCOUNTER — Emergency Department (HOSPITAL_BASED_OUTPATIENT_CLINIC_OR_DEPARTMENT_OTHER): Payer: 59

## 2021-09-05 ENCOUNTER — Other Ambulatory Visit: Payer: Self-pay

## 2021-09-05 ENCOUNTER — Emergency Department (HOSPITAL_BASED_OUTPATIENT_CLINIC_OR_DEPARTMENT_OTHER): Payer: 59 | Admitting: Radiology

## 2021-09-05 ENCOUNTER — Other Ambulatory Visit (HOSPITAL_BASED_OUTPATIENT_CLINIC_OR_DEPARTMENT_OTHER): Payer: Self-pay

## 2021-09-05 ENCOUNTER — Emergency Department (HOSPITAL_BASED_OUTPATIENT_CLINIC_OR_DEPARTMENT_OTHER)
Admission: EM | Admit: 2021-09-05 | Discharge: 2021-09-05 | Disposition: A | Discharge status: Home / Self Care | Payer: 59 | Attending: Emergency Medicine | Admitting: Emergency Medicine

## 2021-09-05 ENCOUNTER — Encounter (HOSPITAL_BASED_OUTPATIENT_CLINIC_OR_DEPARTMENT_OTHER): Payer: Self-pay

## 2021-09-05 DIAGNOSIS — T148XXA Other injury of unspecified body region, initial encounter: Secondary | ICD-10-CM

## 2021-09-05 DIAGNOSIS — Y92410 Unspecified street and highway as the place of occurrence of the external cause: Secondary | ICD-10-CM | POA: Insufficient documentation

## 2021-09-05 DIAGNOSIS — M542 Cervicalgia: Secondary | ICD-10-CM | POA: Diagnosis present

## 2021-09-05 DIAGNOSIS — R519 Headache, unspecified: Secondary | ICD-10-CM | POA: Diagnosis not present

## 2021-09-05 MED ORDER — OXYCODONE-ACETAMINOPHEN 5-325 MG PO TABS
2.0000 | ORAL_TABLET | Freq: Once | ORAL | Status: AC
  Administered 2021-09-05: 2 via ORAL
  Filled 2021-09-05: qty 2

## 2021-09-05 MED ORDER — OXYCODONE-ACETAMINOPHEN 5-325 MG PO TABS
1.0000 | ORAL_TABLET | Freq: Four times a day (QID) | ORAL | 0 refills | Status: DC | PRN
  Filled 2021-09-05: qty 15, 3d supply, fill #0

## 2021-09-05 MED ORDER — METHOCARBAMOL 500 MG PO TABS
500.0000 mg | ORAL_TABLET | Freq: Two times a day (BID) | ORAL | 0 refills | Status: DC
Start: 2021-09-05 — End: 2022-02-26
  Filled 2021-09-05: qty 20, 10d supply, fill #0
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
## 2021-09-14 ENCOUNTER — Other Ambulatory Visit (INDEPENDENT_AMBULATORY_CARE_PROVIDER_SITE_OTHER): Payer: Self-pay | Admitting: Family Medicine

## 2021-09-14 DIAGNOSIS — E038 Other specified hypothyroidism: Secondary | ICD-10-CM

## 2021-09-14 DIAGNOSIS — F418 Other specified anxiety disorders: Secondary | ICD-10-CM

## 2021-09-17 ENCOUNTER — Other Ambulatory Visit (INDEPENDENT_AMBULATORY_CARE_PROVIDER_SITE_OTHER): Payer: Self-pay | Admitting: Family Medicine

## 2021-09-17 DIAGNOSIS — F418 Other specified anxiety disorders: Secondary | ICD-10-CM

## 2021-09-17 DIAGNOSIS — E038 Other specified hypothyroidism: Secondary | ICD-10-CM

## 2021-09-20 ENCOUNTER — Ambulatory Visit (INDEPENDENT_AMBULATORY_CARE_PROVIDER_SITE_OTHER): Payer: 59 | Admitting: Adult Health

## 2021-09-20 ENCOUNTER — Encounter (INDEPENDENT_AMBULATORY_CARE_PROVIDER_SITE_OTHER): Payer: Self-pay | Admitting: Adult Health

## 2021-09-20 ENCOUNTER — Other Ambulatory Visit (INDEPENDENT_AMBULATORY_CARE_PROVIDER_SITE_OTHER): Payer: Self-pay | Admitting: Adult Health

## 2021-09-20 VITALS — BP 111/73 | HR 68 | Temp 98.2°F | Ht 73.0 in | Wt 256.0 lb

## 2021-09-20 DIAGNOSIS — E8881 Metabolic syndrome: Secondary | ICD-10-CM

## 2021-09-20 DIAGNOSIS — E038 Other specified hypothyroidism: Secondary | ICD-10-CM

## 2021-09-20 DIAGNOSIS — F418 Other specified anxiety disorders: Secondary | ICD-10-CM | POA: Diagnosis not present

## 2021-09-20 DIAGNOSIS — Z7984 Long term (current) use of oral hypoglycemic drugs: Secondary | ICD-10-CM

## 2021-09-20 DIAGNOSIS — E559 Vitamin D deficiency, unspecified: Secondary | ICD-10-CM

## 2021-09-20 DIAGNOSIS — Z6833 Body mass index (BMI) 33.0-33.9, adult: Secondary | ICD-10-CM

## 2021-09-20 DIAGNOSIS — E669 Obesity, unspecified: Secondary | ICD-10-CM

## 2021-09-20 DIAGNOSIS — R7303 Prediabetes: Secondary | ICD-10-CM

## 2021-09-20 MED ORDER — SERTRALINE HCL 50 MG PO TABS: 75.0000 mg | ORAL_TABLET | Freq: Every day | ORAL | 0 refills | Status: DC

## 2021-09-20 MED ORDER — METFORMIN HCL 500 MG PO TABS: ORAL_TABLET | ORAL | 0 refills | Status: DC

## 2021-09-20 MED ORDER — LEVOTHYROXINE SODIUM 100 MCG PO TABS: 100.0000 ug | ORAL_TABLET | Freq: Every day | ORAL | 0 refills | Status: DC

## 2021-09-20 NOTE — Telephone Encounter (Signed)
Pt. Seen by Valetta Fuller.

## 2021-09-24 NOTE — Progress Notes (Signed)
Chief Complaint:   OBESITY Sherry Schmidt is here to discuss her progress with her obesity treatment plan along with follow-up of her obesity related diagnoses. Sherry Schmidt is on keeping a food journal and adhering to recommended goals of 1900 calories and 120+ grams of protein daily and states she is following her eating plan approximately 85% of the time. Sherry Schmidt states she is at the gym for 120 minutes 4 times per week.  Today's visit was #: 15 Starting weight: 312 lbs Starting date: 11/10/2019 Today's weight: 256 lbs Today's date: 09/20/2021 Total lbs lost to date: 87 Total lbs lost since last in-office visit: 4  Interim History:  Sherry Schmidt's last in office visit with healthy weight and wellness was on 05/10/2021 with Dr. Jearld Shines.   She lost her job at [employer] weeks.  She has been off her medications for 1 week.   Subjective:   1. Other specified hypothyroidism Sherry Schmidt has been on levothyroxine 100 mcg for >1 week.   She denies acute cardiac symptoms.  2. Depression with anxiety Sherry Schmidt has been off sertraline 50 mg 1-1/2 tablet daily for > 7 days.   She denies acute withdrawal symptoms.  3. Vitamin D deficiency Sherry Schmidt is on OTC vitamin D3 5,000 units daily, 2 tablets daily=10,000 units daily.  4. Insulin resistance Sherry Schmidt was previously on metformin, and tolerated it well.  Assessment/Plan:   1. Other specified hypothyroidism Sherry Schmidt agreed to restart levothyroxine 100 mcg 1 tablet daily, and we will refill for 90 days.  - levothyroxine (SYNTHROID) 100 MCG tablet; Take 1 tablet (100 mcg total) by mouth daily before breakfast.  Dispense: 90 tablet; Refill: 0  2. Depression with anxiety Sherry Schmidt agreed to restart sertraline 50 mg 1-1/2 tablets daily, and we will refill for 90 days.  - sertraline (ZOLOFT) 50 MG tablet; Take 1.5 tablets (75 mg total) by mouth daily. Take one tablet by mouth daily  Dispense: 135 tablet; Refill: 0  3. Vitamin D  deficiency We will check labs today, and we will follow-up at Sherry Schmidt's next office visit.  - VITAMIN D 25 Hydroxy (Vit-D Deficiency, Fractures)  4. Insulin resistance We will check labs today. Sherry Schmidt agreed to restart metformin 500 mg 1/2 tablet daily for 1 week, then increase to 1 full tablet.  - metFORMIN (GLUCOPHAGE) 500 MG tablet; 1/2 tab with breakfast for one week then increase to 1 full tab with breakfast  Dispense: 30 tablet; Refill: 0 - Comprehensive metabolic panel - Hemoglobin A1c - Insulin, random  5. Obesity with current BMI of 33.9 Sherry Schmidt is currently in the action stage of change. As such, her goal is to continue with weight loss efforts. She has agreed to keeping a food journal and adhering to recommended goals of 1900 calories and 120 grams of protein daily.   Exercise goals: As is.   Behavioral modification strategies: increasing lean protein intake, decreasing simple carbohydrates, meal planning and cooking strategies, keeping healthy foods in the home, and planning for success.  Sherry Schmidt has agreed to follow-up with our clinic in 4 weeks. She was informed of the importance of frequent follow-up visits to maximize her success with intensive lifestyle modifications for her multiple health conditions.   Sherry Schmidt was informed we would discuss her lab results at her next visit unless there is a critical issue that needs to be addressed sooner. Sherry Schmidt agreed to keep her next visit at the agreed upon time to discuss these results.  Objective:   Blood pressure 111/73, pulse 68, temperature  98.2 F (36.8 C), height '6\' 1"'$  (1.854 m), weight 256 lb (116.1 kg), SpO2 100 %. Body mass index is 33.78 kg/m.  General: Cooperative, alert, well developed, in no acute distress. HEENT: Conjunctivae and lids unremarkable. Cardiovascular: Regular rhythm.  Lungs: Normal work of breathing. Neurologic: No focal deficits.   Lab Results  Component Value Date   CREATININE  0.77 12/14/2020   BUN 9 12/14/2020   NA 140 12/14/2020   K 3.9 12/14/2020   CL 103 12/14/2020   CO2 18 (L) 12/14/2020   Lab Results  Component Value Date   ALT 18 12/14/2020   AST 17 12/14/2020   ALKPHOS 82 12/14/2020   BILITOT 0.5 12/14/2020   Lab Results  Component Value Date   HGBA1C 5.3 12/14/2020   HGBA1C 5.4 03/06/2020   HGBA1C 5.6 11/10/2019   Lab Results  Component Value Date   INSULIN 15.8 12/14/2020   INSULIN 80.9 (H) 03/06/2020   INSULIN 29.3 (H) 11/10/2019   Lab Results  Component Value Date   TSH 2.490 12/14/2020   Lab Results  Component Value Date   CHOL 187 12/14/2020   HDL 46 12/14/2020   LDLCALC 117 (H) 12/14/2020   TRIG 135 12/14/2020   Lab Results  Component Value Date   VD25OH 40.3 12/14/2020   VD25OH 17.5 (L) 03/06/2020   VD25OH 18.8 (L) 11/10/2019   Lab Results  Component Value Date   WBC 12.8 (H) 11/10/2019   HGB 15.5 11/10/2019   HCT 47.8 (H) 11/10/2019   MCV 90 11/10/2019   PLT 300 11/10/2019   No results found for: "IRON", "TIBC", "FERRITIN"  Attestation Statements:   Reviewed by clinician on day of visit: allergies, medications, problem list, medical history, surgical history, family history, social history, and previous encounter notes.   Wilhemena Durie, am acting as transcriptionist for Mina Marble, NP.  I have reviewed the above documentation for accuracy and completeness, and I agree with the above. -  Farah Lepak d. Kresha Abelson, NP-C

## 2021-09-27 DIAGNOSIS — R7303 Prediabetes: Secondary | ICD-10-CM | POA: Insufficient documentation

## 2021-10-10 ENCOUNTER — Other Ambulatory Visit (INDEPENDENT_AMBULATORY_CARE_PROVIDER_SITE_OTHER): Payer: Self-pay | Admitting: Adult Health

## 2021-10-10 DIAGNOSIS — E88819 Insulin resistance, unspecified: Secondary | ICD-10-CM

## 2021-10-10 DIAGNOSIS — E8881 Metabolic syndrome: Secondary | ICD-10-CM

## 2021-10-23 ENCOUNTER — Ambulatory Visit (INDEPENDENT_AMBULATORY_CARE_PROVIDER_SITE_OTHER): Payer: 59 | Admitting: Adult Health

## 2021-11-07 ENCOUNTER — Encounter (INDEPENDENT_AMBULATORY_CARE_PROVIDER_SITE_OTHER): Payer: Self-pay

## 2021-11-09 ENCOUNTER — Other Ambulatory Visit (INDEPENDENT_AMBULATORY_CARE_PROVIDER_SITE_OTHER): Payer: Self-pay | Admitting: Adult Health

## 2021-11-09 DIAGNOSIS — E8881 Metabolic syndrome: Secondary | ICD-10-CM

## 2021-11-12 IMAGING — DX DG FOOT COMPLETE 3+V*R*
3 series · 3 of 3 positions shown · non-contrast
Comparison: Concurrently obtained radiographs of the right ankle

CLINICAL DATA: Right foot and ankle pain after stepping in a hole
earlier today

EXAM:
RIGHT FOOT COMPLETE - 3+ VIEW

[foot ap]
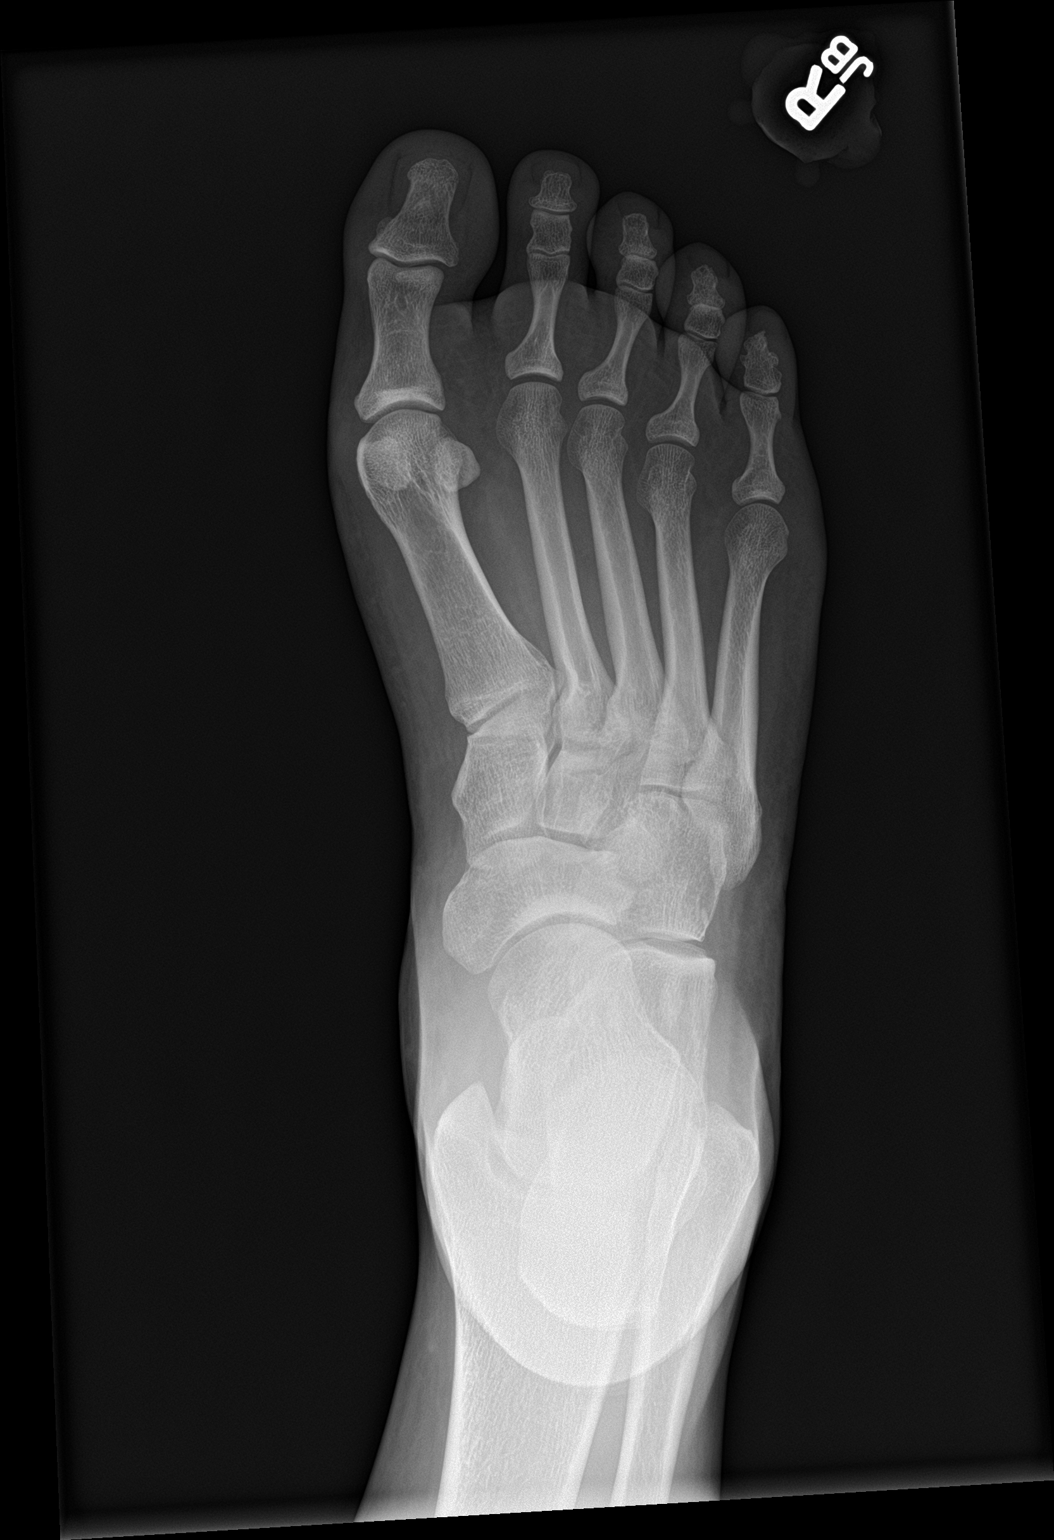

[foot obl]
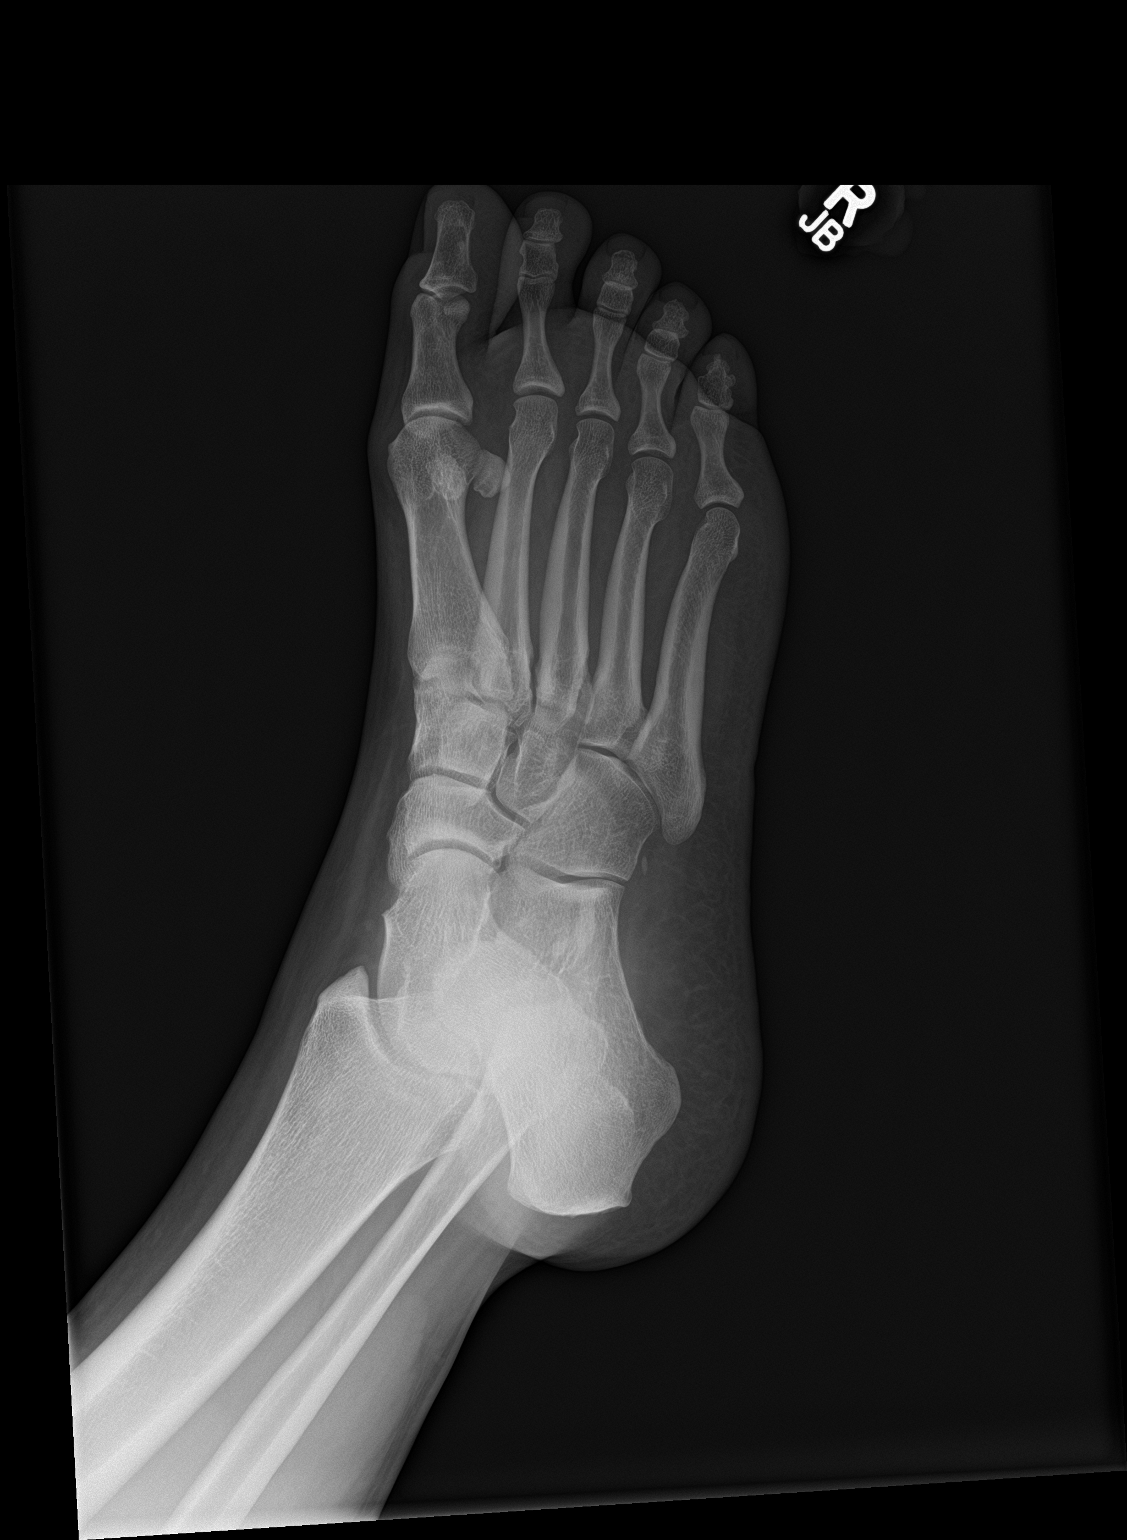

[foot lat]
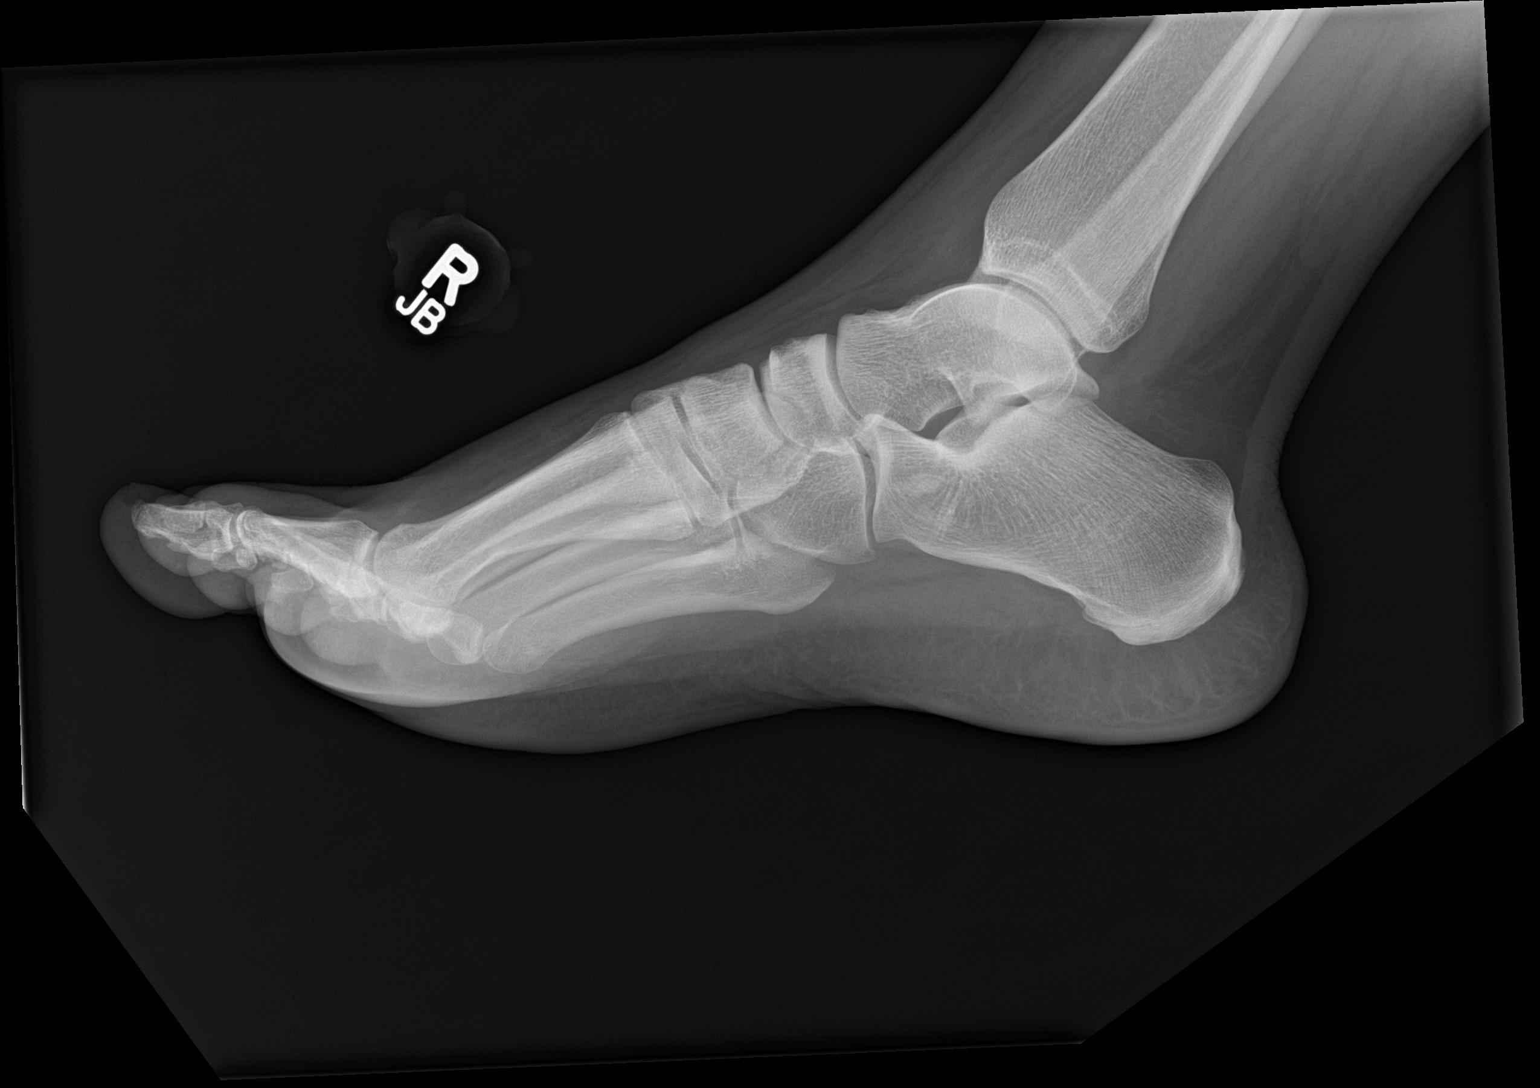

[3 of 3 positions shown; findings below may reference images not displayed]

FINDINGS: There is no evidence of fracture or dislocation. There is no
evidence of arthropathy or other focal bone abnormality. Soft
tissues are unremarkable.
IMPRESSION: Negative.

## 2021-11-12 IMAGING — DX DG ANKLE COMPLETE 3+V*R*
3 series · 3 of 3 positions shown · non-contrast
Comparison: Concurrently obtained radiographs of the right foot

CLINICAL DATA: Right foot and ankle pain after stepping in a hole
earlier today

EXAM:
RIGHT ANKLE - COMPLETE 3+ VIEW

[ankle ap]
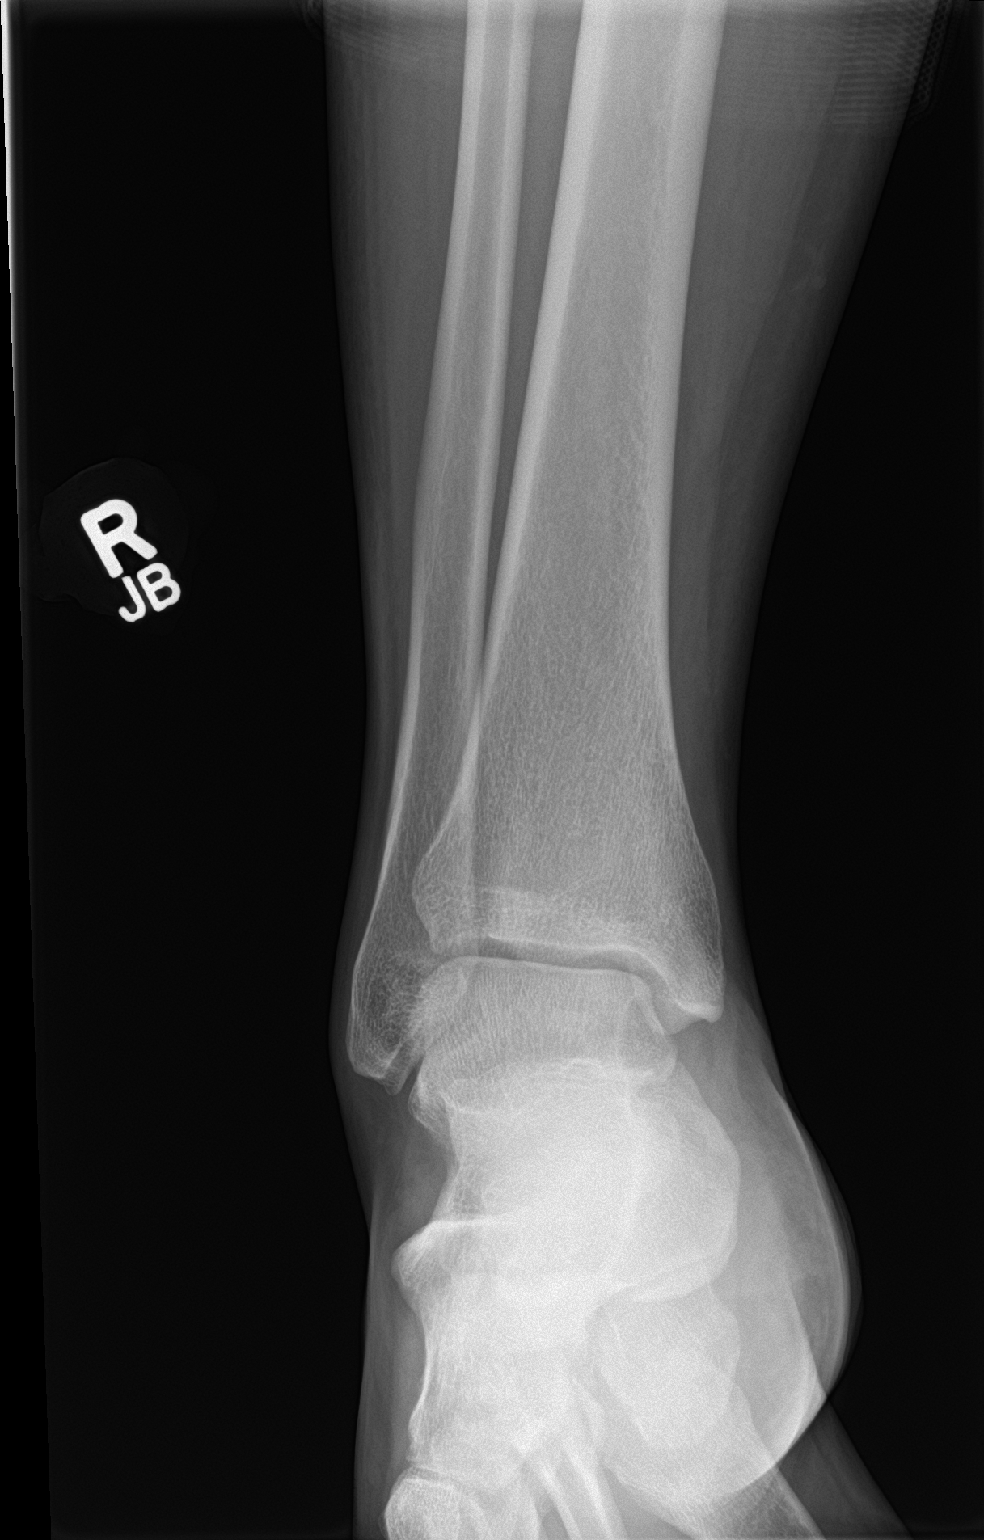

[ankle obl]
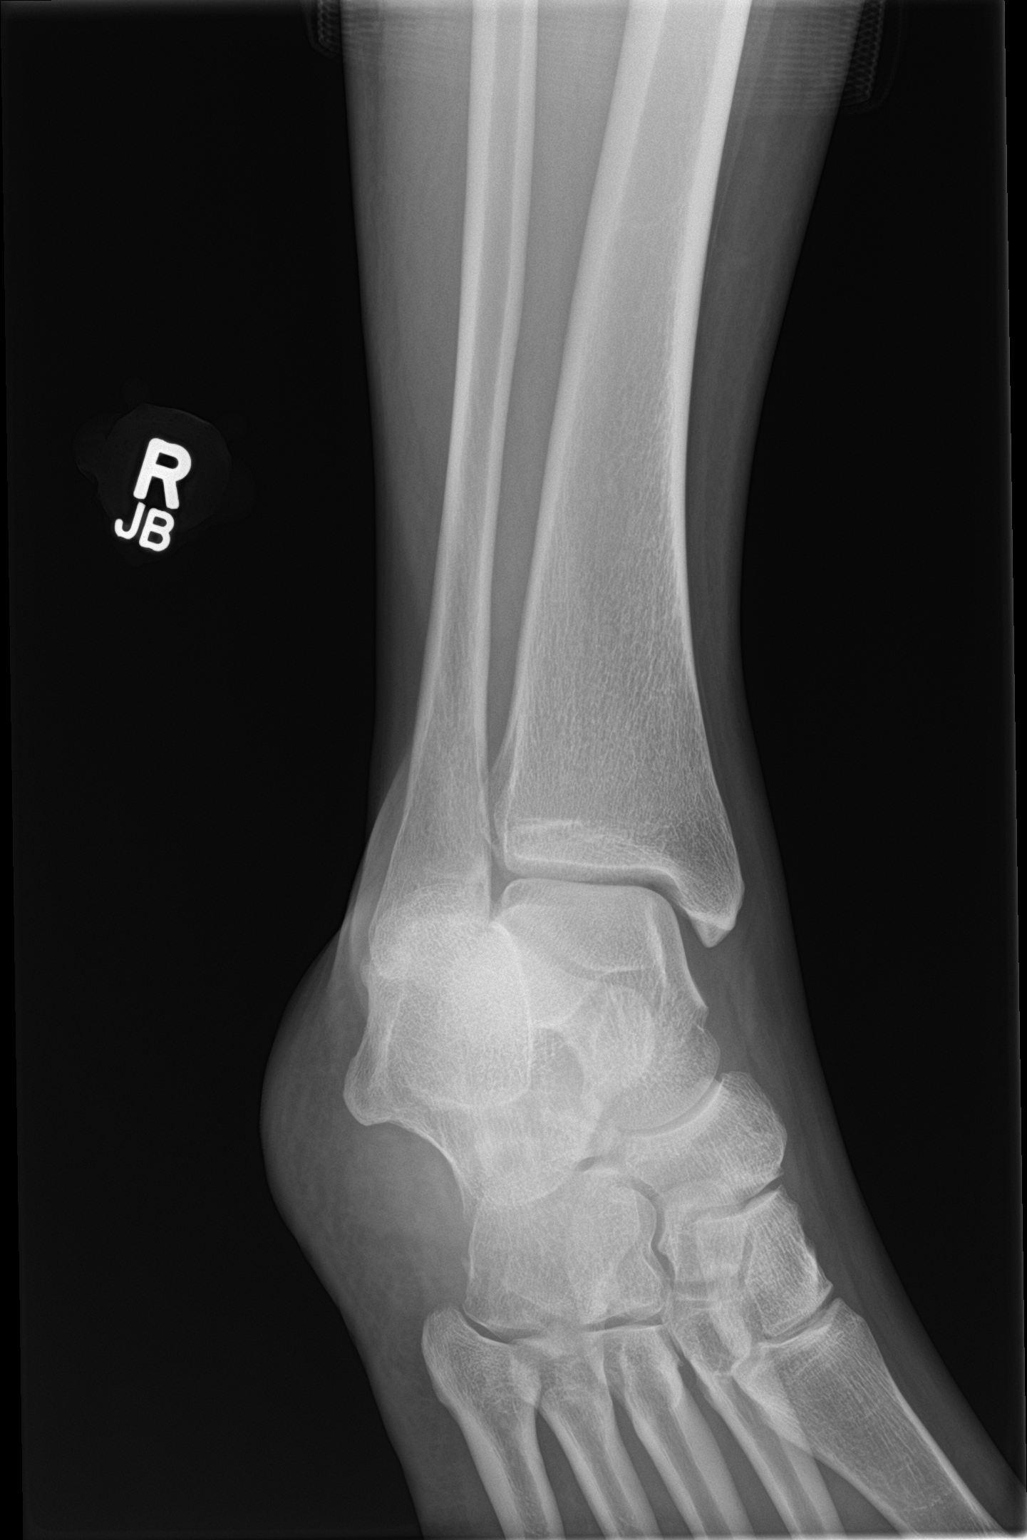

[ankle lat]
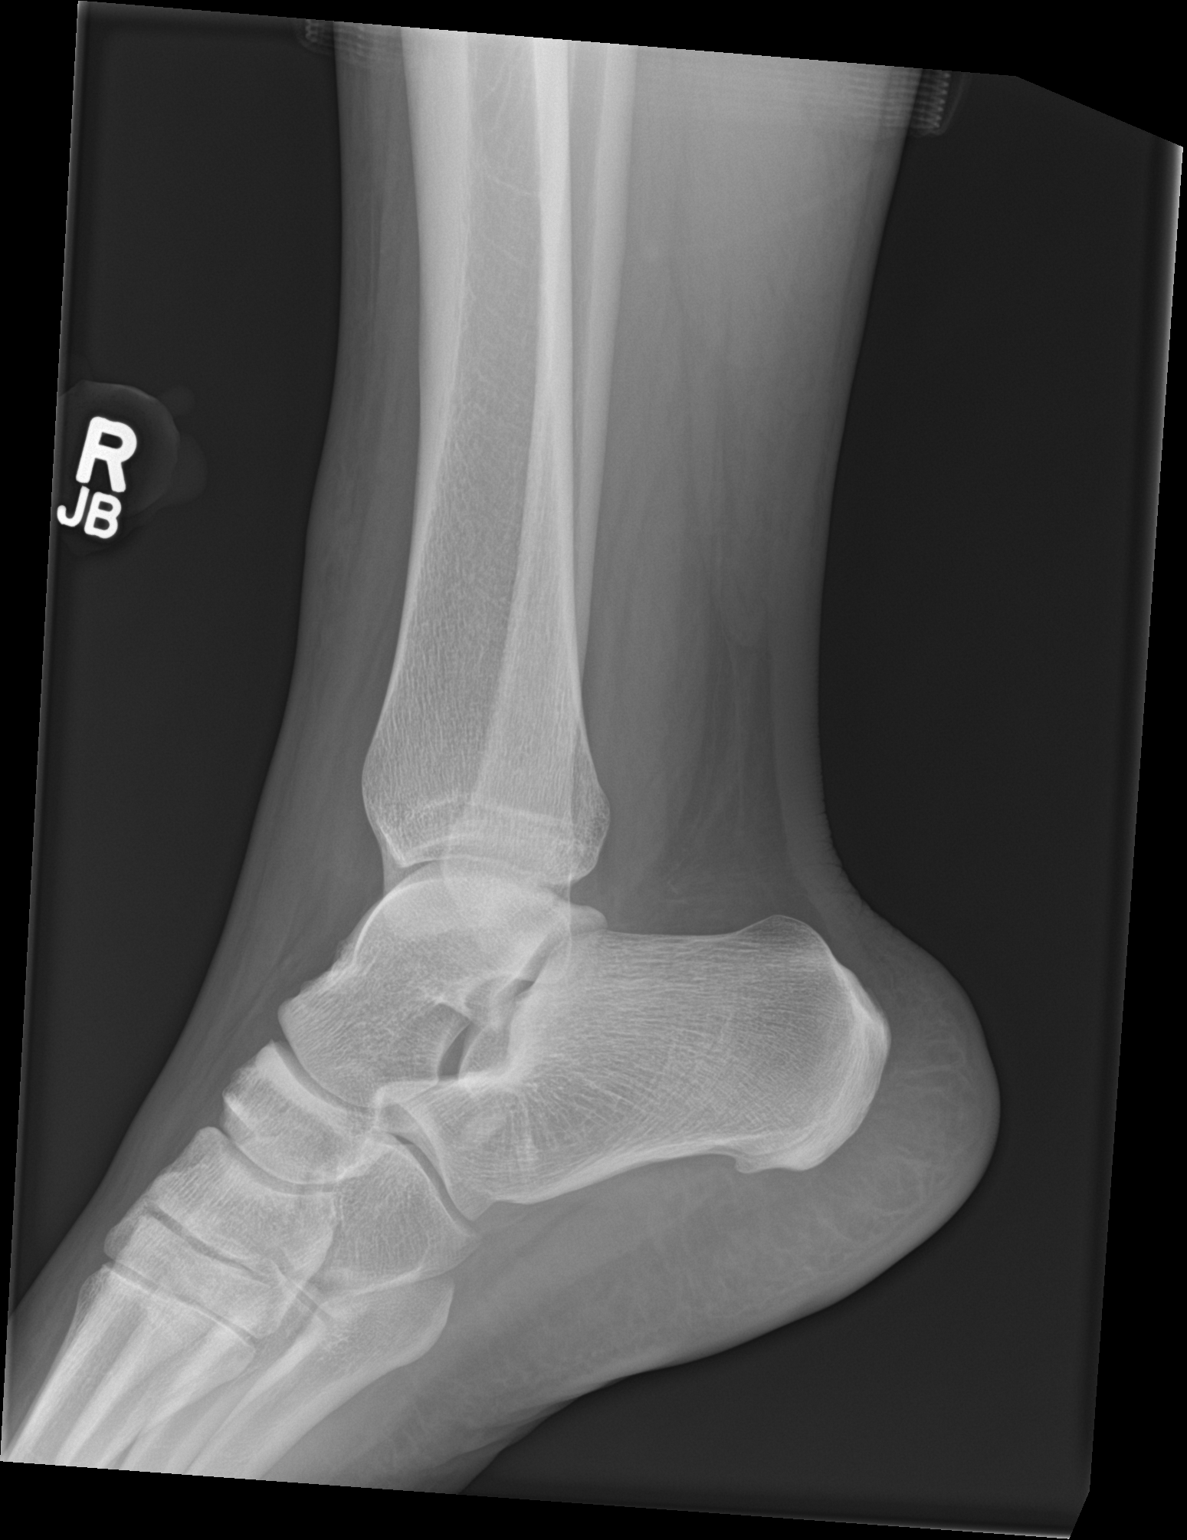

[3 of 3 positions shown; findings below may reference images not displayed]

FINDINGS: There is no evidence of fracture, dislocation, or joint effusion.
There is no evidence of arthropathy or other focal bone abnormality.
Soft tissues are unremarkable.
IMPRESSION: Negative.

## 2021-11-19 ENCOUNTER — Encounter (HOSPITAL_COMMUNITY): Payer: Self-pay | Admitting: Emergency Medicine

## 2021-11-19 ENCOUNTER — Encounter (HOSPITAL_COMMUNITY): Payer: Self-pay

## 2021-11-19 ENCOUNTER — Emergency Department (HOSPITAL_COMMUNITY)
Admission: EM | Admit: 2021-11-19 | Discharge: 2021-11-19 | Disposition: A | Discharge status: Home / Self Care | Payer: 59 | Attending: Emergency Medicine | Admitting: Emergency Medicine

## 2021-11-19 ENCOUNTER — Other Ambulatory Visit: Payer: Self-pay

## 2021-11-19 ENCOUNTER — Ambulatory Visit (HOSPITAL_COMMUNITY)
Admission: EM | Admit: 2021-11-19 | Discharge: 2021-11-19 | Disposition: A | Discharge status: Home / Self Care | Payer: 59 | Attending: Emergency Medicine | Admitting: Emergency Medicine

## 2021-11-19 ENCOUNTER — Emergency Department (HOSPITAL_COMMUNITY): Payer: 59

## 2021-11-19 DIAGNOSIS — J45909 Unspecified asthma, uncomplicated: Secondary | ICD-10-CM | POA: Diagnosis not present

## 2021-11-19 DIAGNOSIS — R1031 Right lower quadrant pain: Secondary | ICD-10-CM

## 2021-11-19 LAB — URINALYSIS, ROUTINE W REFLEX MICROSCOPIC
Bilirubin Urine: NEGATIVE
Glucose, UA: NEGATIVE mg/dL
Hgb urine dipstick: NEGATIVE
Ketones, ur: NEGATIVE mg/dL
Leukocytes,Ua: NEGATIVE
Nitrite: NEGATIVE
Protein, ur: NEGATIVE mg/dL
Specific Gravity, Urine: 1.023 (ref 1.005–1.030)
pH: 5 (ref 5.0–8.0)

## 2021-11-19 LAB — COMPREHENSIVE METABOLIC PANEL
ALT: 13 U/L (ref 0–44)
AST: 14 U/L — ABNORMAL LOW (ref 15–41)
Albumin: 3.8 g/dL (ref 3.5–5.0)
Alkaline Phosphatase: 57 U/L (ref 38–126)
Anion gap: 9 (ref 5–15)
BUN: 11 mg/dL (ref 6–20)
CO2: 20 mmol/L — ABNORMAL LOW (ref 22–32)
Calcium: 8.3 mg/dL — ABNORMAL LOW (ref 8.9–10.3)
Chloride: 109 mmol/L (ref 98–111)
Creatinine, Ser: 0.69 mg/dL (ref 0.44–1.00)
GFR, Estimated: >60 mL/min (ref >60)
Glucose, Bld: 83 mg/dL (ref 70–99)
Potassium: 3.8 mmol/L (ref 3.5–5.1)
Sodium: 138 mmol/L (ref 135–145)
Total Bilirubin: 0.6 mg/dL (ref 0.3–1.2)
Total Protein: 6.9 g/dL (ref 6.5–8.1)

## 2021-11-19 LAB — POCT URINALYSIS DIPSTICK, ED / UC
Bilirubin Urine: NEGATIVE
Glucose, UA: NEGATIVE mg/dL
Ketones, ur: NEGATIVE mg/dL
Leukocytes,Ua: NEGATIVE
Nitrite: NEGATIVE
Protein, ur: NEGATIVE mg/dL
Specific Gravity, Urine: 1.015 (ref 1.005–1.030)
Urobilinogen, UA: 0.2 mg/dL (ref 0.0–1.0)
pH: 7 (ref 5.0–8.0)

## 2021-11-19 LAB — CBC WITH DIFFERENTIAL/PLATELET
Abs Immature Granulocytes: 0.06 10*3/uL (ref 0.00–0.07)
Basophils Absolute: 0.1 10*3/uL (ref 0.0–0.1)
Basophils Relative: 1 %
Eosinophils Absolute: 0.3 10*3/uL (ref 0.0–0.5)
Eosinophils Relative: 2 %
HCT: 41.2 % (ref 36.0–46.0)
Hemoglobin: 13.5 g/dL (ref 12.0–15.0)
Immature Granulocytes: 0 %
Lymphocytes Relative: 28 %
Lymphs Abs: 3.9 10*3/uL (ref 0.7–4.0)
MCH: 28.9 pg (ref 26.0–34.0)
MCHC: 32.8 g/dL (ref 30.0–36.0)
MCV: 88.2 fL (ref 80.0–100.0)
Monocytes Absolute: 0.7 10*3/uL (ref 0.1–1.0)
Monocytes Relative: 5 %
Neutro Abs: 8.7 10*3/uL — ABNORMAL HIGH (ref 1.7–7.7)
Neutrophils Relative %: 64 %
Platelets: 295 10*3/uL (ref 150–400)
RBC: 4.67 MIL/uL (ref 3.87–5.11)
RDW: 14.2 % (ref 11.5–15.5)
WBC: 13.8 10*3/uL — ABNORMAL HIGH (ref 4.0–10.5)
nRBC: 0 % (ref 0.0–0.2)

## 2021-11-19 LAB — POC URINE PREG, ED: Preg Test, Ur: NEGATIVE

## 2021-11-19 LAB — LIPASE, BLOOD: Lipase: 29 U/L (ref 11–51)

## 2021-11-19 MED ORDER — SODIUM CHLORIDE (PF) 0.9 % IJ SOLN
INTRAMUSCULAR | Status: AC
  Filled 2021-11-19: qty 50

## 2021-11-19 MED ORDER — IOHEXOL 300 MG/ML  SOLN
100.0000 mL | Freq: Once | INTRAMUSCULAR | Status: AC | PRN
  Administered 2021-11-19: 100 mL via INTRAVENOUS

## 2021-11-19 MED ORDER — SODIUM CHLORIDE 0.9 % IV BOLUS: 1000.0000 mL | Freq: Once | INTRAVENOUS | Status: DC

## 2021-11-19 NOTE — ED Provider Notes (Signed)
[facility] DEPT Provider Note   CSN: [id number] Arrival date & time: 11/19/21  7124     History  Chief Complaint  Patient presents with   Abdominal Pain    Sherry Schmidt is a 26 y.o. female. Patient presents to the emergency department after evaluation at urgent care complaining of RLQ abdominal pain. Patient states she began to have dull pain in the right lower quadrant yesterday morning. She felt like it may be the onset of menstrual cramping. She took Pamprin with no relief. She states that the pain is dull as long as she sits still, and 2/10 severity. She states the pain increases to 6/10 with movement. With palpation of the RLQ pain increases to 7 or 8/10 in severity. Patient denies nausea, vomiting, shortness of breath, urinary symptoms, vaginal bleeding, vaginal discharge. Past medical history significant for asthma, thyroid disease, depression, back pain  HPI     Home Medications Prior to Admission medications   Medication Sig Start Date End Date Taking? Authorizing Provider  albuterol (PROVENTIL) (2.5 MG/3ML) 0.083% nebulizer solution Take 3 mLs (2.5 mg total) by nebulization every 4 (four) hours as needed for wheezing or shortness of breath. 08/07/21   Barrett Henle, MD  albuterol (VENTOLIN HFA) 108 (90 Base) MCG/ACT inhaler Inhale 2 puffs into the lungs every 6 (six) hours as needed for wheezing or shortness of breath. 08/07/21   Barrett Henle, MD  levothyroxine (SYNTHROID) 100 MCG tablet Take 1 tablet (100 mcg total) by mouth daily before breakfast. 09/20/21   Danford, Valetta Fuller D, NP  metFORMIN (GLUCOPHAGE) 500 MG tablet 1/2 tab with breakfast for one week then increase to 1 full tab with breakfast 09/20/21   Danford, Valetta Fuller D, NP  methocarbamol (ROBAXIN) 500 MG tablet Take 1 tablet (500 mg total) by mouth 2 (two) times daily. 09/05/21   Lacretia Leigh, MD  oxyCODONE-acetaminophen (PERCOCET/ROXICET) 5-325 MG tablet Take 1-2 tablets by mouth  every 6 (six) hours as needed for severe pain. 09/05/21   Lacretia Leigh, MD  sertraline (ZOLOFT) 50 MG tablet Take 1.5 tablets (75 mg total) by mouth daily. Take one tablet by mouth daily 09/20/21   Danford, Valetta Fuller D, NP  Vitamin D, Ergocalciferol, (DRISDOL) 1.25 MG (50000 UNIT) CAPS capsule Take 1 capsule (50,000 Units total) by mouth every 3 (three) days. 08/14/20   Abby Potash, PA-C      Allergies    Patient has no known allergies.    Review of Systems   Review of Systems  Constitutional:  Negative for appetite change and fever.  Respiratory:  Negative for shortness of breath.   Gastrointestinal:  Positive for abdominal pain. Negative for constipation, diarrhea, nausea and vomiting.  Genitourinary:  Negative for dysuria, vaginal bleeding and vaginal discharge.    Physical Exam Updated Vital Signs BP 126/85   Pulse (!) 59   Temp 98.2 F (36.8 C) (Oral)   Resp 17   LMP 10/10/2021 (Exact Date)   SpO2 99%  Physical Exam Vitals and nursing note reviewed.  Constitutional:      General: She is not in acute distress. HENT:     Head: Normocephalic and atraumatic.     Mouth/Throat:     Mouth: Mucous membranes are moist.  Eyes:     Extraocular Movements: Extraocular movements intact.  Cardiovascular:     Rate and Rhythm: Normal rate and regular rhythm.     Heart sounds: Normal heart sounds.  Pulmonary:     Effort: Pulmonary effort is  normal.     Breath sounds: Normal breath sounds.  Abdominal:     General: Abdomen is flat. Bowel sounds are normal. There is no distension.     Palpations: Abdomen is soft.     Tenderness: There is abdominal tenderness in the right lower quadrant. Positive signs include McBurney's sign.  Skin:    General: Skin is warm and dry.     Capillary Refill: Capillary refill takes less than 2 seconds.  Neurological:     Mental Status: She is alert and oriented to person, place, and time.     ED Results / Procedures / Treatments   Labs (all labs  ordered are listed, but only abnormal results are displayed) Labs Reviewed  CBC WITH DIFFERENTIAL/PLATELET - Abnormal; Notable for the following components:      Result Value   WBC 13.8 (*)    Neutro Abs 8.7 (*)    All other components within normal limits  COMPREHENSIVE METABOLIC PANEL - Abnormal; Notable for the following components:   CO2 20 (*)    Calcium 8.3 (*)    AST 14 (*)    All other components within normal limits  LIPASE, BLOOD  URINALYSIS, ROUTINE W REFLEX MICROSCOPIC    EKG None  Radiology CT Abdomen Pelvis W Contrast  Result Date: 11/19/2021 CLINICAL DATA:  Right lower quadrant abdominal pain EXAM: CT ABDOMEN AND PELVIS WITH CONTRAST TECHNIQUE: Multidetector CT imaging of the abdomen and pelvis was performed using the standard protocol following bolus administration of intravenous contrast. RADIATION DOSE REDUCTION: This exam was performed according to the departmental dose-optimization program which includes automated exposure control, adjustment of the mA and/or kV according to patient size and/or use of iterative reconstruction technique. CONTRAST:  128m OMNIPAQUE IOHEXOL 300 MG/ML  SOLN COMPARISON:  None Available. FINDINGS: Lower chest: No acute abnormality. Hepatobiliary: No focal liver abnormality is seen. No gallstones, gallbladder wall thickening, or biliary dilatation. Pancreas: Unremarkable. No pancreatic ductal dilatation or surrounding inflammatory changes. Spleen: No splenic injury or perisplenic hematoma. Adrenals/Urinary Tract: Adrenal glands are unremarkable. Kidneys are normal, without renal calculi or hydronephrosis. No suspicious renal lesions. Bladder is unremarkable. Stomach/Bowel: Stomach is within normal limits. Appendix appears normal. No evidence of bowel wall thickening, distention, or inflammatory changes. Vascular/Lymphatic: No significant vascular findings are present. No enlarged abdominal or pelvic lymph nodes. Reproductive: Uterus and bilateral  adnexa are unremarkable. Other: Trace free fluid in the pelvis. Musculoskeletal: No acute or significant osseous findings. IMPRESSION: 1. No acute findings in the abdomen or pelvis. 2. Appendix appears normal. 3. Trace free fluid in the pelvis, likely physiologic. Electronically Signed   By: LYetta GlassmanM.D.   On: 11/19/2021 12:33    Procedures Procedures    Medications Ordered in ED Medications  sodium chloride (PF) 0.9 % injection (has no administration in time range)  sodium chloride 0.9 % bolus 1,000 mL (1,000 mLs Intravenous Not Given 11/19/21 1225)  iohexol (OMNIPAQUE) 300 MG/ML solution 100 mL (100 mLs Intravenous Contrast Given 11/19/21 1146)    ED Course/ Medical Decision Making/ A&P                           Medical Decision Making Amount and/or Complexity of Data Reviewed Labs: ordered. Radiology: ordered.   This patient presents to the ED for concern of right lower quadrant pain, this involves an extensive number of treatment options, and is a complaint that carries with it a high risk of complications and  morbidity.  The differential diagnosis includes appendicitis, cholecystitis, ovarian cyst, gastritis, pancreatitis, musculoskeletal injury, others   Co morbidities that complicate the patient evaluation  Asthma, depression, thyroid disease, back pain   Additional history obtained:   External records from outside source obtained and reviewed including urgent care notes and results from this morning showing no urinary tract infection and a negative pregnancy result   Lab Tests:  I Ordered, and personally interpreted labs.  The pertinent results include: WBC 13.8, AST 14, lipase 29, grossly normal urinalysis   Imaging Studies ordered:  I ordered imaging studies including CT abdomen pelvis with contrast I independently visualized and interpreted imaging which showed no acute findings in the abdomen or pelvis with a normal-appearing appendix.  Trace free fluid  in the pelvis, likely physiologic. I agree with the radiologist interpretation    Problem List / ED Course / Critical interventions / Medication management   I have reviewed the patients home medicines and have made adjustments as needed   Test / Admission - Considered:  There is no sign of appendicitis or other acute abdominal abnormality on CT scan.  Lipase within normal limits suggesting no pancreatitis.  No recent physical activity to suggest a musculoskeletal cause.  The patient has no nausea, vomiting, diarrhea to suggest a gastroenteritis.  Unclear as to the underlying cause of the patient's right lower quadrant pain.  The patient states she feels okay at this time and is ready to discharge home.  This is reasonable.  Patient discharged home at this time with return precautions including worsening abdominal pain        Final Clinical Impression(s) / ED Diagnoses Final diagnoses:  Right lower quadrant abdominal pain    Rx / DC Orders ED Discharge Orders     None         Ronny Bacon 11/19/21 1258    Pattricia Boss, MD 11/19/21 1725

## 2021-11-19 NOTE — Discharge Instructions (Signed)
D/C to ED via POV

## 2021-11-19 NOTE — ED Triage Notes (Signed)
Pt reports right lower abdominal pain since yesterday. States the pain is localized to that specific area and thought she may have been cramping but the pain won't go away. Denies n/v/d.  Has tried Pamprin with no relief.

## 2021-11-19 NOTE — Discharge Instructions (Signed)
You were seen today for abdominal pain.  Your work-up was reassuring for no signs of an acute life-threatening cause of your abdominal pain at this time.  If your abdominal pain suddenly worsens, or if you develop other life-threatening condition such as chest pain or shortness of breath, return to the emergency department for reevaluation.  I recommend following up with your primary care provider as needed.

## 2021-11-19 NOTE — ED Triage Notes (Signed)
Pt reports RLQ pain that began yesterday. Pt describes pain as cramping. Denies N/V/D and abnormal vaginal discharge/bleeding.

## 2021-11-19 NOTE — ED Provider Notes (Signed)
Presents to the urgent care with 1 day history of right lower abdominal pain. Patient reports the pain is 2/10 at rest and localized just to the right lower quadrant. Worse with movement. She has been trying Tylenol but pain has persisted. Denies fever, chills, nausea, vomiting/diarrhea, appetite change. Last BM yesterday morning.  On exam she is very tender to palpation in the right lower quadrant, guarding present. Even with light palpation she has severe pain.  +McBurney's sign, +heel tap -Rovsing's She does have a limp, appears uncomfortable with movement.  Urine pregnancy negative, urinalysis trace hgb Afebrile in clinic.  Concern for appendix etiology at this time.  Recommend patient have further evaluation in the emergency department.  Discussed that urgent care does not have the imaging capabilities to fully evaluate this.  Patient discharged to emergency department via personal vehicle.   Eyvonne Burchfield, Wells Guiles, PA-C 11/19/21 0900

## 2021-11-28 ENCOUNTER — Other Ambulatory Visit (INDEPENDENT_AMBULATORY_CARE_PROVIDER_SITE_OTHER): Payer: Self-pay | Admitting: Adult Health

## 2021-11-28 DIAGNOSIS — E8881 Metabolic syndrome: Secondary | ICD-10-CM

## 2021-12-17 ENCOUNTER — Telehealth (INDEPENDENT_AMBULATORY_CARE_PROVIDER_SITE_OTHER): Payer: 59 | Admitting: Adult Health

## 2021-12-17 DIAGNOSIS — Z6833 Body mass index (BMI) 33.0-33.9, adult: Secondary | ICD-10-CM | POA: Diagnosis not present

## 2021-12-17 DIAGNOSIS — E669 Obesity, unspecified: Secondary | ICD-10-CM | POA: Diagnosis not present

## 2021-12-17 DIAGNOSIS — E66813 Obesity, class 3: Secondary | ICD-10-CM

## 2021-12-17 DIAGNOSIS — E8881 Metabolic syndrome: Secondary | ICD-10-CM

## 2021-12-17 DIAGNOSIS — E88819 Insulin resistance, unspecified: Secondary | ICD-10-CM

## 2021-12-17 DIAGNOSIS — F418 Other specified anxiety disorders: Secondary | ICD-10-CM | POA: Diagnosis not present

## 2021-12-17 DIAGNOSIS — Z6841 Body Mass Index (BMI) 40.0 and over, adult: Secondary | ICD-10-CM

## 2021-12-17 MED ORDER — METFORMIN HCL 500 MG PO TABS: ORAL_TABLET | ORAL | 0 refills | Status: DC

## 2021-12-17 MED ORDER — SERTRALINE HCL 50 MG PO TABS: ORAL_TABLET | ORAL | 0 refills | Status: DC

## 2021-12-19 ENCOUNTER — Encounter (INDEPENDENT_AMBULATORY_CARE_PROVIDER_SITE_OTHER): Payer: Self-pay | Admitting: Adult Health

## 2021-12-19 NOTE — Progress Notes (Signed)
TeleHealth Visit:  Due to the COVID-19 pandemic, this visit was completed with telemedicine (audio/video) technology to reduce patient and provider exposure as well as to preserve personal protective equipment.   Sherry Schmidt has verbally consented to this TeleHealth visit. The patient is located at home, the provider is located at the [facility] office. The participants in this ///visit include the listed provider and patient.  Chief Complaint: OBESITY  Sherry Schmidt is here to discuss her progress with her obesity treatment plan along with follow-up of her obesity related diagnoses. Sherry Schmidt is on keeping a food journal and adhering to recommended goals of 1900 calories and 120 protein and states she is following her eating plan approximately 85% of the time. Sherry Schmidt states she is gym 60 minutes 5 times per week.  Today's visit was #: 16 Starting weight: 312 lbs Starting date: 11/10/2019  Interim History:  Sherry Schmidt had abdominal pain 11/19/2021- seen at ED CT Abdominal and Pelvic W/ Contrast IMPRESSION: 1. No acute findings in the abdomen or pelvis. 2. Appendix appears normal. 3. Trace free fluid in the pelvis, likely physiologic.  She denies current GI upset at present.  Subjective:   1. Insulin resistance 12/14/20 Insulin level - 15.8 She is on Metformin '500mg'$  QD- denies GI upset. 2. Depression with anxiety 10/13/2019, started Sertraline 25 mg.   Currently on Sertraline 50 mg, 1.5 tablet =75  mg daily.   She endorses emotional bluntness.   She denies suicidal or homicidal ideations.  She denies sexual side effects.    Assessment/Plan:   1. Insulin resistance Refill - metFORMIN (GLUCOPHAGE) 500 MG tablet; 1 with breakfast  Dispense: 30 tablet; Refill: 0  2. Depression with anxiety Decrease - sertraline (ZOLOFT) 50 MG tablet; Take on tablet daily  Dispense: 90 tablet; Refill: 0  3. Obesity with current BMI of 33.7 Sherry Schmidt is currently in the action  stage of change. As such, her goal is to continue with weight loss efforts. She has agreed to keeping a food journal and adhering to recommended goals of 1900 calories and 120 protein.   Exercise goals:  As is.   Behavioral modification strategies: increasing lean protein intake, decreasing simple carbohydrates, meal planning and cooking strategies, keeping healthy foods in the home, and planning for success.  Sherry Schmidt has agreed to follow-up with our clinic in 4 weeks. She was informed of the importance of frequent follow-up visits to maximize her success with intensive lifestyle modifications for her multiple health conditions.  Objective:   VITALS: Per patient if applicable, see vitals. GENERAL: Alert and in no acute distress. CARDIOPULMONARY: No increased WOB. Speaking in clear sentences.  PSYCH: Pleasant and cooperative. Speech normal rate and rhythm. Affect is appropriate. Insight and judgement are appropriate. Attention is focused, linear, and appropriate.  NEURO: Oriented as arrived to appointment on time with no prompting.   Lab Results  Component Value Date   CREATININE 0.69 11/19/2021   BUN 11 11/19/2021   NA 138 11/19/2021   K 3.8 11/19/2021   CL 109 11/19/2021   CO2 20 (L) 11/19/2021   Lab Results  Component Value Date   ALT 13 11/19/2021   AST 14 (L) 11/19/2021   ALKPHOS 57 11/19/2021   BILITOT 0.6 11/19/2021   Lab Results  Component Value Date   HGBA1C 5.3 12/14/2020   HGBA1C 5.4 03/06/2020   HGBA1C 5.6 11/10/2019   Lab Results  Component Value Date   INSULIN 15.8 12/14/2020   INSULIN 80.9 (H) 03/06/2020  INSULIN 29.3 (H) 11/10/2019   Lab Results  Component Value Date   TSH 2.490 12/14/2020   Lab Results  Component Value Date   CHOL 187 12/14/2020   HDL 46 12/14/2020   LDLCALC 117 (H) 12/14/2020   TRIG 135 12/14/2020   Lab Results  Component Value Date   VD25OH 40.3 12/14/2020   VD25OH 17.5 (L) 03/06/2020   VD25OH 18.8 (L) 11/10/2019   Lab  Results  Component Value Date   WBC 13.8 (H) 11/19/2021   HGB 13.5 11/19/2021   HCT 41.2 11/19/2021   MCV 88.2 11/19/2021   PLT 295 11/19/2021   No results found for: "IRON", "TIBC", "FERRITIN"  Attestation Statements:   Reviewed by clinician on day of visit: allergies, medications, problem list, medical history, surgical history, family history, social history, and previous encounter notes.  I, Davy Pique, RMA, am acting as Location manager for Mina Marble, NP.   I have reviewed the above documentation for accuracy and completeness, and I agree with the above. - Faithlynn Deeley d. Thanos Cousineau, NP-C

## 2021-12-24 ENCOUNTER — Encounter (HOSPITAL_COMMUNITY): Payer: Self-pay | Admitting: Emergency Medicine

## 2021-12-24 ENCOUNTER — Ambulatory Visit (HOSPITAL_COMMUNITY)
Admission: EM | Admit: 2021-12-24 | Discharge: 2021-12-24 | Disposition: A | Discharge status: Home / Self Care | Payer: 59 | Attending: Physician Assistant | Admitting: Physician Assistant

## 2021-12-24 DIAGNOSIS — R051 Acute cough: Secondary | ICD-10-CM

## 2021-12-24 DIAGNOSIS — Z1152 Encounter for screening for COVID-19: Secondary | ICD-10-CM | POA: Diagnosis present

## 2021-12-24 DIAGNOSIS — R112 Nausea with vomiting, unspecified: Secondary | ICD-10-CM

## 2021-12-24 DIAGNOSIS — J069 Acute upper respiratory infection, unspecified: Secondary | ICD-10-CM

## 2021-12-24 DIAGNOSIS — J0100 Acute maxillary sinusitis, unspecified: Secondary | ICD-10-CM

## 2021-12-24 MED ORDER — DM-GUAIFENESIN ER 30-600 MG PO TB12: 1.0000 | ORAL_TABLET | Freq: Two times a day (BID) | ORAL | 0 refills | Status: DC

## 2021-12-24 MED ORDER — ONDANSETRON HCL 4 MG PO TABS: 4.0000 mg | ORAL_TABLET | Freq: Three times a day (TID) | ORAL | 0 refills | Status: DC | PRN

## 2021-12-24 NOTE — ED Triage Notes (Signed)
Pt reports nasal congestion , cough and vomiting the past two days. Denies diarrhea.  Has been taking Tylenol Cold and Flu with some relief.

## 2021-12-24 NOTE — ED Provider Notes (Addendum)
[facility]    CSN: [id number] Arrival date & time: 12/24/21  0805      History   Chief Complaint Chief Complaint  Patient presents with   Cough   Nasal Congestion   Emesis    HPI Sherry Schmidt is a 26 y.o. female.   26 year old female presents with cough, congestion and nausea.  Patient indicates for the past 2 days she has been having persistent cough, chest congestion, with intermittent wheezing and shortness of breath.  Patient indicates that she does have a history of having asthma and she has been using her nebulizer intermittently to help control the wheezing episodes.  Patient indicates that she started yesterday having some nausea and vomiting, stomach cramping that occurred after having some coughing exacerbations.  She also indicates she should have been having some upper respiratory symptoms with some sinus congestion, postnasal drip, rhinitis.  She indicates that most of her production has been clear to yellow.  She has had some intermittent brown production which has been spotty.  She indicates that she has not had any fever, chills.  She relates that she does work at [employer] and is exposed to students on a regular basis, but she has not been around any family or friends have been sick.  She does indicate that she has been vaccinated against COVID.  She denies having any diarrhea with the nausea.  She indicates that most of her stomach cramping feels like.  Type cramps but she has had some pain in the right lower quadrant which has been intermittent with her nausea and vomiting.  She relates she is tolerating fluids well.   Cough Associated symptoms: rhinorrhea, shortness of breath and wheezing   Emesis Associated symptoms: cough     Past Medical History:  Diagnosis Date   Asthma    Back pain    Back pain    Depression    Panic attacks    Thyroid disease     Patient Active Problem List   Diagnosis Date Noted   Pre-diabetes 09/27/2021    Hypothyroidism 05/12/2020   Transaminitis 03/06/2020   Abnormal thyroid blood test 03/06/2020   Mixed hyperlipidemia 03/06/2020   Vitamin D deficiency 02/18/2020   Insulin resistance 02/18/2020   Depression with anxiety 02/18/2020   Class 3 severe obesity with serious comorbidity and body mass index (BMI) of 40.0 to 44.9 in adult [facility]) 02/18/2020   Fatigue 11/06/2016   Postprocedural hypothyroidism 10/16/2016    Past Surgical History:  Procedure Laterality Date   THYROIDECTOMY      OB History     Gravida  0   Para  0   Term  0   Preterm  0   AB  0   Living  0      SAB  0   IAB  0   Ectopic  0   Multiple  0   Live Births  0            Home Medications    Prior to Admission medications   Medication Sig Start Date End Date Taking? Authorizing Provider  dextromethorphan-guaiFENesin (MUCINEX DM) 30-600 MG 12hr tablet Take 1 tablet by mouth 2 (two) times daily. 12/24/21  Yes Nyoka Lint, PA-C  ondansetron (ZOFRAN) 4 MG tablet Take 1 tablet (4 mg total) by mouth every 8 (eight) hours as needed for nausea or vomiting. 12/24/21  Yes Nyoka Lint, PA-C  albuterol (PROVENTIL) (2.5 MG/3ML) 0.083% nebulizer solution Take 3 mLs (2.5 mg  total) by nebulization every 4 (four) hours as needed for wheezing or shortness of breath. 08/07/21   Barrett Henle, MD  albuterol (VENTOLIN HFA) 108 (90 Base) MCG/ACT inhaler Inhale 2 puffs into the lungs every 6 (six) hours as needed for wheezing or shortness of breath. 08/07/21   Barrett Henle, MD  levothyroxine (SYNTHROID) 100 MCG tablet Take 1 tablet (100 mcg total) by mouth daily before breakfast. 09/20/21   Danford, Valetta Fuller D, NP  metFORMIN (GLUCOPHAGE) 500 MG tablet 1 with breakfast 12/17/21   Danford, Valetta Fuller D, NP  methocarbamol (ROBAXIN) 500 MG tablet Take 1 tablet (500 mg total) by mouth 2 (two) times daily. 09/05/21   Lacretia Leigh, MD  oxyCODONE-acetaminophen (PERCOCET/ROXICET) 5-325 MG tablet Take 1-2 tablets by mouth every 6  (six) hours as needed for severe pain. 09/05/21   Lacretia Leigh, MD  sertraline (ZOLOFT) 50 MG tablet Take on tablet daily 12/17/21   Danford, Valetta Fuller D, NP  Vitamin D, Ergocalciferol, (DRISDOL) 1.25 MG (50000 UNIT) CAPS capsule Take 1 capsule (50,000 Units total) by mouth every 3 (three) days. 08/14/20   Abby Potash, PA-C    Family History Family History  Problem Relation Age of Onset   Hypertension Mother    Depression Mother    Bipolar disorder Mother    Drug abuse Mother    Obesity Mother    Diabetes Father    Thyroid disease Father    Cancer Father    Depression Father    Anxiety disorder Father    Bipolar disorder Father    Schizophrenia Father    Liver disease Father    Sleep apnea Father    Alcohol abuse Father    Drug abuse Father    Obesity Father     Social History Social History   Tobacco Use   Smoking status: Never   Smokeless tobacco: Never  Vaping Use   Vaping Use: Never used  Substance Use Topics   Alcohol use: Yes    Comment: occasional   Drug use: No     Allergies   Patient has no known allergies.   Review of Systems Review of Systems  HENT:  Positive for postnasal drip, rhinorrhea and sinus pressure.   Respiratory:  Positive for cough, shortness of breath and wheezing.   Gastrointestinal:  Positive for vomiting.     Physical Exam Triage Vital Signs ED Triage Vitals  Enc Vitals Group     BP 12/24/21 0830 112/70     Pulse Rate 12/24/21 0830 68     Resp 12/24/21 0830 17     Temp 12/24/21 0830 97.9 F (36.6 C)     Temp Source 12/24/21 0830 Oral     SpO2 12/24/21 0830 100 %     Weight --      Height --      Head Circumference --      Peak Flow --      Pain Score 12/24/21 0829 0     Pain Loc --      Pain Edu? --      Excl. in [place]? --    No data found.  Updated Vital Signs BP 112/70 (BP Location: Left Arm)   Pulse 68   Temp 97.9 F (36.6 C) (Oral)   Resp 17   SpO2 100%   Visual Acuity Right Eye Distance:   Left Eye  Distance:   Bilateral Distance:    Right Eye Near:   Left Eye Near:    Bilateral  Near:     Physical Exam Constitutional:      Appearance: Normal appearance.  HENT:     Right Ear: Tympanic membrane and ear canal normal.     Left Ear: Tympanic membrane and ear canal normal.     Mouth/Throat:     Mouth: Mucous membranes are moist.     Pharynx: Oropharynx is clear. No posterior oropharyngeal erythema.  Cardiovascular:     Rate and Rhythm: Normal rate and regular rhythm.     Heart sounds: Normal heart sounds.  Pulmonary:     Effort: Pulmonary effort is normal.     Breath sounds: Normal breath sounds and air entry. No wheezing, rhonchi or rales.  Abdominal:     General: Abdomen is flat. Bowel sounds are normal.     Palpations: Abdomen is soft.     Tenderness: There is generalized abdominal tenderness. There is no guarding or rebound.  Lymphadenopathy:     Cervical: No cervical adenopathy.  Neurological:     Mental Status: She is alert.      UC Treatments / Results  Labs (all labs ordered are listed, but only abnormal results are displayed) Labs Reviewed  SARS CORONAVIRUS 2 (TAT 6-24 HRS)    EKG   Radiology No results found.  Procedures Procedures (including critical care time)  Medications Ordered in UC Medications - No data to display  Initial Impression / Assessment and Plan / UC Course  I have reviewed the triage vital signs and the nursing notes.  Pertinent labs & imaging results that were available during my care of the patient were reviewed by me and considered in my medical decision making (see chart for details).       Plan: 1.  This is an acute uncomplicated problem of acute URI and sinusitis.  Treatment will be the following: A.  Mucinex DM every 12 hours for cough and congestion. B.  Zofran every 6 hours to control the stomach cramping and nausea. C.  Advised patient to continue using home nebulizer on a regular basis to help control cough,  congestion and wheezing. 2.  COVID test is pending.  (Ordered due to symptoms of cough, congestion, possible exposure, GI symptoms.) 3.  Advised to follow-up with PCP or return to urgent care if symptoms fail to improve. Final Clinical Impressions(s) / UC Diagnoses   Final diagnoses:  Viral upper respiratory tract infection  Acute cough  Nausea and vomiting, unspecified vomiting type  Acute non-recurrent maxillary sinusitis  Encounter for screening for COVID-19     Discharge Instructions      This is more of an upper respiratory sinus viral type infection that causes the congestion and cough. Advised to use the albuterol nebulizer on a regular basis to help decrease the cough, congestion, and shortness of breath. Advised to take the Mucinex DM every 12 hours to help control the cough. Advised to take the Zofran every 6 hours to help control the stomach cramping and pain. COVID test will be completed in 24 hours or less.  You do not get a call from this office in 24 hours that indicates the test is negative.  You can go on MyChart to view the results when it post in 24 to 48 hours. Advised to follow-up with PCP or return to urgent care if the abdominal pain continues, worsens and is followed with fever.     ED Prescriptions     Medication Sig Dispense Auth. Provider   ondansetron (ZOFRAN) 4 MG tablet  Take 1 tablet (4 mg total) by mouth every 8 (eight) hours as needed for nausea or vomiting. 20 tablet Nyoka Lint, PA-C   dextromethorphan-guaiFENesin [facility] DM) 30-600 MG 12hr tablet Take 1 tablet by mouth 2 (two) times daily. 14 tablet Nyoka Lint, PA-C      PDMP not reviewed this encounter.   Nyoka Lint, PA-C 12/24/21 0856    Nyoka Lint, PA-C 12/24/21 [phone]

## 2021-12-24 NOTE — Discharge Instructions (Signed)
This is more of an upper respiratory sinus viral type infection that causes the congestion and cough. Advised to use the albuterol nebulizer on a regular basis to help decrease the cough, congestion, and shortness of breath. Advised to take the Mucinex DM every 12 hours to help control the cough. Advised to take the Zofran every 6 hours to help control the stomach cramping and pain. COVID test will be completed in 24 hours or less.  You do not get a call from this office in 24 hours that indicates the test is negative.  You can go on MyChart to view the results when it post in 24 to 48 hours. Advised to follow-up with PCP or return to urgent care if the abdominal pain continues, worsens and is followed with fever.

## 2021-12-25 LAB — SARS CORONAVIRUS 2 (TAT 6-24 HRS): SARS Coronavirus 2: NEGATIVE

## 2022-01-06 ENCOUNTER — Other Ambulatory Visit (INDEPENDENT_AMBULATORY_CARE_PROVIDER_SITE_OTHER): Payer: Self-pay | Admitting: Adult Health

## 2022-01-06 DIAGNOSIS — E88819 Insulin resistance, unspecified: Secondary | ICD-10-CM

## 2022-01-25 ENCOUNTER — Encounter (HOSPITAL_COMMUNITY): Payer: Self-pay

## 2022-01-25 ENCOUNTER — Ambulatory Visit (HOSPITAL_COMMUNITY)
Admission: EM | Admit: 2022-01-25 | Discharge: 2022-01-25 | Disposition: A | Discharge status: Home / Self Care | Payer: 59 | Attending: Nurse Practitioner | Admitting: Nurse Practitioner

## 2022-01-25 DIAGNOSIS — A059 Bacterial foodborne intoxication, unspecified: Secondary | ICD-10-CM | POA: Diagnosis not present

## 2022-01-25 DIAGNOSIS — R112 Nausea with vomiting, unspecified: Secondary | ICD-10-CM

## 2022-01-25 MED ORDER — ONDANSETRON 8 MG PO TBDP: 8.0000 mg | ORAL_TABLET | Freq: Two times a day (BID) | ORAL | 0 refills | Status: AC

## 2022-01-25 NOTE — ED Triage Notes (Signed)
Patient having abdominal pain and vomiting onset this morning. No new foods or meds, no one around the Patient with similar symptoms.   No medical history of stomach problems. Threw up 3 times this morning.

## 2022-01-25 NOTE — ED Provider Notes (Signed)
[facility]    CSN: [id number] Arrival date & time: 01/25/22  0801      History   Chief Complaint Chief Complaint  Patient presents with   Emesis   Abdominal Pain    HPI Sherry Schmidt is a 26 y.o. female.   HPI  She is complaining of abdominal pain and vomiting that started this morning. She is having 2/10 abdominal pain in the right upper area. She has also had diarrhea.; watery stools. She reports that she did eat shrimp for dinner on last night. She was fine until this morning. She had similar symptoms on 12/24/2021. She was diagnosed with URI. She was treated with ondansetron which was effective. Denies fever, chills, nasal congestion, sneezing, runny nose, cough, new sore throat,  loss of smell or taste, shortness of breath, chest pain, Denies any exposures to viruses,COVID/Influenza/Strep. The current treatment has been ginger ale. Past Medical History:  Diagnosis Date   Asthma    Back pain    Back pain    Depression    Panic attacks    Thyroid disease     Patient Active Problem List   Diagnosis Date Noted   Pre-diabetes 09/27/2021   Hypothyroidism 05/12/2020   Transaminitis 03/06/2020   Abnormal thyroid blood test 03/06/2020   Mixed hyperlipidemia 03/06/2020   Vitamin D deficiency 02/18/2020   Insulin resistance 02/18/2020   Depression with anxiety 02/18/2020   Class 3 severe obesity with serious comorbidity and body mass index (BMI) of 40.0 to 44.9 in adult [facility]) 02/18/2020   Fatigue 11/06/2016   Postprocedural hypothyroidism 10/16/2016    Past Surgical History:  Procedure Laterality Date   THYROIDECTOMY      OB History     Gravida  0   Para  0   Term  0   Preterm  0   AB  0   Living  0      SAB  0   IAB  0   Ectopic  0   Multiple  0   Live Births  0            Home Medications    Prior to Admission medications   Medication Sig Start Date End Date Taking? Authorizing Provider  levothyroxine (SYNTHROID)  100 MCG tablet Take 1 tablet (100 mcg total) by mouth daily before breakfast. 09/20/21  Yes Danford, Valetta Fuller D, NP  metFORMIN (GLUCOPHAGE) 500 MG tablet 1 with breakfast 12/17/21  Yes Danford, Valetta Fuller D, NP  methocarbamol (ROBAXIN) 500 MG tablet Take 1 tablet (500 mg total) by mouth 2 (two) times daily. 09/05/21  Yes Lacretia Leigh, MD  ondansetron (ZOFRAN-ODT) 8 MG disintegrating tablet Take 1 tablet (8 mg total) by mouth 2 (two) times daily for 10 days. 01/25/22 02/04/22 Yes Vevelyn Francois, NP  sertraline (ZOLOFT) 50 MG tablet Take on tablet daily 12/17/21  Yes Danford, Valetta Fuller D, NP  Vitamin D, Ergocalciferol, (DRISDOL) 1.25 MG (50000 UNIT) CAPS capsule Take 1 capsule (50,000 Units total) by mouth every 3 (three) days. 08/14/20  Yes Abby Potash, PA-C  albuterol (PROVENTIL) (2.5 MG/3ML) 0.083% nebulizer solution Take 3 mLs (2.5 mg total) by nebulization every 4 (four) hours as needed for wheezing or shortness of breath. 08/07/21   Barrett Henle, MD  albuterol (VENTOLIN HFA) 108 (90 Base) MCG/ACT inhaler Inhale 2 puffs into the lungs every 6 (six) hours as needed for wheezing or shortness of breath. 08/07/21   Barrett Henle, MD  dextromethorphan-guaiFENesin [facility] DM) 30-600 MG  12hr tablet Take 1 tablet by mouth 2 (two) times daily. 12/24/21   Nyoka Lint, PA-C  oxyCODONE-acetaminophen (PERCOCET/ROXICET) 5-325 MG tablet Take 1-2 tablets by mouth every 6 (six) hours as needed for severe pain. 09/05/21   Lacretia Leigh, MD    Family History Family History  Problem Relation Age of Onset   Hypertension Mother    Depression Mother    Bipolar disorder Mother    Drug abuse Mother    Obesity Mother    Diabetes Father    Thyroid disease Father    Cancer Father    Depression Father    Anxiety disorder Father    Bipolar disorder Father    Schizophrenia Father    Liver disease Father    Sleep apnea Father    Alcohol abuse Father    Drug abuse Father    Obesity Father     Social History Social  History   Tobacco Use   Smoking status: Never   Smokeless tobacco: Never  Vaping Use   Vaping Use: Never used  Substance Use Topics   Alcohol use: Yes    Comment: occasional   Drug use: No     Allergies   Patient has no known allergies.   Review of Systems Review of Systems   Physical Exam Triage Vital Signs ED Triage Vitals  Enc Vitals Group     BP 01/25/22 0815 121/72     Pulse Rate 01/25/22 0815 61     Resp 01/25/22 0815 16     Temp 01/25/22 0815 98 F (36.7 C)     Temp Source 01/25/22 0815 Oral     SpO2 01/25/22 0815 99 %     Weight --      Height --      Head Circumference --      Peak Flow --      Pain Score 01/25/22 0813 2     Pain Loc --      Pain Edu? --      Excl. in [place]? --    No data found.  Updated Vital Signs BP 121/72 (BP Location: Left Arm)   Pulse 61   Temp 98 F (36.7 C) (Oral)   Resp 16   LMP 01/13/2022 (Exact Date)   SpO2 99%   Visual Acuity Right Eye Distance:   Left Eye Distance:   Bilateral Distance:    Right Eye Near:   Left Eye Near:    Bilateral Near:     Physical Exam Constitutional:      General: She is not in acute distress.    Appearance: She is not ill-appearing, toxic-appearing or diaphoretic.  HENT:     Head: Normocephalic and atraumatic.     Mouth/Throat:     Mouth: Mucous membranes are moist.  Eyes:     Pupils: Pupils are equal, round, and reactive to light.  Cardiovascular:     Heart sounds: Normal heart sounds.  Pulmonary:     Effort: Pulmonary effort is normal.     Breath sounds: Normal breath sounds.  Abdominal:     General: Bowel sounds are decreased. There is no distension or abdominal bruit. There are no signs of injury.     Palpations: Abdomen is soft.     Tenderness: There is generalized abdominal tenderness and tenderness in the right upper quadrant. There is guarding. There is no right CVA tenderness or left CVA tenderness.     Hernia: No hernia is present.  Skin:  General: Skin is warm  and dry.     Capillary Refill: Capillary refill takes less than 2 seconds.     Comments: Skin tag to left upper abdomen  Neurological:     General: No focal deficit present.     Mental Status: She is alert.  Psychiatric:        Mood and Affect: Mood normal.        Behavior: Behavior normal.      UC Treatments / Results  Labs (all labs ordered are listed, but only abnormal results are displayed) Labs Reviewed - No data to display  EKG   Radiology No results found.  Procedures Procedures (including critical care time)  Medications Ordered in UC Medications - No data to display  Initial Impression / Assessment and Plan / UC Course  I have reviewed the triage vital signs and the nursing notes.  Pertinent labs & imaging results that were available during my care of the patient were reviewed by me and considered in my medical decision making (see chart for details).     vomiting Final Clinical Impressions(s) / UC Diagnoses   Final diagnoses:  Nausea vomiting and diarrhea  Food poisoning  Declined Imaging of gallbladder   Discharge Instructions      You have been diagnosed with possible food poising related to seafood. You have been prescribed Ondansetron.Please take as directed.  Advance diet slowly bananas, rice, applesauce and toast. If symptoms persist and unable to tolerate food or liquid to follow up for further evaluation with imaging and blood testing.     ED Prescriptions     Medication Sig Dispense Auth. Provider   ondansetron (ZOFRAN-ODT) 8 MG disintegrating tablet Take 1 tablet (8 mg total) by mouth 2 (two) times daily for 10 days. 20 tablet Vevelyn Francois, NP      PDMP not reviewed this encounter.   Dionisio David [place], [place] 01/25/22 [phone]

## 2022-01-25 NOTE — Discharge Instructions (Addendum)
You have been diagnosed with possible food poising related to seafood. You have been prescribed Ondansetron.Please take as directed.  Advance diet slowly bananas, rice, applesauce and toast. If symptoms persist and unable to tolerate food or liquid to follow up for further evaluation with imaging and blood testing.

## 2022-02-12 ENCOUNTER — Ambulatory Visit (INDEPENDENT_AMBULATORY_CARE_PROVIDER_SITE_OTHER): Payer: 59 | Admitting: Family Medicine

## 2022-02-13 ENCOUNTER — Ambulatory Visit (INDEPENDENT_AMBULATORY_CARE_PROVIDER_SITE_OTHER): Payer: 59 | Admitting: Family Medicine

## 2022-02-25 ENCOUNTER — Other Ambulatory Visit (INDEPENDENT_AMBULATORY_CARE_PROVIDER_SITE_OTHER): Payer: Self-pay | Admitting: Adult Health

## 2022-02-25 DIAGNOSIS — F418 Other specified anxiety disorders: Secondary | ICD-10-CM

## 2022-02-25 DIAGNOSIS — E88819 Insulin resistance, unspecified: Secondary | ICD-10-CM

## 2022-02-25 DIAGNOSIS — E038 Other specified hypothyroidism: Secondary | ICD-10-CM

## 2022-02-26 ENCOUNTER — Ambulatory Visit (HOSPITAL_COMMUNITY)
Admission: EM | Admit: 2022-02-26 | Discharge: 2022-02-26 | Disposition: A | Discharge status: Home / Self Care | Payer: 59 | Attending: Emergency Medicine | Admitting: Emergency Medicine

## 2022-02-26 ENCOUNTER — Encounter (HOSPITAL_COMMUNITY): Payer: Self-pay | Admitting: *Deleted

## 2022-02-26 DIAGNOSIS — J4520 Mild intermittent asthma, uncomplicated: Secondary | ICD-10-CM

## 2022-02-26 DIAGNOSIS — R0602 Shortness of breath: Secondary | ICD-10-CM

## 2022-02-26 MED ORDER — ALBUTEROL SULFATE HFA 108 (90 BASE) MCG/ACT IN AERS
2.0000 | INHALATION_SPRAY | Freq: Once | RESPIRATORY_TRACT | Status: AC
  Administered 2022-02-26: 2 via RESPIRATORY_TRACT

## 2022-02-26 MED ORDER — ALBUTEROL SULFATE HFA 108 (90 BASE) MCG/ACT IN AERS: 2.0000 | INHALATION_SPRAY | Freq: Four times a day (QID) | RESPIRATORY_TRACT | 0 refills | Status: DC | PRN

## 2022-02-26 MED ORDER — ALBUTEROL SULFATE HFA 108 (90 BASE) MCG/ACT IN AERS
INHALATION_SPRAY | RESPIRATORY_TRACT | Status: AC
  Filled 2022-02-26: qty 6.7

## 2022-02-26 MED ORDER — ALBUTEROL SULFATE (2.5 MG/3ML) 0.083% IN NEBU: 2.5000 mg | INHALATION_SOLUTION | RESPIRATORY_TRACT | 0 refills | Status: DC | PRN

## 2022-02-26 NOTE — ED Triage Notes (Signed)
Pt states that she is asthmatic and since last night she has been SOB, coughing a little. She said when she takes a deep breath there is a shooting pain in her chest. She did try to use her albuterol MDI but didn't get much relief. She thinks it is expired.

## 2022-02-26 NOTE — ED Provider Notes (Signed)
[place]    CSN: [id number] Arrival date & time: 02/26/22  0813      History   Chief Complaint Chief Complaint  Patient presents with   Cough   Shortness of Breath    HPI Sherry Schmidt is a 26 y.o. female.  Presents with possible asthma exacerbation, started last night Reports chest discomfort with deep breath, feels short of breath History of asthma, tried inhaler at home but she thinks it is expired Denies any fevers, congestion, cough, recent illness, sick contacts  Past Medical History:  Diagnosis Date   Asthma    Back pain    Back pain    Depression    Panic attacks    Thyroid disease     Patient Active Problem List   Diagnosis Date Noted   Pre-diabetes 09/27/2021   Hypothyroidism 05/12/2020   Transaminitis 03/06/2020   Abnormal thyroid blood test 03/06/2020   Mixed hyperlipidemia 03/06/2020   Vitamin D deficiency 02/18/2020   Insulin resistance 02/18/2020   Depression with anxiety 02/18/2020   Class 3 severe obesity with serious comorbidity and body mass index (BMI) of 40.0 to 44.9 in adult ([place]) 02/18/2020   Fatigue 11/06/2016   Postprocedural hypothyroidism 10/16/2016    Past Surgical History:  Procedure Laterality Date   THYROIDECTOMY      OB History     Gravida  0   Para  0   Term  0   Preterm  0   AB  0   Living  0      SAB  0   IAB  0   Ectopic  0   Multiple  0   Live Births  0            Home Medications    Prior to Admission medications   Medication Sig Start Date End Date Taking? Authorizing Provider  levothyroxine (SYNTHROID) 100 MCG tablet Take 1 tablet (100 mcg total) by mouth daily before breakfast. 09/20/21  Yes Danford, Valetta Fuller D, NP  metFORMIN (GLUCOPHAGE) 500 MG tablet 1 with breakfast 12/17/21  Yes Danford, Katy D, NP  sertraline (ZOLOFT) 50 MG tablet Take on tablet daily 12/17/21  Yes Danford, Katy D, NP  Vitamin D, Ergocalciferol, (DRISDOL) 1.25 MG (50000 UNIT) CAPS capsule Take 1  capsule (50,000 Units total) by mouth every 3 (three) days. 08/14/20  Yes Abby Potash, PA-C  albuterol (PROVENTIL) (2.5 MG/3ML) 0.083% nebulizer solution Take 3 mLs (2.5 mg total) by nebulization every 4 (four) hours as needed for wheezing or shortness of breath. 02/26/22   Aarin Bluett, Wells Guiles, PA-C  albuterol (VENTOLIN HFA) 108 (90 Base) MCG/ACT inhaler Inhale 2 puffs into the lungs every 6 (six) hours as needed for wheezing or shortness of breath. 02/26/22   Aleisa Howk, Wells Guiles, PA-C    Family History Family History  Problem Relation Age of Onset   Hypertension Mother    Depression Mother    Bipolar disorder Mother    Drug abuse Mother    Obesity Mother    Diabetes Father    Thyroid disease Father    Cancer Father    Depression Father    Anxiety disorder Father    Bipolar disorder Father    Schizophrenia Father    Liver disease Father    Sleep apnea Father    Alcohol abuse Father    Drug abuse Father    Obesity Father     Social History Social History   Tobacco Use   Smoking status: Never  Smokeless tobacco: Never  Vaping Use   Vaping Use: Never used  Substance Use Topics   Alcohol use: Yes    Comment: occasional   Drug use: No     Allergies   Patient has no known allergies.   Review of Systems Review of Systems  As per HPI  Physical Exam Triage Vital Signs ED Triage Vitals  Enc Vitals Group     BP      Pulse      Resp      Temp      Temp src      SpO2      Weight      Height      Head Circumference      Peak Flow      Pain Score      Pain Loc      Pain Edu?      Excl. in [place]?    No data found.  Updated Vital Signs BP 124/77 (BP Location: Left Arm)   Pulse 63   Temp 97.9 F (36.6 C) (Oral)   Resp 18   LMP 01/18/2022   SpO2 100%      Physical Exam Vitals and nursing note reviewed.  Constitutional:      General: She is not in acute distress.    Appearance: Normal appearance.     Comments: Well-appearing, in no acute distress, speaking  in full sentences without respiratory distress  HENT:     Nose: No congestion.     Mouth/Throat:     Mouth: Mucous membranes are moist.     Pharynx: Oropharynx is clear. No posterior oropharyngeal erythema.  Eyes:     Conjunctiva/sclera: Conjunctivae normal.  Cardiovascular:     Rate and Rhythm: Normal rate and regular rhythm.     Pulses: Normal pulses.     Heart sounds: Normal heart sounds.  Pulmonary:     Effort: Pulmonary effort is normal. No respiratory distress.     Breath sounds: Normal breath sounds. No wheezing.     Comments: Lungs are clear throughout without wheezing Lymphadenopathy:     Cervical: No cervical adenopathy.  Skin:    General: Skin is warm and dry.  Neurological:     Mental Status: She is alert and oriented to person, place, and time.     UC Treatments / Results  Labs (all labs ordered are listed, but only abnormal results are displayed) Labs Reviewed - No data to display  EKG   Radiology No results found.  Procedures Procedures (including critical care time)  Medications Ordered in UC Medications  albuterol (VENTOLIN HFA) 108 (90 Base) MCG/ACT inhaler 2 puff (2 puffs Inhalation Given 02/26/22 0901)    Initial Impression / Assessment and Plan / UC Course  I have reviewed the triage vital signs and the nursing notes.  Pertinent labs & imaging results that were available during my care of the patient were reviewed by me and considered in my medical decision making (see chart for details).  She is very well-appearing without signs of asthma exacerbation or respiratory distress. Oxygenating at 100% on room air. Offered albuterol inhaler 2 puffs with improvement in symptoms.  Patient reports she is feeling much better. Filled inhaler and nebulizer medicine to use at home Do not believe steroid course is warranted at this time. Added to PCP assistance list.  Instructed to establish and follow-up regarding asthma management as needed.  Can return  to urgent care with any concerns.  ED  precautions discussed.  Patient agrees to plan  Final Clinical Impressions(s) / UC Diagnoses   Final diagnoses:  Mild intermittent asthma without complication  Shortness of breath     Discharge Instructions      I have refilled your albuterol inhaler and nebulizer medicine. You can use every 6 hours as needed for shortness of breath, wheezing, chest discomfort.  Please establish and follow-up with your primary care provider regarding your asthma management. If you develop worsening symptoms please go to the emergency department.     ED Prescriptions     Medication Sig Dispense Auth. Provider   albuterol (PROVENTIL) (2.5 MG/3ML) 0.083% nebulizer solution Take 3 mLs (2.5 mg total) by nebulization every 4 (four) hours as needed for wheezing or shortness of breath. 75 mL Analysa Nutting, PA-C   albuterol (VENTOLIN HFA) 108 (90 Base) MCG/ACT inhaler Inhale 2 puffs into the lungs every 6 (six) hours as needed for wheezing or shortness of breath. 1 each Cledith Abdou, Wells Guiles, PA-C      PDMP not reviewed this encounter.   Lauralee Waters, Vernice Jefferson 02/26/22 [id number]

## 2022-02-26 NOTE — Discharge Instructions (Addendum)
I have refilled your albuterol inhaler and nebulizer medicine. You can use every 6 hours as needed for shortness of breath, wheezing, chest discomfort.  Please establish and follow-up with your primary care provider regarding your asthma management. If you develop worsening symptoms please go to the emergency department.

## 2022-02-28 ENCOUNTER — Encounter (HOSPITAL_COMMUNITY): Payer: Self-pay

## 2022-03-07 ENCOUNTER — Ambulatory Visit (INDEPENDENT_AMBULATORY_CARE_PROVIDER_SITE_OTHER): Payer: 59 | Admitting: Family Medicine

## 2022-03-07 ENCOUNTER — Encounter (INDEPENDENT_AMBULATORY_CARE_PROVIDER_SITE_OTHER): Payer: Self-pay | Admitting: Family Medicine

## 2022-03-07 VITALS — BP 135/70 | HR 50 | Temp 98.4°F | Ht 73.0 in | Wt 250.0 lb

## 2022-03-07 DIAGNOSIS — Z6833 Body mass index (BMI) 33.0-33.9, adult: Secondary | ICD-10-CM

## 2022-03-07 DIAGNOSIS — E669 Obesity, unspecified: Secondary | ICD-10-CM

## 2022-03-07 DIAGNOSIS — F418 Other specified anxiety disorders: Secondary | ICD-10-CM | POA: Diagnosis not present

## 2022-03-07 DIAGNOSIS — E88819 Insulin resistance, unspecified: Secondary | ICD-10-CM | POA: Diagnosis not present

## 2022-03-07 DIAGNOSIS — E038 Other specified hypothyroidism: Secondary | ICD-10-CM | POA: Diagnosis not present

## 2022-03-07 MED ORDER — METFORMIN HCL 500 MG PO TABS: ORAL_TABLET | ORAL | 0 refills | Status: DC

## 2022-03-07 MED ORDER — LEVOTHYROXINE SODIUM 100 MCG PO TABS: 100.0000 ug | ORAL_TABLET | Freq: Every day | ORAL | 0 refills | Status: DC

## 2022-03-07 MED ORDER — SERTRALINE HCL 50 MG PO TABS: ORAL_TABLET | ORAL | 0 refills | Status: DC

## 2022-03-18 NOTE — Progress Notes (Unsigned)
Chief Complaint:   OBESITY Sherry Schmidt is here to discuss her progress with her obesity treatment plan along with follow-up of her obesity related diagnoses. Sherry Schmidt is on keeping a food journal and adhering to recommended goals of 1900 calories and 120 grams of protein and states she is following her eating plan approximately 40% of the time. Hayes states she is at the gym for 60 minutes 5 times per week.  Today's visit was #: 54 Starting weight: 312 lbs Starting date: 11/10/2019 Today's weight: 250 lbs Today's date: 03/07/2022 Total lbs lost to date: 3 Total lbs lost since last in-office visit: 6  Interim History: Sherry Schmidt has been off track for the last couple of months, but she has done well with avoiding weight gain and even losing a bit more.  She is doing well with making strategies to eat healthier over Christmas.  Subjective:   1. Insulin resistance Sherry Schmidt has been off metformin for 1 month, and she is working on her diet and exercise.  2. Other specified hypothyroidism Sherry Schmidt has been out of her medications for 1 month, and she needs to get back on.  3. Depression with anxiety Sherry Schmidt's mood is stable on her medications, and she is doing well with decreasing emotional eating behaviors.  Assessment/Plan:   1. Insulin resistance Sherry Schmidt will continue metformin 500 mg once daily, and we will refill for 1 month.  We will recheck labs in 2 months.  - metFORMIN (GLUCOPHAGE) 500 MG tablet; 1 with breakfast  Dispense: 30 tablet; Refill: 0  2. Other specified hypothyroidism Sherry Schmidt will continue Synthroid 100 mcg once daily, and we will refill for 1 month.  We will recheck labs in 2 months.  - levothyroxine (SYNTHROID) 100 MCG tablet; Take 1 tablet (100 mcg total) by mouth daily before breakfast.  Dispense: 30 tablet; Refill: 0  3. Depression with anxiety Sherry Schmidt will continue Zoloft 50 mg once daily, and we will refill for 1 month.  - sertraline  (ZOLOFT) 50 MG tablet; Take on tablet daily  Dispense: 30 tablet; Refill: 0  4. Obesity, Current BMI of 33.1 Sherry Schmidt is currently in the action stage of change. As such, her goal is to continue with weight loss efforts. She has agreed to keeping a food journal and adhering to recommended goals of 1600-1900 calories and 120 grams of protein daily.   Exercise goals: As is.   Behavioral modification strategies: no skipping meals and holiday eating strategies .  Sherry Schmidt has agreed to follow-up with our clinic in 4 weeks. She was informed of the importance of frequent follow-up visits to maximize her success with intensive lifestyle modifications for her multiple health conditions.   Objective:   Blood pressure 135/70, pulse (!) 50, temperature 98.4 F (36.9 C), height '6\' 1"'$  (1.854 m), weight 250 lb (113.4 kg), last menstrual period 01/18/2022, SpO2 99 %. Body mass index is 32.98 kg/m.  General: Cooperative, alert, well developed, in no acute distress. HEENT: Conjunctivae and lids unremarkable. Cardiovascular: Regular rhythm.  Lungs: Normal work of breathing. Neurologic: No focal deficits.   Lab Results  Component Value Date   CREATININE 0.69 11/19/2021   BUN 11 11/19/2021   NA 138 11/19/2021   K 3.8 11/19/2021   CL 109 11/19/2021   CO2 20 (L) 11/19/2021   Lab Results  Component Value Date   ALT 13 11/19/2021   AST 14 (L) 11/19/2021   ALKPHOS 57 11/19/2021   BILITOT 0.6 11/19/2021   Lab Results  Component Value  Date   HGBA1C 5.3 12/14/2020   HGBA1C 5.4 03/06/2020   HGBA1C 5.6 11/10/2019   Lab Results  Component Value Date   INSULIN 15.8 12/14/2020   INSULIN 80.9 (H) 03/06/2020   INSULIN 29.3 (H) 11/10/2019   Lab Results  Component Value Date   TSH 2.490 12/14/2020   Lab Results  Component Value Date   CHOL 187 12/14/2020   HDL 46 12/14/2020   LDLCALC 117 (H) 12/14/2020   TRIG 135 12/14/2020   Lab Results  Component Value Date   VD25OH 40.3 12/14/2020    VD25OH 17.5 (L) 03/06/2020   VD25OH 18.8 (L) 11/10/2019   Lab Results  Component Value Date   WBC 13.8 (H) 11/19/2021   HGB 13.5 11/19/2021   HCT 41.2 11/19/2021   MCV 88.2 11/19/2021   PLT 295 11/19/2021   No results found for: "IRON", "TIBC", "FERRITIN"  Attestation Statements:   Reviewed by clinician on day of visit: allergies, medications, problem list, medical history, surgical history, family history, social history, and previous encounter notes.   I, Trixie Dredge, am acting as transcriptionist for Dennard Nip, MD.  I have reviewed the above documentation for accuracy and completeness, and I agree with the above. -  Dennard Nip, MD

## 2022-03-28 ENCOUNTER — Ambulatory Visit (INDEPENDENT_AMBULATORY_CARE_PROVIDER_SITE_OTHER): Payer: 59 | Admitting: Physician Assistant

## 2022-04-04 ENCOUNTER — Ambulatory Visit (INDEPENDENT_AMBULATORY_CARE_PROVIDER_SITE_OTHER): Payer: 59 | Admitting: Physician Assistant

## 2022-04-10 ENCOUNTER — Ambulatory Visit (HOSPITAL_COMMUNITY)
Admission: EM | Admit: 2022-04-10 | Discharge: 2022-04-10 | Disposition: A | Discharge status: Home / Self Care | Payer: 59 | Attending: Nurse Practitioner | Admitting: Nurse Practitioner

## 2022-04-10 ENCOUNTER — Encounter (HOSPITAL_COMMUNITY): Payer: Self-pay

## 2022-04-10 DIAGNOSIS — J069 Acute upper respiratory infection, unspecified: Secondary | ICD-10-CM | POA: Diagnosis not present

## 2022-04-10 MED ORDER — BENZONATATE 200 MG PO CAPS: 200.0000 mg | ORAL_CAPSULE | Freq: Three times a day (TID) | ORAL | 0 refills | Status: DC | PRN

## 2022-04-10 NOTE — ED Provider Notes (Signed)
[place]    CSN: [id number] Arrival date & time: 04/10/22  0801      History   Chief Complaint Chief Complaint  Patient presents with   Cough   Emesis    HPI Sherry Schmidt is a 27 y.o. female who presents for evaluation of URI symptoms for 1 days. Patient reports associated symptoms of cough with 1 episode of posttussive vomiting, sore throat, congestion. Denies N/V/D, fevers, body aches, ear pain or SOB. Patient does have a hx of asthma. Has albuterol inhaler but has not had to use since sx onset. No smoking. No known sick contacts and no recent travel. Pt is vaccinated for COVID. Pt is not vaccinated for flu this season. Pt has taken cough/sinus medications OTC for symptoms. Pt has no other concerns at this time.    Cough Associated symptoms: sore throat   Emesis Associated symptoms: cough and sore throat     Past Medical History:  Diagnosis Date   Asthma    Back pain    Back pain    Depression    Panic attacks    Thyroid disease     Patient Active Problem List   Diagnosis Date Noted   Pre-diabetes 09/27/2021   Hypothyroidism 05/12/2020   Transaminitis 03/06/2020   Abnormal thyroid blood test 03/06/2020   Mixed hyperlipidemia 03/06/2020   Vitamin D deficiency 02/18/2020   Insulin resistance 02/18/2020   Depression with anxiety 02/18/2020   Class 3 severe obesity with serious comorbidity and body mass index (BMI) of 40.0 to 44.9 in adult [facility]) 02/18/2020   Fatigue 11/06/2016   Postprocedural hypothyroidism 10/16/2016    Past Surgical History:  Procedure Laterality Date   THYROIDECTOMY      OB History     Gravida  0   Para  0   Term  0   Preterm  0   AB  0   Living  0      SAB  0   IAB  0   Ectopic  0   Multiple  0   Live Births  0            Home Medications    Prior to Admission medications   Medication Sig Start Date End Date Taking? Authorizing Provider  benzonatate (TESSALON) 200 MG capsule Take 1  capsule (200 mg total) by mouth 3 (three) times daily as needed for cough. 04/10/22  Yes Melynda Ripple, NP  albuterol (PROVENTIL) (2.5 MG/3ML) 0.083% nebulizer solution Take 3 mLs (2.5 mg total) by nebulization every 4 (four) hours as needed for wheezing or shortness of breath. 02/26/22   Rising, Wells Guiles, PA-C  albuterol (VENTOLIN HFA) 108 (90 Base) MCG/ACT inhaler Inhale 2 puffs into the lungs every 6 (six) hours as needed for wheezing or shortness of breath. 02/26/22   Rising, Wells Guiles, PA-C  levothyroxine (SYNTHROID) 100 MCG tablet Take 1 tablet (100 mcg total) by mouth daily before breakfast. 03/07/22   Dennard Nip D, MD  metFORMIN (GLUCOPHAGE) 500 MG tablet 1 with breakfast 03/07/22   Dennard Nip D, MD  sertraline (ZOLOFT) 50 MG tablet Take on tablet daily 03/07/22   Dennard Nip D, MD  Vitamin D, Ergocalciferol, (DRISDOL) 1.25 MG (50000 UNIT) CAPS capsule Take 1 capsule (50,000 Units total) by mouth every 3 (three) days. 08/14/20   Abby Potash, PA-C    Family History Family History  Problem Relation Age of Onset   Hypertension Mother    Depression Mother    Bipolar  disorder Mother    Drug abuse Mother    Obesity Mother    Diabetes Father    Thyroid disease Father    Cancer Father    Depression Father    Anxiety disorder Father    Bipolar disorder Father    Schizophrenia Father    Liver disease Father    Sleep apnea Father    Alcohol abuse Father    Drug abuse Father    Obesity Father     Social History Social History   Tobacco Use   Smoking status: Never   Smokeless tobacco: Never  Vaping Use   Vaping Use: Never used  Substance Use Topics   Alcohol use: Yes    Comment: occasional   Drug use: No     Allergies   Patient has no known allergies.   Review of Systems Review of Systems  HENT:  Positive for congestion and sore throat.   Respiratory:  Positive for cough.   Gastrointestinal:  Positive for vomiting.     Physical Exam Triage Vital Signs ED  Triage Vitals [04/10/22 0814]  Enc Vitals Group     BP 110/75     Pulse Rate 64     Resp 12     Temp 98 F (36.7 C)     Temp Source Oral     SpO2 97 %     Weight      Height      Head Circumference      Peak Flow      Pain Score 0     Pain Loc      Pain Edu?      Excl. in [place]?    No data found.  Updated Vital Signs BP 110/75 (BP Location: Right Arm)   Pulse 64   Temp 98 F (36.7 C) (Oral)   Resp 12   LMP 04/06/2022   SpO2 97%   Visual Acuity Right Eye Distance:   Left Eye Distance:   Bilateral Distance:    Right Eye Near:   Left Eye Near:    Bilateral Near:     Physical Exam Vitals and nursing note reviewed.  Constitutional:      General: She is not in acute distress.    Appearance: She is well-developed. She is not ill-appearing.  HENT:     Head: Normocephalic and atraumatic.     Right Ear: Tympanic membrane and ear canal normal.     Left Ear: Tympanic membrane and ear canal normal.     Nose: Congestion present.     Mouth/Throat:     Mouth: Mucous membranes are moist.     Pharynx: Oropharynx is clear. Uvula midline. Posterior oropharyngeal erythema present.     Tonsils: No tonsillar exudate or tonsillar abscesses.  Eyes:     Conjunctiva/sclera: Conjunctivae normal.     Pupils: Pupils are equal, round, and reactive to light.  Cardiovascular:     Rate and Rhythm: Normal rate and regular rhythm.     Heart sounds: Normal heart sounds.  Pulmonary:     Effort: Pulmonary effort is normal.     Breath sounds: Normal breath sounds.  Musculoskeletal:     Cervical back: Normal range of motion and neck supple.  Lymphadenopathy:     Cervical: No cervical adenopathy.  Skin:    General: Skin is warm and dry.  Neurological:     General: No focal deficit present.     Mental Status: She is alert and oriented to  person, place, and time.  Psychiatric:        Mood and Affect: Mood normal.        Behavior: Behavior normal.      UC Treatments / Results   Labs (all labs ordered are listed, but only abnormal results are displayed) Labs Reviewed - No data to display  EKG   Radiology No results found.  Procedures Procedures (including critical care time)  Medications Ordered in UC Medications - No data to display  Initial Impression / Assessment and Plan / UC Course  I have reviewed the triage vital signs and the nursing notes.  Pertinent labs & imaging results that were available during my care of the patient were reviewed by me and considered in my medical decision making (see chart for details).      Discussed with patient viral illness and symptomatic treatment  Patient declined COVID testing Discussed viral URI and symptomatic treatment Tessalon as needed cough Rest and fluids Follow up with PCP in 2-3 days for re-check  ER precautions reviewed and pt verbalized understanding  Final Clinical Impressions(s) / UC Diagnoses   Final diagnoses:  Acute upper respiratory infection     Discharge Instructions      Your symptoms and exam are consistent for a viral illness. Please treat your symptoms with over the counter, tylenol or ibuprofen, humidifier, and rest. Tessalon as needed for cough. Viral illnesses can last 7-14 days. Please follow up with your PCP if your symptoms are not improving. Please go to the ER for any worsening symptoms. This includes but is not limited to fever you can not control with tylenol or ibuprofen, you are not able to stay hydrated, you have shortness of breath or chest pain.  Thank you for choosing [facility] for your healthcare needs. I hope you feel better soon!    ED Prescriptions     Medication Sig Dispense Auth. Provider   benzonatate (TESSALON) 200 MG capsule Take 1 capsule (200 mg total) by mouth 3 (three) times daily as needed for cough. 20 capsule Melynda Ripple, NP      PDMP not reviewed this encounter.   Melynda Ripple, NP 04/10/22 [phone]

## 2022-04-10 NOTE — ED Triage Notes (Signed)
Pt is here for vomiting ,SOB, cough, nasal congestion, and runny nose today

## 2022-04-10 NOTE — Discharge Instructions (Signed)
Your symptoms and exam are consistent for a viral illness. Please treat your symptoms with over the counter, tylenol or ibuprofen, humidifier, and rest. Tessalon as needed for cough. Viral illnesses can last 7-14 days. Please follow up with your PCP if your symptoms are not improving. Please go to the ER for any worsening symptoms. This includes but is not limited to fever you can not control with tylenol or ibuprofen, you are not able to stay hydrated, you have shortness of breath or chest pain.  Thank you for choosing [facility] for your healthcare needs. I hope you feel better soon!

## 2022-04-25 ENCOUNTER — Encounter (INDEPENDENT_AMBULATORY_CARE_PROVIDER_SITE_OTHER): Payer: Self-pay | Admitting: Physician Assistant

## 2022-04-25 ENCOUNTER — Ambulatory Visit (INDEPENDENT_AMBULATORY_CARE_PROVIDER_SITE_OTHER): Payer: 59 | Admitting: Physician Assistant

## 2022-04-25 VITALS — BP 114/73 | HR 61 | Temp 98.2°F | Ht 73.0 in

## 2022-04-25 DIAGNOSIS — E669 Obesity, unspecified: Secondary | ICD-10-CM

## 2022-04-25 DIAGNOSIS — Z6833 Body mass index (BMI) 33.0-33.9, adult: Secondary | ICD-10-CM

## 2022-04-25 DIAGNOSIS — J4521 Mild intermittent asthma with (acute) exacerbation: Secondary | ICD-10-CM

## 2022-04-25 DIAGNOSIS — E038 Other specified hypothyroidism: Secondary | ICD-10-CM | POA: Diagnosis not present

## 2022-04-25 DIAGNOSIS — F418 Other specified anxiety disorders: Secondary | ICD-10-CM

## 2022-04-25 DIAGNOSIS — E88819 Insulin resistance, unspecified: Secondary | ICD-10-CM | POA: Diagnosis not present

## 2022-04-25 MED ORDER — ALBUTEROL SULFATE HFA 108 (90 BASE) MCG/ACT IN AERS: 2.0000 | INHALATION_SPRAY | Freq: Four times a day (QID) | RESPIRATORY_TRACT | 0 refills | Status: DC | PRN

## 2022-04-25 MED ORDER — LEVOTHYROXINE SODIUM 100 MCG PO TABS: 100.0000 ug | ORAL_TABLET | Freq: Every day | ORAL | 0 refills | Status: DC

## 2022-04-25 MED ORDER — METFORMIN HCL 500 MG PO TABS: ORAL_TABLET | ORAL | 0 refills | Status: DC

## 2022-04-25 MED ORDER — SERTRALINE HCL 50 MG PO TABS: ORAL_TABLET | ORAL | 0 refills | Status: DC

## 2022-05-07 NOTE — Progress Notes (Signed)
Chief Complaint:   OBESITY Sherry Schmidt is here to discuss her progress with her obesity treatment plan along with follow-up of her obesity related diagnoses. Sherry Schmidt is on keeping a food journal and adhering to recommended goals of 1600-1900 calories and 120 grams of protein and states she is following her eating plan approximately 70% of the time. Sherry Schmidt states she is going to gym 60 minutes 5 times per week.  Today's visit was #: 110 Starting weight: 312 lbs Starting date: 11/10/2019 Today's weight: 256 lbs Today's date: 04/25/2022 Total lbs lost to date: 56 lbs Total lbs lost since last in-office visit: 0  Interim History: Purity has done well with weight loss overall. Struggling lately to follow nutrition plan/and journal. She reports some increased work related stress-works as a Secretary/administrator at [employer].  She feels like she has been a little more depressed and doing some comfort eating lately. She does need a new primary care provider as her insurance recently changed and she was referred to the [facility] primary care site to try to arrange for a new primary care doctor.  Subjective:   1. Other specified hypothyroidism Sherry Schmidt is on Synthroid 100 mcg daily.  TSH- none recent.  Needs to establish care with PCP as well.  2. Insulin resistance On Metformin 500 mg every morning.  Denies any side effects.  A1c and insulin-no recent labs from this year.  She is working on decreasing simple carbs, and increasing protein to promote weight loss, improve glycemic control and prevent progression to type 2 diabetes.  3. Mild intermittent asthma with acute exacerbation Stefan has mild asthma some increased symptoms lately with URI and used last inhaler.  Need to establish care with PCP as well for management.  4. Depression with anxiety Sherry Schmidt is on Zoloft 50 mg daily-Denies any side effects.  Mood decreased lately.  Some emotional eating behavior and comfort  eating.  Assessment/Plan:   1. Other specified hypothyroidism Continue/Refill Levothyroxine 100 mcg daily for 1 month with 0 refills.  Continue Prescribed Nutrition Plan and exercise to promote weight loss. Establish a PCP.  -Refill levothyroxine (SYNTHROID) 100 MCG tablet; Take 1 tablet (100 mcg total) by mouth daily before breakfast.  Dispense: 30 tablet; Refill: 0  2. Insulin resistance Continue/Refill Metformin 500 mg daily for 1 month with 0 refills.  Continue Prescribed Nutrition Plan and exercise to promote weight loss, improve insulin resistance and glycemic control, and prevent development of type 2 diabetes.   -Refill metFORMIN (GLUCOPHAGE) 500 MG tablet; 1 with breakfast  Dispense: 30 tablet; Refill: 0  3. Mild intermittent asthma with acute exacerbation Continue/Refill Albuterol 108 mcg daily as needed for 1 month with 0 refills.  Establish a PCP.  -Refill albuterol (VENTOLIN HFA) 108 (90 Base) MCG/ACT inhaler; Inhale 2 puffs into the lungs every 6 (six) hours as needed for wheezing or shortness of breath.  Dispense: 1 each; Refill: 0  4. Depression with anxiety Continue/Refill Zoloft 50 mg daily for 1 month with 0 refills.  Will refer to Dr. Mallie Mussel.  -Refill sertraline (ZOLOFT) 50 MG tablet; Take on tablet daily  Dispense: 30 tablet; Refill: 0  5. Obesity, current BMI 33.9 Sherry Schmidt is currently in the action stage of change. As such, her goal is to continue with weight loss efforts. She has agreed to keeping a food journal and adhering to recommended goals of 1600-1900 calories and 120+ grams of protein daily.   Exercise goals: As is.  Behavioral modification strategies: increasing lean protein  intake, decreasing simple carbohydrates, meal planning and cooking strategies, emotional eating strategies, and keeping a strict food journal.  Sherry Schmidt has agreed to follow-up with our clinic in 4 weeks. She was informed of the importance of frequent follow-up visits to maximize  her success with intensive lifestyle modifications for her multiple health conditions.   Objective:   Blood pressure 114/73, pulse 61, temperature 98.2 F (36.8 C), height 6' 1"$  (1.854 m), last menstrual period 04/06/2022, SpO2 99 %. Body mass index is 32.98 kg/m.  General: Cooperative, alert, well developed, in no acute distress. HEENT: Conjunctivae and lids unremarkable. Cardiovascular: Regular rhythm.  Lungs: Normal work of breathing. Neurologic: No focal deficits.   Lab Results  Component Value Date   CREATININE 0.69 11/19/2021   BUN 11 11/19/2021   NA 138 11/19/2021   K 3.8 11/19/2021   CL 109 11/19/2021   CO2 20 (L) 11/19/2021   Lab Results  Component Value Date   ALT 13 11/19/2021   AST 14 (L) 11/19/2021   ALKPHOS 57 11/19/2021   BILITOT 0.6 11/19/2021   Lab Results  Component Value Date   HGBA1C 5.3 12/14/2020   HGBA1C 5.4 03/06/2020   HGBA1C 5.6 11/10/2019   Lab Results  Component Value Date   INSULIN 15.8 12/14/2020   INSULIN 80.9 (H) 03/06/2020   INSULIN 29.3 (H) 11/10/2019   Lab Results  Component Value Date   TSH 2.490 12/14/2020   Lab Results  Component Value Date   CHOL 187 12/14/2020   HDL 46 12/14/2020   LDLCALC 117 (H) 12/14/2020   TRIG 135 12/14/2020   Lab Results  Component Value Date   VD25OH 40.3 12/14/2020   VD25OH 17.5 (L) 03/06/2020   VD25OH 18.8 (L) 11/10/2019   Lab Results  Component Value Date   WBC 13.8 (H) 11/19/2021   HGB 13.5 11/19/2021   HCT 41.2 11/19/2021   MCV 88.2 11/19/2021   PLT 295 11/19/2021   No results found for: "IRON", "TIBC", "FERRITIN"  Attestation Statements:   Reviewed by clinician on day of visit: allergies, medications, problem list, medical history, surgical history, family history, social history, and previous encounter notes.  I, Brendell Tyus, am acting as transcriptionist for [employer], PA.  I have reviewed the above documentation for accuracy and completeness, and I  agree with the above. -  Dyquan Minks,PA-C

## 2022-05-14 ENCOUNTER — Encounter (HOSPITAL_COMMUNITY): Payer: Self-pay

## 2022-05-14 ENCOUNTER — Ambulatory Visit (HOSPITAL_COMMUNITY)
Admission: EM | Admit: 2022-05-14 | Discharge: 2022-05-14 | Disposition: A | Discharge status: Home / Self Care | Payer: 59 | Attending: Emergency Medicine | Admitting: Emergency Medicine

## 2022-05-14 DIAGNOSIS — B349 Viral infection, unspecified: Secondary | ICD-10-CM | POA: Diagnosis not present

## 2022-05-14 LAB — POC INFLUENZA A AND B ANTIGEN (URGENT CARE ONLY)
INFLUENZA A ANTIGEN, POC: NEGATIVE
INFLUENZA B ANTIGEN, POC: NEGATIVE

## 2022-05-14 NOTE — ED Triage Notes (Signed)
Pt presents with nasal congestion, bodyaches, sore throat and general illness x 2 days. Pt denies fever at home.

## 2022-05-14 NOTE — Discharge Instructions (Signed)
I recommend Mucinex DM for congestion/cough Take this with lots of fluids! You can use ibuprofen or Tylenol, throat lozenges, salt water gargles, honey for sore throat/aches

## 2022-05-14 NOTE — ED Provider Notes (Signed)
[place]    CSN: [id number] Arrival date & time: 05/14/22  0805     History   Chief Complaint Chief Complaint  Patient presents with   Nasal Congestion    HPI Sherry Schmidt is a 27 y.o. female.  Presents with 2-day history of nasal congestion, sore throat, body aches, fatigue Reports throat is only 1/10 pain No fevers She has tried OTC cold medicine, ibu Possible sick contacts at work  Past Medical History:  Diagnosis Date   Asthma    Back pain    Back pain    Depression    Panic attacks    Thyroid disease     Patient Active Problem List   Diagnosis Date Noted   Pre-diabetes 09/27/2021   Hypothyroidism 05/12/2020   Transaminitis 03/06/2020   Abnormal thyroid blood test 03/06/2020   Mixed hyperlipidemia 03/06/2020   Vitamin D deficiency 02/18/2020   Insulin resistance 02/18/2020   Depression with anxiety 02/18/2020   Class 3 severe obesity with serious comorbidity and body mass index (BMI) of 40.0 to 44.9 in adult ([place]) 02/18/2020   Fatigue 11/06/2016   Postprocedural hypothyroidism 10/16/2016    Past Surgical History:  Procedure Laterality Date   THYROIDECTOMY      OB History     Gravida  0   Para  0   Term  0   Preterm  0   AB  0   Living  0      SAB  0   IAB  0   Ectopic  0   Multiple  0   Live Births  0            Home Medications    Prior to Admission medications   Medication Sig Start Date End Date Taking? Authorizing Provider  albuterol (VENTOLIN HFA) 108 (90 Base) MCG/ACT inhaler Inhale 2 puffs into the lungs every 6 (six) hours as needed for wheezing or shortness of breath. 04/25/22   Rayburn, Neta Mends, PA-C  levothyroxine (SYNTHROID) 100 MCG tablet Take 1 tablet (100 mcg total) by mouth daily before breakfast. 04/25/22   Rayburn, Neta Mends, PA-C  metFORMIN (GLUCOPHAGE) 500 MG tablet 1 with breakfast 04/25/22   Rayburn, Neta Mends, PA-C  sertraline (ZOLOFT) 50 MG tablet Take on  tablet daily 04/25/22   Rayburn, Neta Mends, PA-C  Vitamin D, Ergocalciferol, (DRISDOL) 1.25 MG (50000 UNIT) CAPS capsule Take 1 capsule (50,000 Units total) by mouth every 3 (three) days. 08/14/20   Abby Potash, PA-C    Family History Family History  Problem Relation Age of Onset   Hypertension Mother    Depression Mother    Bipolar disorder Mother    Drug abuse Mother    Obesity Mother    Diabetes Father    Thyroid disease Father    Cancer Father    Depression Father    Anxiety disorder Father    Bipolar disorder Father    Schizophrenia Father    Liver disease Father    Sleep apnea Father    Alcohol abuse Father    Drug abuse Father    Obesity Father     Social History Social History   Tobacco Use   Smoking status: Never   Smokeless tobacco: Never  Vaping Use   Vaping Use: Never used  Substance Use Topics   Alcohol use: Yes    Comment: occasional   Drug use: No     Allergies   Patient has no known allergies.  Review of Systems Review of Systems As per HPI  Physical Exam Triage Vital Signs ED Triage Vitals  Enc Vitals Group     BP 05/14/22 0824 117/81     Pulse Rate 05/14/22 0824 (!) 57     Resp 05/14/22 0824 16     Temp 05/14/22 0824 98 F (36.7 C)     Temp src --      SpO2 05/14/22 0824 97 %     Weight --      Height --      Head Circumference --      Peak Flow --      Pain Score 05/14/22 0823 0     Pain Loc --      Pain Edu? --      Excl. in [place]? --    No data found.  Updated Vital Signs BP 117/81   Pulse (!) 57   Temp 98 F (36.7 C)   Resp 16   LMP 04/14/2022   SpO2 97%   Physical Exam Vitals and nursing note reviewed.  Constitutional:      General: She is not in acute distress. HENT:     Right Ear: Tympanic membrane and ear canal normal.     Left Ear: Tympanic membrane and ear canal normal.     Nose: No congestion or rhinorrhea.     Mouth/Throat:     Mouth: Mucous membranes are moist.     Pharynx: Oropharynx is  clear. No posterior oropharyngeal erythema.  Eyes:     Conjunctiva/sclera: Conjunctivae normal.  Cardiovascular:     Rate and Rhythm: Normal rate and regular rhythm.     Pulses: Normal pulses.     Heart sounds: Normal heart sounds.  Pulmonary:     Effort: Pulmonary effort is normal.     Breath sounds: Normal breath sounds.  Musculoskeletal:     Cervical back: Normal range of motion.  Lymphadenopathy:     Cervical: No cervical adenopathy.  Skin:    General: Skin is warm and dry.  Neurological:     Mental Status: She is alert and oriented to person, place, and time.      UC Treatments / Results  Labs (all labs ordered are listed, but only abnormal results are displayed) Labs Reviewed  POC INFLUENZA A AND B ANTIGEN (URGENT CARE ONLY)    EKG   Radiology No results found.  Procedures Procedures (including critical care time)  Medications Ordered in UC Medications - No data to display  Initial Impression / Assessment and Plan / UC Course  I have reviewed the triage vital signs and the nursing notes.  Pertinent labs & imaging results that were available during my care of the patient were reviewed by me and considered in my medical decision making (see chart for details).  Rapid flu A/B negative Discussed viral etiology, symptomatic care at home Work note provided Return precautions discussed. Patient agrees to plan  Final Clinical Impressions(s) / UC Diagnoses   Final diagnoses:  Viral illness     Discharge Instructions      I recommend Mucinex DM for congestion/cough Take this with lots of fluids! You can use ibuprofen or Tylenol, throat lozenges, salt water gargles, honey for sore throat/aches    ED Prescriptions   None    PDMP not reviewed this encounter.   Jenice Leiner, Vernice Jefferson 05/14/22 [phone]

## 2022-05-23 ENCOUNTER — Encounter (INDEPENDENT_AMBULATORY_CARE_PROVIDER_SITE_OTHER): Payer: Self-pay | Admitting: Physician Assistant

## 2022-05-23 ENCOUNTER — Ambulatory Visit (INDEPENDENT_AMBULATORY_CARE_PROVIDER_SITE_OTHER): Payer: 59 | Admitting: Physician Assistant

## 2022-05-23 VITALS — BP 123/83 | HR 59 | Temp 97.6°F | Ht 73.0 in | Wt 249.8 lb

## 2022-05-23 DIAGNOSIS — Z6832 Body mass index (BMI) 32.0-32.9, adult: Secondary | ICD-10-CM

## 2022-05-23 DIAGNOSIS — E669 Obesity, unspecified: Secondary | ICD-10-CM | POA: Diagnosis not present

## 2022-05-23 DIAGNOSIS — E038 Other specified hypothyroidism: Secondary | ICD-10-CM | POA: Diagnosis not present

## 2022-05-23 DIAGNOSIS — E559 Vitamin D deficiency, unspecified: Secondary | ICD-10-CM

## 2022-05-23 DIAGNOSIS — R7303 Prediabetes: Secondary | ICD-10-CM

## 2022-05-23 DIAGNOSIS — Z6833 Body mass index (BMI) 33.0-33.9, adult: Secondary | ICD-10-CM

## 2022-05-23 DIAGNOSIS — F418 Other specified anxiety disorders: Secondary | ICD-10-CM

## 2022-05-23 MED ORDER — METFORMIN HCL 500 MG PO TABS: ORAL_TABLET | ORAL | 0 refills | Status: DC

## 2022-05-23 MED ORDER — LEVOTHYROXINE SODIUM 100 MCG PO TABS: 100.0000 ug | ORAL_TABLET | Freq: Every day | ORAL | 0 refills | Status: DC

## 2022-05-23 MED ORDER — SERTRALINE HCL 50 MG PO TABS: 75.0000 mg | ORAL_TABLET | Freq: Every day | ORAL | 0 refills | Status: DC

## 2022-05-23 NOTE — Progress Notes (Signed)
Chief Complaint:   OBESITY Sherry Schmidt is here to discuss her progress with her obesity treatment plan along with follow-up of her obesity related diagnoses. Sherry Schmidt is on keeping a food journal and adhering to recommended goals of 1600-1900 calories and 120 grams of protein and states she is following her eating plan approximately 85% of the time. Sherry Schmidt states she is working out at gym 60 minutes 5 times per week.  Today's visit was #: 54 Starting weight: 312 lbs Starting date: 11/10/2019 Today's weight: 249 lbs Today's date: 05/23/2022 Total lbs lost to date: 63 lbs Total lbs lost since last in-office visit: 7 lbs  Interim History: Sherry Schmidt has done well with weight loss.  She has been focusing on getting in adequate protein and calories and is more mindful.  Hunger is controlled. Feels her mental health decreases her motivation at times.  She has appointment with a new PCP over the next couple of weeks.  Will plan for Fasting Labs next visit.   Subjective:   1. Insulin resistance Last A1c 5.3 but no recent lab. Will plan to check fasting labs at next visit.  On metformin 500 mg daily. No side effects.   2. Other specified hypothyroidism On synthroid 100 mcg daily. No side effects. Endorses fatigue. Will check labs next visit.   3. Depression with anxiety On Zoloft 34m daily. Has noticed mood and motivation are low at times. No SI/HI.  Has been on 75 mg at times and feels this helped more. Also open to counsel.   4. Obesity ([place]- Start BMI 41.16 5. BMI 33.0-33.9,adult, Current BMI 33.0  Assessment/Plan:   1. Insulin resistance Continue metformin 500 mg daily. Continue nutrition plan to decrease simple carbohydrates, increase lean protein and exercise to promote weight loss and improve insulin resistance.  REFILL- metFORMIN (GLUCOPHAGE) 500 MG tablet; 1 with breakfast  Dispense: 30 tablet; Refill: 0  2. Other specified hypothyroidism Continue synthroid and will  check TSH next visit. She is going to establish care with a new PCP soon.  - levothyroxine (SYNTHROID) 100 MCG tablet; Take 1 tablet (100 mcg total) by mouth daily before breakfast.  Dispense: 30 tablet; Refill: 0  3. Depression with anxiety Will increase Zoloft to 75 mg daily and monitor response. She is also open to counsel.  Will refer to Dr. BMallie Mussel  - sertraline (ZOLOFT) 50 MG tablet; Take 1.5 tablets (75 mg total) by mouth daily. Take 1.5 tablets daily  Dispense: 45 tablet; Refill: 0  5. Obesity ([place]- Start BMI 41.16 6. BMI 33.0-33.9,adult, Current BMI 33.0  Sherry Schmidt is currently in the action stage of change. As such, her goal is to continue with weight loss efforts. She has agreed to keeping a food journal and adhering to recommended goals of 1600-1900 calories and 120 grams of protein.   Exercise goals:  as is  Behavioral modification strategies: increasing lean protein intake, decreasing simple carbohydrates, no skipping meals, planning for success, and keeping a strict food journal.  Sherry Schmidt agreed to follow-up with our clinic in 4 weeks. She was informed of the importance of frequent follow-up visits to maximize her success with intensive lifestyle modifications for her multiple health conditions.    Objective:   Blood pressure 123/83, pulse (!) 59, temperature 97.6 F (36.4 C), height 6' 1"$  (1.854 m), weight 249 lb 12.8 oz (113.3 kg), last menstrual period 04/14/2022, SpO2 98 %. Body mass index is 32.96 kg/m.  General: Cooperative, alert, well developed, in no acute distress.  HEENT: Conjunctivae and lids unremarkable. Cardiovascular: Regular rhythm.  Lungs: Normal work of breathing. Neurologic: No focal deficits.   Lab Results  Component Value Date   CREATININE 0.69 11/19/2021   BUN 11 11/19/2021   NA 138 11/19/2021   K 3.8 11/19/2021   CL 109 11/19/2021   CO2 20 (L) 11/19/2021   Lab Results  Component Value Date   ALT 13 11/19/2021   AST 14 (L)  11/19/2021   ALKPHOS 57 11/19/2021   BILITOT 0.6 11/19/2021   Lab Results  Component Value Date   HGBA1C 5.3 12/14/2020   HGBA1C 5.4 03/06/2020   HGBA1C 5.6 11/10/2019   Lab Results  Component Value Date   INSULIN 15.8 12/14/2020   INSULIN 80.9 (H) 03/06/2020   INSULIN 29.3 (H) 11/10/2019   Lab Results  Component Value Date   TSH 2.490 12/14/2020   Lab Results  Component Value Date   CHOL 187 12/14/2020   HDL 46 12/14/2020   LDLCALC 117 (H) 12/14/2020   TRIG 135 12/14/2020   Lab Results  Component Value Date   VD25OH 40.3 12/14/2020   VD25OH 17.5 (L) 03/06/2020   VD25OH 18.8 (L) 11/10/2019   Lab Results  Component Value Date   WBC 13.8 (H) 11/19/2021   HGB 13.5 11/19/2021   HCT 41.2 11/19/2021   MCV 88.2 11/19/2021   PLT 295 11/19/2021   No results found for: "IRON", "TIBC", "FERRITIN"  Obesity Behavioral Intervention:   Approximately 15 minutes were spent on the discussion below.  ASK: We discussed the diagnosis of obesity with Sherry Schmidt today and Sherry Schmidt agreed to give [place] permission to discuss obesity behavioral modification therapy today.  ASSESS: Sherry Schmidt has the diagnosis of obesity and her BMI today is 33.0. Sherry Schmidt is in the action stage of change.   ADVISE: Sherry Schmidt was educated on the multiple health risks of obesity as well as the benefit of weight loss to improve her health. She was advised of the need for long term treatment and the importance of lifestyle modifications to improve her current health and to decrease her risk of future health problems.  AGREE: Multiple dietary modification options and treatment options were discussed and Sherry Schmidt agreed to follow the recommendations documented in the above note.  ARRANGE: Sherry Schmidt was educated on the importance of frequent visits to treat obesity as outlined per CMS and USPSTF guidelines and agreed to schedule her next follow up appointment today.  Attestation Statements:   Reviewed by  clinician on day of visit: allergies, medications, problem list, medical history, surgical history, family history, social history, and previous encounter notes.  Cashae Weich,PA-C

## 2022-06-10 ENCOUNTER — Ambulatory Visit: Payer: 59 | Admitting: Family

## 2022-06-10 NOTE — Progress Notes (Deleted)
   New Patient Office Visit  Subjective:  Patient ID: Sherry Schmidt, female    DOB: 1995-11-27  Age: 27 y.o. MRN: [id number]  CC: No chief complaint on file.   HPI Ladiamond Waldrep presents for establishing care today.  Assessment & Plan:  There are no diagnoses linked to this encounter.  Subjective:    Outpatient Medications Prior to Visit  Medication Sig Dispense Refill   albuterol (VENTOLIN HFA) 108 (90 Base) MCG/ACT inhaler Inhale 2 puffs into the lungs every 6 (six) hours as needed for wheezing or shortness of breath. 1 each 0   levothyroxine (SYNTHROID) 100 MCG tablet Take 1 tablet (100 mcg total) by mouth daily before breakfast. 30 tablet 0   metFORMIN (GLUCOPHAGE) 500 MG tablet 1 with breakfast 30 tablet 0   sertraline (ZOLOFT) 50 MG tablet Take 1.5 tablets (75 mg total) by mouth daily. Take 1.5 tablets daily 45 tablet 0   Vitamin D, Ergocalciferol, (DRISDOL) 1.25 MG (50000 UNIT) CAPS capsule Take 1 capsule (50,000 Units total) by mouth every 3 (three) days. 8 capsule 0   No facility-administered medications prior to visit.   Past Medical History:  Diagnosis Date   Asthma    Back pain    Back pain    Depression    Panic attacks    Thyroid disease    Past Surgical History:  Procedure Laterality Date   THYROIDECTOMY      Objective:   Today's Vitals: LMP 04/14/2022   Physical Exam Vitals and nursing note reviewed.  Constitutional:      Appearance: Normal appearance.  Cardiovascular:     Rate and Rhythm: Normal rate and regular rhythm.  Pulmonary:     Effort: Pulmonary effort is normal.     Breath sounds: Normal breath sounds.  Musculoskeletal:        General: Normal range of motion.  Skin:    General: Skin is warm and dry.  Neurological:     Mental Status: She is alert.  Psychiatric:        Mood and Affect: Mood normal.        Behavior: Behavior normal.     No orders of the defined types were placed in this encounter.   Jeanie Sewer, NP

## 2022-06-18 ENCOUNTER — Encounter: Payer: 59 | Admitting: Radiology

## 2022-06-20 ENCOUNTER — Ambulatory Visit (INDEPENDENT_AMBULATORY_CARE_PROVIDER_SITE_OTHER): Payer: 59 | Admitting: Physician Assistant

## 2022-06-20 DIAGNOSIS — F418 Other specified anxiety disorders: Secondary | ICD-10-CM

## 2022-06-20 DIAGNOSIS — E038 Other specified hypothyroidism: Secondary | ICD-10-CM

## 2022-06-20 DIAGNOSIS — R7303 Prediabetes: Secondary | ICD-10-CM

## 2022-07-04 ENCOUNTER — Ambulatory Visit (HOSPITAL_COMMUNITY)
Admission: EM | Admit: 2022-07-04 | Discharge: 2022-07-04 | Disposition: A | Discharge status: Home / Self Care | Payer: 59 | Attending: Family Medicine | Admitting: Family Medicine

## 2022-07-04 ENCOUNTER — Encounter (HOSPITAL_COMMUNITY): Payer: Self-pay | Admitting: Emergency Medicine

## 2022-07-04 DIAGNOSIS — R0789 Other chest pain: Secondary | ICD-10-CM

## 2022-07-04 DIAGNOSIS — J4521 Mild intermittent asthma with (acute) exacerbation: Secondary | ICD-10-CM | POA: Diagnosis not present

## 2022-07-04 MED ORDER — PREDNISONE 20 MG PO TABS: 40.0000 mg | ORAL_TABLET | Freq: Every day | ORAL | 0 refills | Status: AC

## 2022-07-04 MED ORDER — ALBUTEROL SULFATE HFA 108 (90 BASE) MCG/ACT IN AERS: 2.0000 | INHALATION_SPRAY | RESPIRATORY_TRACT | 0 refills | Status: AC | PRN

## 2022-07-04 NOTE — ED Triage Notes (Addendum)
Pt reports chest pain x 2 days. States the chest pain is in the mid chest area and radiates to the right. Reports she was at work when chest pain started and noticed her chest was tight after about 15 minutes after sneezing from dust. Took Benadryl when chest pain started thinking symptoms were related to allergies. Currently rates pain about 2/10. Denies SOB, jaw pain, left arm radiation and nausea.

## 2022-07-04 NOTE — ED Provider Notes (Addendum)
[facility]    CSN: [id number] Arrival date & time: 07/04/22  [id number]      History   Chief Complaint Chief Complaint  Patient presents with   Chest Pain    HPI Sherry Schmidt is a 27 y.o. female.    Chest Pain  Here for sharp chest pain in her right upper anterior chest.  2 days when she was at work she noticed some chest tightness about 15 minutes after she had sneezed around some dust.  She took a Benadryl that may be helped some.  She is continue to have some chest pain that is maybe about a 2 out of 10.  Her chest still feels tight she has not heard any wheezing she has used her inhaler some.  No cough or congestion or fever.  She does have a history of asthma   Past Medical History:  Diagnosis Date   Asthma    Back pain    Back pain    Depression    Panic attacks    Thyroid disease     Patient Active Problem List   Diagnosis Date Noted   BMI 33.0-33.9,adult, Current BMI 33.0 05/23/2022   Obesity ([place])- Start BMI 41.16 05/23/2022   Pre-diabetes 09/27/2021   Hypothyroidism 05/12/2020   Transaminitis 03/06/2020   Abnormal thyroid blood test 03/06/2020   Mixed hyperlipidemia 03/06/2020   Vitamin D deficiency 02/18/2020   Insulin resistance 02/18/2020   Depression with anxiety 02/18/2020   Class 3 severe obesity with serious comorbidity and body mass index (BMI) of 40.0 to 44.9 in adult 02/18/2020   Fatigue 11/06/2016   Postprocedural hypothyroidism 10/16/2016    Past Surgical History:  Procedure Laterality Date   THYROIDECTOMY      OB History     Gravida  0   Para  0   Term  0   Preterm  0   AB  0   Living  0      SAB  0   IAB  0   Ectopic  0   Multiple  0   Live Births  0            Home Medications    Prior to Admission medications   Medication Sig Start Date End Date Taking? Authorizing Provider  albuterol (VENTOLIN HFA) 108 (90 Base) MCG/ACT inhaler Inhale 2 puffs into the lungs every 4 (four) hours as  needed for wheezing or shortness of breath. 07/04/22  Yes Barrett Henle, MD  predniSONE (DELTASONE) 20 MG tablet Take 2 tablets (40 mg total) by mouth daily with breakfast for 5 days. 07/04/22 07/09/22 Yes Barrett Henle, MD  levothyroxine (SYNTHROID) 100 MCG tablet Take 1 tablet (100 mcg total) by mouth daily before breakfast. 05/23/22   Rayburn, Neta Mends, PA-C  metFORMIN (GLUCOPHAGE) 500 MG tablet 1 with breakfast 05/23/22   Rayburn, Neta Mends, PA-C  sertraline (ZOLOFT) 50 MG tablet Take 1.5 tablets (75 mg total) by mouth daily. Take 1.5 tablets daily 05/23/22   Rayburn, Neta Mends, PA-C  Vitamin D, Ergocalciferol, (DRISDOL) 1.25 MG (50000 UNIT) CAPS capsule Take 1 capsule (50,000 Units total) by mouth every 3 (three) days. 08/14/20   Abby Potash, PA-C    Family History Family History  Problem Relation Age of Onset   Hypertension Mother    Depression Mother    Bipolar disorder Mother    Drug abuse Mother    Obesity Mother    Diabetes Father    Thyroid disease  Father    Cancer Father    Depression Father    Anxiety disorder Father    Bipolar disorder Father    Schizophrenia Father    Liver disease Father    Sleep apnea Father    Alcohol abuse Father    Drug abuse Father    Obesity Father     Social History Social History   Tobacco Use   Smoking status: Never   Smokeless tobacco: Never  Vaping Use   Vaping Use: Never used  Substance Use Topics   Alcohol use: Yes    Comment: occasional   Drug use: No     Allergies   Patient has no known allergies.   Review of Systems Review of Systems  Cardiovascular:  Positive for chest pain.     Physical Exam Triage Vital Signs ED Triage Vitals  Enc Vitals Group     BP 07/04/22 0936 112/71     Pulse Rate 07/04/22 0936 64     Resp 07/04/22 0936 17     Temp 07/04/22 0936 98.8 F (37.1 C)     Temp Source 07/04/22 0936 Oral     SpO2 07/04/22 0936 96 %     Weight --      Height --      Head  Circumference --      Peak Flow --      Pain Score 07/04/22 0935 2     Pain Loc --      Pain Edu? --      Excl. in [place]? --    No data found.  Updated Vital Signs BP 112/71 (BP Location: Right Arm)   Pulse 64   Temp 98.8 F (37.1 C) (Oral)   Resp 17   SpO2 96%   Visual Acuity Right Eye Distance:   Left Eye Distance:   Bilateral Distance:    Right Eye Near:   Left Eye Near:    Bilateral Near:     Physical Exam Vitals reviewed.  Constitutional:      General: She is not in acute distress.    Appearance: She is not toxic-appearing.  HENT:     Nose: Nose normal.     Mouth/Throat:     Mouth: Mucous membranes are moist.     Pharynx: No oropharyngeal exudate or posterior oropharyngeal erythema.  Eyes:     Extraocular Movements: Extraocular movements intact.     Conjunctiva/sclera: Conjunctivae normal.     Pupils: Pupils are equal, round, and reactive to light.  Cardiovascular:     Rate and Rhythm: Normal rate and regular rhythm.     Heart sounds: No murmur heard. Pulmonary:     Effort: Pulmonary effort is normal. No respiratory distress.     Breath sounds: No stridor. No wheezing, rhonchi or rales.  Chest:     Chest wall: Tenderness (There is tenderness in the right anterior chest at about rib 3 and 4.  That reproduces the pain she is experiencing) present.  Musculoskeletal:     Cervical back: Neck supple.  Lymphadenopathy:     Cervical: No cervical adenopathy.  Skin:    Capillary Refill: Capillary refill takes less than 2 seconds.     Coloration: Skin is not jaundiced or pale.  Neurological:     General: No focal deficit present.     Mental Status: She is alert and oriented to person, place, and time.  Psychiatric:        Behavior: Behavior normal.  UC Treatments / Results  Labs (all labs ordered are listed, but only abnormal results are displayed) Labs Reviewed - No data to display  EKG   Radiology No results found.  Procedures Procedures  (including critical care time)  Medications Ordered in UC Medications - No data to display  Initial Impression / Assessment and Plan / UC Course  I have reviewed the triage vital signs and the nursing notes.  Pertinent labs & imaging results that were available during my care of the patient were reviewed by me and considered in my medical decision making (see chart for details).      EKG was benign with sinus bradycardia  Since this may also be asthma exacerbation with her chest feeling tight, I am going to send in 5 days prednisone burst.  And she also states she needs new inhaler.  Final Clinical Impressions(s) / UC Diagnoses   Final diagnoses:  Chest wall pain  Mild intermittent asthma with acute exacerbation     Discharge Instructions      Albuterol inhaler--do 2 puffs every 4 hours as needed for shortness of breath or wheezing  Take prednisone 20 mg--2 daily for 5 days  Take Tylenol 500 mg--2 every 6 hours as needed for pain.  A heating pad can also help how this feels     ED Prescriptions     Medication Sig Dispense Auth. Provider   albuterol (VENTOLIN HFA) 108 (90 Base) MCG/ACT inhaler Inhale 2 puffs into the lungs every 4 (four) hours as needed for wheezing or shortness of breath. 1 each Barrett Henle, MD   predniSONE (DELTASONE) 20 MG tablet Take 2 tablets (40 mg total) by mouth daily with breakfast for 5 days. 10 tablet Windy Carina Gwenlyn Perking, MD      PDMP not reviewed this encounter.   Barrett Henle, MD 07/04/22 [phone]    Barrett Henle, MD 07/04/22 [phone]

## 2022-07-04 NOTE — Discharge Instructions (Addendum)
Albuterol inhaler--do 2 puffs every 4 hours as needed for shortness of breath or wheezing  Take prednisone 20 mg--2 daily for 5 days  Take Tylenol 500 mg--2 every 6 hours as needed for pain.  A heating pad can also help how this feels

## 2022-07-30 ENCOUNTER — Ambulatory Visit (INDEPENDENT_AMBULATORY_CARE_PROVIDER_SITE_OTHER): Payer: 59 | Admitting: Physician Assistant

## 2022-07-30 ENCOUNTER — Encounter (INDEPENDENT_AMBULATORY_CARE_PROVIDER_SITE_OTHER): Payer: Self-pay | Admitting: Physician Assistant

## 2022-07-30 VITALS — BP 119/77 | HR 63 | Temp 98.9°F | Ht 73.0 in | Wt 250.0 lb

## 2022-07-30 DIAGNOSIS — E669 Obesity, unspecified: Secondary | ICD-10-CM

## 2022-07-30 DIAGNOSIS — F418 Other specified anxiety disorders: Secondary | ICD-10-CM | POA: Diagnosis not present

## 2022-07-30 DIAGNOSIS — E559 Vitamin D deficiency, unspecified: Secondary | ICD-10-CM | POA: Diagnosis not present

## 2022-07-30 DIAGNOSIS — E039 Hypothyroidism, unspecified: Secondary | ICD-10-CM | POA: Diagnosis not present

## 2022-07-30 DIAGNOSIS — R7303 Prediabetes: Secondary | ICD-10-CM | POA: Diagnosis not present

## 2022-07-30 DIAGNOSIS — E038 Other specified hypothyroidism: Secondary | ICD-10-CM

## 2022-07-30 DIAGNOSIS — Z6833 Body mass index (BMI) 33.0-33.9, adult: Secondary | ICD-10-CM

## 2022-07-30 MED ORDER — SERTRALINE HCL 50 MG PO TABS: 75.0000 mg | ORAL_TABLET | Freq: Every day | ORAL | 0 refills | Status: DC

## 2022-07-30 MED ORDER — METFORMIN HCL 500 MG PO TABS: ORAL_TABLET | ORAL | 0 refills | Status: DC

## 2022-07-30 MED ORDER — LEVOTHYROXINE SODIUM 100 MCG PO TABS: 100.0000 ug | ORAL_TABLET | Freq: Every day | ORAL | 0 refills | Status: DC

## 2022-07-30 NOTE — Assessment & Plan Note (Signed)
Prediabetes Last A1c was 5.3 at goal.   Medication(s): Metformin 500 mg once daily breakfast No side effects with metformin. Out of medication.  She is working on Engineer, technical sales to decrease simple carbohydrates, increase lean proteins and exercise to promote weight loss, improve glycemic control and prevent progression to Type 2 diabetes.   Lab Results  Component Value Date   HGBA1C 5.3 12/14/2020   HGBA1C 5.4 03/06/2020   HGBA1C 5.6 11/10/2019   Lab Results  Component Value Date   INSULIN 15.8 12/14/2020   INSULIN 80.9 (H) 03/06/2020   INSULIN 29.3 (H) 11/10/2019    Plan: Continue and refill Metformin 500 mg once daily breakfast recheck A1C today and CMET Continue working on nutrition plan to decrease simple carbohydrates, increase lean proteins and exercise to promote weight loss, improve glycemic control and prevent progression to Type 2 diabetes.

## 2022-07-30 NOTE — Assessment & Plan Note (Signed)
Taking Synthroid 100 mcg daily. Reports no side effects with Synthroid.  Plan: Refill and continue Synthroid 100 mcg daily. Recheck TSH today

## 2022-07-30 NOTE — Assessment & Plan Note (Signed)
Vitamin D Deficiency Vitamin D is not at goal of 50.  Most recent vitamin D level was 40.3 in 2022. She is on no vitamin D supplementation currently. Recheck level today. Lab Results  Component Value Date   VD25OH 40.3 12/14/2020   VD25OH 17.5 (L) 03/06/2020   VD25OH 18.8 (L) 11/10/2019    Plan:  Will recheck vitamin D level today and supplement as indicated.  Low vitamin D levels can be associated with adiposity and may result in leptin resistance and weight gain. Also associated with fatigue. Currently on vitamin D supplementation without any adverse effects.

## 2022-07-30 NOTE — Assessment & Plan Note (Signed)
Reports history of depression and anxiety. Denies SI/HI . Mood is stable. Needs to re-establish with a PCP and plans to reschedule appointment.   Plan: Continue and refill Zoloft 75 mg daily.  Establish care with new PCP and will follow with PCP.

## 2022-07-30 NOTE — Progress Notes (Signed)
Office: [phone]  /  Fax: [phone]  WEIGHT SUMMARY AND BIOMETRICS  Vitals Temp: 98.9 F (37.2 C) BP: 119/77 Pulse Rate: 63 SpO2: 99 %   Anthropometric Measurements Height: 6\' 1"  (1.854 m) Weight: 250 lb (113.4 kg) BMI (Calculated): 32.99 Weight at Last Visit: 249lb Weight Lost Since Last Visit: 0 Weight Gained Since Last Visit: 1lb Starting Weight: 312lb Total Weight Loss (lbs): 18 lb (8.165 kg)   Body Composition  Body Fat %: 40.3 % Fat Mass (lbs): 100.8 lbs Muscle Mass (lbs): 141.6 lbs Total Body Water (lbs): 101.8 lbs Visceral Fat Rating : 7   Other Clinical Data Fasting: no Labs: No Today's Visit #: 20 Starting Date: 11/10/19     HPI  Chief Complaint: OBESITY  Sherry Schmidt is here to discuss her progress with her obesity treatment plan. She is on the keeping a food journal and adhering to recommended goals of 2500 calories and 100+ protein and states she is following her eating plan approximately 75 % of the time. She states she is exercising 60 minutes 5 times per week.   Interval History:  Since last office visit she is up 1 lbs. Last office visit 05/23/2022. Getting back on track with nutrition plan.  Was scheduled to see new PCP but had to reschedule appointment.   Hunger/appetite- controlled when following nutrition plan Exercise- Regularly 1 hr 5 days weekly. Work/Stress- manageable Sleep- some chronic issues, tends to wake up frequently. No daytime somnolence. Provided hand out for sleep hygiene.  Hydration- drinks water regularly, at least 64 oz daily  Pharmacotherapy: metformin for prediabetes. No GI side effects with metformin.  PHYSICAL EXAM:  Blood pressure 119/77, pulse 63, temperature 98.9 F (37.2 C), height 6\' 1"  (1.854 m), weight 250 lb (113.4 kg), SpO2 99 %. Body mass index is 32.98 kg/m.  General: She is overweight, cooperative, alert, well developed, and in no acute distress. PSYCH: Has normal mood, affect and  thought process.   Cardiovascular: HR 60's regular Lungs: Normal breathing effort, no conversational dyspnea. Neuro: no focal deficits  DIAGNOSTIC DATA REVIEWED:  BMET    Component Value Date/Time   NA 138 11/19/2021 0916   NA 140 12/14/2020 1351   K 3.8 11/19/2021 0916   CL 109 11/19/2021 0916   CO2 20 (L) 11/19/2021 0916   GLUCOSE 83 11/19/2021 0916   BUN 11 11/19/2021 0916   BUN 9 12/14/2020 1351   CREATININE 0.69 11/19/2021 0916   CALCIUM 8.3 (L) 11/19/2021 0916   GFRNONAA >60 11/19/2021 0916   GFRAA 141 03/06/2020 1210   Lab Results  Component Value Date   HGBA1C 5.3 12/14/2020   HGBA1C 5.6 11/10/2019   Lab Results  Component Value Date   INSULIN 15.8 12/14/2020   INSULIN 29.3 (H) 11/10/2019   Lab Results  Component Value Date   TSH 2.490 12/14/2020   CBC    Component Value Date/Time   WBC 13.8 (H) 11/19/2021 0916   RBC 4.67 11/19/2021 0916   HGB 13.5 11/19/2021 0916   HGB 15.5 11/10/2019 1426   HCT 41.2 11/19/2021 0916   HCT 47.8 (H) 11/10/2019 1426   PLT 295 11/19/2021 0916   PLT 300 11/10/2019 1426   MCV 88.2 11/19/2021 0916   MCV 90 11/10/2019 1426   MCH 28.9 11/19/2021 0916   MCHC 32.8 11/19/2021 0916   RDW 14.2 11/19/2021 0916   RDW 13.6 11/10/2019 1426   Iron Studies No results found for: "IRON", "TIBC", "FERRITIN", "IRONPCTSAT" Lipid Panel  Component Value Date/Time   CHOL 187 12/14/2020 1351   TRIG 135 12/14/2020 1351   HDL 46 12/14/2020 1351   LDLCALC 117 (H) 12/14/2020 1351   Hepatic Function Panel     Component Value Date/Time   PROT 6.9 11/19/2021 0916   PROT 7.3 12/14/2020 1351   ALBUMIN 3.8 11/19/2021 0916   ALBUMIN 4.5 12/14/2020 1351   AST 14 (L) 11/19/2021 0916   ALT 13 11/19/2021 0916   ALKPHOS 57 11/19/2021 0916   BILITOT 0.6 11/19/2021 0916   BILITOT 0.5 12/14/2020 1351      Component Value Date/Time   TSH 2.490 12/14/2020 1351   Nutritional Lab Results  Component Value Date   VD25OH 40.3 12/14/2020    VD25OH 17.5 (L) 03/06/2020   VD25OH 18.8 (L) 11/10/2019    ASSOCIATED CONDITIONS ADDRESSED TODAY  ASSESSMENT AND PLAN  Problem List Items Addressed This Visit     Vitamin D deficiency    Vitamin D Deficiency Vitamin D is not at goal of 50.  Most recent vitamin D level was 40.3 in 2022. She is on no vitamin D supplementation currently. Recheck level today. Lab Results  Component Value Date   VD25OH 40.3 12/14/2020   VD25OH 17.5 (L) 03/06/2020   VD25OH 18.8 (L) 11/10/2019   Plan:  Will recheck vitamin D level today and supplement as indicated.  Low vitamin D levels can be associated with adiposity and may result in leptin resistance and weight gain. Also associated with fatigue. Currently on vitamin D supplementation without any adverse effects.         Relevant Orders   VITAMIN D 25 Hydroxy (Vit-D Deficiency, Fractures)   Depression with anxiety    Reports history of depression and anxiety. Denies SI/HI . Mood is stable. Needs to re-establish with a PCP and plans to reschedule appointment.   Plan: Continue and refill Zoloft 75 mg daily.  Establish care with new PCP and will follow with PCP.        Relevant Medications   sertraline (ZOLOFT) 50 MG tablet   Hypothyroidism    Taking Synthroid 100 mcg daily. Reports no side effects with Synthroid.  Plan: Refill and continue Synthroid 100 mcg daily. Recheck TSH today      Relevant Medications   levothyroxine (SYNTHROID) 100 MCG tablet   Other Relevant Orders   TSH   Pre-diabetes - Primary    Prediabetes Last A1c was 5.3 at goal.   Medication(s): Metformin 500 mg once daily breakfast No side effects with metformin. Out of medication.  She is working on Engineer, technical sales to decrease simple carbohydrates, increase lean proteins and exercise to promote weight loss, improve glycemic control and prevent progression to Type 2 diabetes.   Lab Results  Component Value Date   HGBA1C 5.3 12/14/2020   HGBA1C 5.4 03/06/2020    HGBA1C 5.6 11/10/2019   Lab Results  Component Value Date   INSULIN 15.8 12/14/2020   INSULIN 80.9 (H) 03/06/2020   INSULIN 29.3 (H) 11/10/2019   Plan: Continue and refill Metformin 500 mg once daily breakfast recheck A1C today and CMET Continue working on nutrition plan to decrease simple carbohydrates, increase lean proteins and exercise to promote weight loss, improve glycemic control and prevent progression to Type 2 diabetes.         Relevant Medications   metFORMIN (GLUCOPHAGE) 500 MG tablet   Other Relevant Orders   CMP14+EGFR   Hemoglobin A1c   BMI 33.0-33.9,adult, Current BMI 33.0   Obesity ([facility])- Start  BMI 41.16   Relevant Medications   metFORMIN (GLUCOPHAGE) 500 MG tablet  Current BMI 33.0  TBW loss 19.9% Down 62 lbs overall.   TREATMENT PLAN FOR OBESITY:  Recommended Dietary Goals  Katreena is currently in the action stage of change. As such, her goal is to continue weight management plan. She has agreed to keeping a food journal and adhering to recommended goals of 2500 calories and 100+grams of protein.  Behavioral Intervention  We discussed the following Behavioral Modification Strategies today: increasing lean protein intake, decreasing simple carbohydrates , increasing vegetables, increasing lower glycemic fruits, increasing water intake, work on tracking and journaling calories using tracking application, emotional eating strategies and understanding the difference between hunger signals and cravings, continue to practice mindfulness when eating, and planning for success.  Additional resources provided today: NA  Recommended Physical Activity Goals  Treonna has been advised to work up to 150 minutes of moderate intensity aerobic activity a week and strengthening exercises 2-3 times per week for cardiovascular health, weight loss maintenance and preservation of muscle mass.   She has agreed to Continue current level of physical activity     Pharmacotherapy We discussed various medication options to help Darlinda with her weight loss efforts and we both agreed to resume metformin for prediabetes and continue to work on nutritional and behavioral strategies to promote weight loss.      Return in about 4 weeks (around 08/27/2022).Marland Kitchen She was informed of the importance of frequent follow up visits to maximize her success with intensive lifestyle modifications for her multiple health conditions.   ATTESTASTION STATEMENTS:  Reviewed by clinician on day of visit: allergies, medications, problem list, medical history, surgical history, family history, social history, and previous encounter notes.   I have personally spent 40 minutes total time today in preparation, patient care, nutritional counseling and documentation for this visit, including the following: review of clinical lab tests; review of medical tests/procedures/services.      Diann Bangerter, PA-C

## 2022-07-31 LAB — CMP14+EGFR
ALT: 16 IU/L (ref 0–32)
AST: 16 IU/L (ref 0–40)
Albumin/Globulin Ratio: 2.1 (ref 1.2–2.2)
Albumin: 4.5 g/dL (ref 4.0–5.0)
Alkaline Phosphatase: 80 IU/L (ref 44–121)
BUN/Creatinine Ratio: 13 (ref 9–23)
BUN: 10 mg/dL (ref 6–20)
Bilirubin Total: 0.4 mg/dL (ref 0.0–1.2)
CO2: 19 mmol/L — ABNORMAL LOW (ref 20–29)
Calcium: 9.7 mg/dL (ref 8.7–10.2)
Chloride: 104 mmol/L (ref 96–106)
Creatinine, Ser: 0.78 mg/dL (ref 0.57–1.00)
Globulin, Total: 2.1 g/dL (ref 1.5–4.5)
Glucose: 84 mg/dL (ref 70–99)
Potassium: 3.8 mmol/L (ref 3.5–5.2)
Sodium: 140 mmol/L (ref 134–144)
Total Protein: 6.6 g/dL (ref 6.0–8.5)
eGFR: 107 mL/min/1.73 (ref >59)

## 2022-07-31 LAB — HEMOGLOBIN A1C
Est. average glucose Bld gHb Est-mCnc: 108 mg/dL
Hgb A1c MFr Bld: 5.4 % (ref 4.8–5.6)

## 2022-07-31 LAB — TSH: TSH: 3.87 u[IU]/mL (ref 0.450–4.500)

## 2022-07-31 LAB — VITAMIN D 25 HYDROXY (VIT D DEFICIENCY, FRACTURES): Vit D, 25-Hydroxy: 36.7 ng/mL (ref 30.0–100.0)

## 2022-08-12 ENCOUNTER — Ambulatory Visit (HOSPITAL_COMMUNITY)
Admission: EM | Admit: 2022-08-12 | Discharge: 2022-08-12 | Disposition: A | Discharge status: Home / Self Care | Payer: 59 | Attending: Internal Medicine | Admitting: Internal Medicine

## 2022-08-12 ENCOUNTER — Encounter (HOSPITAL_COMMUNITY): Payer: Self-pay | Admitting: Emergency Medicine

## 2022-08-12 DIAGNOSIS — M545 Low back pain, unspecified: Secondary | ICD-10-CM | POA: Diagnosis not present

## 2022-08-12 MED ORDER — KETOROLAC TROMETHAMINE 30 MG/ML IJ SOLN
30.0000 mg | Freq: Once | INTRAMUSCULAR | Status: AC
  Administered 2022-08-12: 30 mg via INTRAMUSCULAR

## 2022-08-12 MED ORDER — KETOROLAC TROMETHAMINE 30 MG/ML IJ SOLN
INTRAMUSCULAR | Status: AC
  Filled 2022-08-12: qty 1

## 2022-08-12 MED ORDER — METHOCARBAMOL 500 MG PO TABS: 500.0000 mg | ORAL_TABLET | Freq: Two times a day (BID) | ORAL | 0 refills | Status: DC | PRN

## 2022-08-12 NOTE — ED Triage Notes (Signed)
Pt c/o lower back pain since Friday. Reports using heat and taking Ibuprofen. Denies urinary or bowel problems. Denies lifting injury or fall.

## 2022-08-12 NOTE — Discharge Instructions (Signed)
I have prescribed you a muscle relaxer to take as needed.  Please remember that the muscle relaxer can make you drowsy so no driving or drink alcohol while taking it.  Alternate ice and heat to affected area of back.  An injection was given today for pain.  Do not take any ibuprofen, Advil, Aleve for at least 24 hours following injection.  I have also provided you with contact information for Ortho so you can follow-up given this is a recurrent issue for you.

## 2022-08-12 NOTE — ED Provider Notes (Signed)
[facility]    CSN: [id number] Arrival date & time: 08/12/22  0802      History   Chief Complaint Chief Complaint  Patient presents with   Back Pain    HPI Sherry Schmidt is a 27 y.o. female.   Patient presents with lower back pain that started about 4 days ago.  Patient denies any obvious injury.  Reports the back pain is across the lower back.  Movement exacerbates pain.  Pain does not radiate down legs.  Denies numbness, tingling.  Denies urinary frequency, urinary or bowel continence, saddle anesthesia.  Reports that she has a history of recurrent back pain in that area.  She states that she had an MRI approximately 1 year ago but they could not find any abnormalities.  She has never seen a specialist prior.  Has taken ibuprofen with last dose being last night with no improvement in pain.   Back Pain   Past Medical History:  Diagnosis Date   Asthma    Back pain    Back pain    Depression    Panic attacks    Thyroid disease     Patient Active Problem List   Diagnosis Date Noted   BMI 33.0-33.9,adult, Current BMI 33.0 05/23/2022   Obesity (HCC)- Start BMI 41.16 05/23/2022   Pre-diabetes 09/27/2021   Hypothyroidism 05/12/2020   Transaminitis 03/06/2020   Abnormal thyroid blood test 03/06/2020   Mixed hyperlipidemia 03/06/2020   Vitamin D deficiency 02/18/2020   Insulin resistance 02/18/2020   Depression with anxiety 02/18/2020   Class 3 severe obesity with serious comorbidity and body mass index (BMI) of 40.0 to 44.9 in adult [facility]) 02/18/2020   Fatigue 11/06/2016   Postprocedural hypothyroidism 10/16/2016    Past Surgical History:  Procedure Laterality Date   THYROIDECTOMY      OB History     Gravida  0   Para  0   Term  0   Preterm  0   AB  0   Living  0      SAB  0   IAB  0   Ectopic  0   Multiple  0   Live Births  0            Home Medications    Prior to Admission medications   Medication Sig Start Date  End Date Taking? Authorizing Provider  methocarbamol (ROBAXIN) 500 MG tablet Take 1 tablet (500 mg total) by mouth 2 (two) times daily as needed for muscle spasms. 08/12/22  Yes [facility], Acie Fredrickson, FNP  albuterol (VENTOLIN HFA) 108 (90 Base) MCG/ACT inhaler Inhale 2 puffs into the lungs every 4 (four) hours as needed for wheezing or shortness of breath. 07/04/22   Zenia Resides, MD  levothyroxine (SYNTHROID) 100 MCG tablet Take 1 tablet (100 mcg total) by mouth daily before breakfast. 07/30/22   Rayburn, Fanny Bien, PA-C  metFORMIN (GLUCOPHAGE) 500 MG tablet 1 with breakfast 07/30/22   Rayburn, Fanny Bien, PA-C  sertraline (ZOLOFT) 50 MG tablet Take 1.5 tablets (75 mg total) by mouth daily. Take 1.5 tablets daily 07/30/22   Rayburn, Fanny Bien, PA-C  Vitamin D, Ergocalciferol, (DRISDOL) 1.25 MG (50000 UNIT) CAPS capsule Take 1 capsule (50,000 Units total) by mouth every 3 (three) days. 08/14/20   Alois Cliche, PA-C    Family History Family History  Problem Relation Age of Onset   Hypertension Mother    Depression Mother    Bipolar disorder Mother  Drug abuse Mother    Obesity Mother    Diabetes Father    Thyroid disease Father    Cancer Father    Depression Father    Anxiety disorder Father    Bipolar disorder Father    Schizophrenia Father    Liver disease Father    Sleep apnea Father    Alcohol abuse Father    Drug abuse Father    Obesity Father     Social History Social History   Tobacco Use   Smoking status: Never   Smokeless tobacco: Never  Vaping Use   Vaping Use: Never used  Substance Use Topics   Alcohol use: Yes    Comment: occasional   Drug use: No     Allergies   Patient has no known allergies.   Review of Systems Review of Systems Per HPI  Physical Exam Triage Vital Signs ED Triage Vitals  Enc Vitals Group     BP 08/12/22 0824 122/81     Pulse Rate 08/12/22 0824 68     Resp 08/12/22 0824 16     Temp 08/12/22 0824 99.3 F (37.4  C)     Temp Source 08/12/22 0824 Oral     SpO2 08/12/22 0824 98 %     Weight --      Height --      Head Circumference --      Peak Flow --      Pain Score 08/12/22 0823 1     Pain Loc --      Pain Edu? --      Excl. in GC? --    No data found.  Updated Vital Signs BP 122/81 (BP Location: Right Arm)   Pulse 68   Temp 99.3 F (37.4 C) (Oral)   Resp 16   LMP 08/08/2022   SpO2 98%   Visual Acuity Right Eye Distance:   Left Eye Distance:   Bilateral Distance:    Right Eye Near:   Left Eye Near:    Bilateral Near:     Physical Exam Constitutional:      General: She is not in acute distress.    Appearance: Normal appearance. She is not toxic-appearing.  HENT:     Head: Normocephalic and atraumatic.  Eyes:     Extraocular Movements: Extraocular movements intact.     Conjunctiva/sclera: Conjunctivae normal.  Pulmonary:     Effort: Pulmonary effort is normal.  Musculoskeletal:       Back:     Comments: Patient has tenderness to palpation across lower lumbar region.  There is no obvious crepitus or step-off noted.  No swelling or discoloration noted.  Neurological:     General: No focal deficit present.     Mental Status: She is alert and oriented to person, place, and time. Mental status is at baseline.     Deep Tendon Reflexes: Reflexes are normal and symmetric.  Psychiatric:        Mood and Affect: Mood normal.        Behavior: Behavior normal.        Thought Content: Thought content normal.        Judgment: Judgment normal.      UC Treatments / Results  Labs (all labs ordered are listed, but only abnormal results are displayed) Labs Reviewed - No data to display  EKG   Radiology No results found.  Procedures Procedures (including critical care time)  Medications Ordered in UC Medications  ketorolac (TORADOL) 30  MG/ML injection 30 mg (30 mg Intramuscular Given 08/12/22 0850)    Initial Impression / Assessment and Plan / UC Course  I have  reviewed the triage vital signs and the nursing notes.  Pertinent labs & imaging results that were available during my care of the patient were reviewed by me and considered in my medical decision making (see chart for details).     Suspect musculoskeletal etiology.  No concern for UTI given no urinary symptoms and given this is a recurrent issue for patient.  Recommend to patient that she see Ortho at provided contact information given this is a recurrent issue.  Will prescribe muscle relaxer for patient to start taking today.  Patient does take sertraline but encouraged her to take these separately and that this medication can make her drowsy.  Advised no driving or drinking alcohol with taking this medication.  Advised supportive care including alternating ice and heat to affected area of back.  IM Toradol administered today for pain.  Patient advised to not take any additional NSAIDs for at least 24 hours.  Advised strict follow-up.  Patient presents verbalized understanding and was agreeable with plan. Final Clinical Impressions(s) / UC Diagnoses   Final diagnoses:  Acute bilateral low back pain without sciatica     Discharge Instructions      I have prescribed you a muscle relaxer to take as needed.  Please remember that the muscle relaxer can make you drowsy so no driving or drink alcohol while taking it.  Alternate ice and heat to affected area of back.  An injection was given today for pain.  Do not take any ibuprofen, Advil, Aleve for at least 24 hours following injection.  I have also provided you with contact information for Ortho so you can follow-up given this is a recurrent issue for you.    ED Prescriptions     Medication Sig Dispense Auth. Provider   methocarbamol (ROBAXIN) 500 MG tablet Take 1 tablet (500 mg total) by mouth 2 (two) times daily as needed for muscle spasms. 20 tablet [place], Acie Fredrickson, [place]      PDMP not reviewed this encounter.   Gustavus Bryant,  [place] 08/12/22 [phone]

## 2022-08-27 ENCOUNTER — Ambulatory Visit (INDEPENDENT_AMBULATORY_CARE_PROVIDER_SITE_OTHER): Payer: 59 | Admitting: Physician Assistant

## 2022-09-10 ENCOUNTER — Encounter (HOSPITAL_COMMUNITY): Payer: Self-pay | Admitting: Emergency Medicine

## 2022-09-10 ENCOUNTER — Ambulatory Visit (HOSPITAL_COMMUNITY)
Admission: EM | Admit: 2022-09-10 | Discharge: 2022-09-10 | Disposition: A | Discharge status: Home / Self Care | Payer: 59 | Attending: Emergency Medicine | Admitting: Emergency Medicine

## 2022-09-10 DIAGNOSIS — K529 Noninfective gastroenteritis and colitis, unspecified: Secondary | ICD-10-CM

## 2022-09-10 LAB — POCT URINE PREGNANCY: Preg Test, Ur: NEGATIVE

## 2022-09-10 MED ORDER — ONDANSETRON 4 MG PO TBDP
ORAL_TABLET | ORAL | Status: AC
  Filled 2022-09-10: qty 1

## 2022-09-10 MED ORDER — ONDANSETRON 4 MG PO TBDP: 4.0000 mg | ORAL_TABLET | Freq: Four times a day (QID) | ORAL | 0 refills | Status: DC | PRN

## 2022-09-10 MED ORDER — ONDANSETRON 4 MG PO TBDP
4.0000 mg | ORAL_TABLET | Freq: Once | ORAL | Status: AC
  Administered 2022-09-10: 4 mg via ORAL

## 2022-09-10 NOTE — ED Provider Notes (Signed)
[facility]    CSN: [id number] Arrival date & time: 09/10/22  0807      History   Chief Complaint Chief Complaint  Patient presents with   Emesis   Diarrhea    HPI Sherry Schmidt is a 27 y.o. female.  Last night developed nausea, soft stool Vomited twice after trying pepto, once this morning. No fever She is able to tolerate fluids today Not having abdominal pain.  No urinary symptoms No known sick contacts but works around lots of people   LMP 5/11  Past Medical History:  Diagnosis Date   Asthma    Back pain    Back pain    Depression    Panic attacks    Thyroid disease     Patient Active Problem List   Diagnosis Date Noted   BMI 33.0-33.9,adult, Current BMI 33.0 05/23/2022   Obesity (HCC)- Start BMI 41.16 05/23/2022   Pre-diabetes 09/27/2021   Hypothyroidism 05/12/2020   Transaminitis 03/06/2020   Abnormal thyroid blood test 03/06/2020   Mixed hyperlipidemia 03/06/2020   Vitamin D deficiency 02/18/2020   Insulin resistance 02/18/2020   Depression with anxiety 02/18/2020   Class 3 severe obesity with serious comorbidity and body mass index (BMI) of 40.0 to 44.9 in adult (HCC) 02/18/2020   Fatigue 11/06/2016   Postprocedural hypothyroidism 10/16/2016    Past Surgical History:  Procedure Laterality Date   THYROIDECTOMY      OB History     Gravida  0   Para  0   Term  0   Preterm  0   AB  0   Living  0      SAB  0   IAB  0   Ectopic  0   Multiple  0   Live Births  0            Home Medications    Prior to Admission medications   Medication Sig Start Date End Date Taking? Authorizing Provider  ondansetron (ZOFRAN-ODT) 4 MG disintegrating tablet Take 1 tablet (4 mg total) by mouth every 6 (six) hours as needed for nausea or vomiting. 09/10/22  Yes Deztiny Sarra, Lurena Joiner, PA-C  albuterol (VENTOLIN HFA) 108 (90 Base) MCG/ACT inhaler Inhale 2 puffs into the lungs every 4 (four) hours as needed for wheezing or shortness  of breath. 07/04/22   Zenia Resides, MD  levothyroxine (SYNTHROID) 100 MCG tablet Take 1 tablet (100 mcg total) by mouth daily before breakfast. 07/30/22   Rayburn, Fanny Bien, PA-C  metFORMIN (GLUCOPHAGE) 500 MG tablet 1 with breakfast 07/30/22   Rayburn, Fanny Bien, PA-C  methocarbamol (ROBAXIN) 500 MG tablet Take 1 tablet (500 mg total) by mouth 2 (two) times daily as needed for muscle spasms. 08/12/22   Gustavus Bryant, FNP  sertraline (ZOLOFT) 50 MG tablet Take 1.5 tablets (75 mg total) by mouth daily. Take 1.5 tablets daily 07/30/22   Rayburn, Fanny Bien, PA-C  Vitamin D, Ergocalciferol, (DRISDOL) 1.25 MG (50000 UNIT) CAPS capsule Take 1 capsule (50,000 Units total) by mouth every 3 (three) days. 08/14/20   Alois Cliche, PA-C    Family History Family History  Problem Relation Age of Onset   Hypertension Mother    Depression Mother    Bipolar disorder Mother    Drug abuse Mother    Obesity Mother    Diabetes Father    Thyroid disease Father    Cancer Father    Depression Father    Anxiety disorder Father  Bipolar disorder Father    Schizophrenia Father    Liver disease Father    Sleep apnea Father    Alcohol abuse Father    Drug abuse Father    Obesity Father     Social History Social History   Tobacco Use   Smoking status: Never   Smokeless tobacco: Never  Vaping Use   Vaping Use: Never used  Substance Use Topics   Alcohol use: Yes    Comment: occasional   Drug use: No     Allergies   Patient has no known allergies.   Review of Systems Review of Systems As per HPI  Physical Exam Triage Vital Signs ED Triage Vitals  Enc Vitals Group     BP 09/10/22 0842 113/78     Pulse Rate 09/10/22 0842 65     Resp 09/10/22 0842 16     Temp 09/10/22 0842 98.7 F (37.1 C)     Temp src --      SpO2 09/10/22 0842 98 %     Weight --      Height --      Head Circumference --      Peak Flow --      Pain Score 09/10/22 0840 0     Pain Loc --       Pain Edu? --      Excl. in GC? --    No data found.  Updated Vital Signs BP 113/78 (BP Location: Right Arm)   Pulse 65   Temp 98.7 F (37.1 C)   Resp 16   LMP 08/10/2022   SpO2 98%    Physical Exam Vitals and nursing note reviewed.  Constitutional:      Appearance: Normal appearance.  HENT:     Mouth/Throat:     Mouth: Mucous membranes are moist.     Pharynx: Oropharynx is clear.  Eyes:     Conjunctiva/sclera: Conjunctivae normal.  Cardiovascular:     Rate and Rhythm: Normal rate and regular rhythm.     Heart sounds: Normal heart sounds.  Pulmonary:     Effort: Pulmonary effort is normal.     Breath sounds: Normal breath sounds.  Abdominal:     General: Bowel sounds are normal.     Palpations: Abdomen is soft.     Tenderness: There is no abdominal tenderness. There is no right CVA tenderness, left CVA tenderness, guarding or rebound.  Musculoskeletal:        General: Normal range of motion.  Skin:    General: Skin is warm and dry.  Neurological:     Mental Status: She is alert and oriented to person, place, and time.     UC Treatments / Results  Labs (all labs ordered are listed, but only abnormal results are displayed) Labs Reviewed  POCT URINE PREGNANCY    EKG  Radiology No results found.  Procedures Procedures   Medications Ordered in UC Medications  ondansetron (ZOFRAN-ODT) disintegrating tablet 4 mg (4 mg Oral Given 09/10/22 0907)    Initial Impression / Assessment and Plan / UC Course  I have reviewed the triage vital signs and the nursing notes.  Pertinent labs & imaging results that were available during my care of the patient were reviewed by me and considered in my medical decision making (see chart for details).  UPT negative Zofran given in clinic with improvement in nausea. Afebrile, well-appearing, tolerating fluids.  Discussed could be stomach bug.  Recommend increasing fluids, bland diet, Zofran  q6 hours prn Work note  provided Return precaution discussed.  Final Clinical Impressions(s) / UC Diagnoses   Final diagnoses:  Gastroenteritis     Discharge Instructions      Make sure you are keeping hydrated and drinking lots of fluids. Try bland diet for the next few days (crackers, toast, etc.) You can use the Zofran every 6 hours as needed for nausea.  Please return if needed     ED Prescriptions     Medication Sig Dispense Auth. Provider   ondansetron (ZOFRAN-ODT) 4 MG disintegrating tablet Take 1 tablet (4 mg total) by mouth every 6 (six) hours as needed for nausea or vomiting. 20 tablet Adom Scho[place], Lurena Joiner, PA-C      PDMP not reviewed this encounter.   Camrynn Mcclintic, [place] 09/10/22 [phone]

## 2022-09-10 NOTE — ED Triage Notes (Signed)
Pt reports since last night have n/v/d, chills. When tried to take Pepto Bismol, made vomit.

## 2022-09-10 NOTE — Discharge Instructions (Addendum)
Make sure you are keeping hydrated and drinking lots of fluids. Try bland diet for the next few days (crackers, toast, etc.) You can use the Zofran every 6 hours as needed for nausea.  Please return if needed

## 2022-09-19 ENCOUNTER — Encounter (INDEPENDENT_AMBULATORY_CARE_PROVIDER_SITE_OTHER): Payer: Self-pay | Admitting: Family Medicine

## 2022-09-19 ENCOUNTER — Ambulatory Visit (INDEPENDENT_AMBULATORY_CARE_PROVIDER_SITE_OTHER): Payer: 59 | Admitting: Family Medicine

## 2022-09-19 VITALS — BP 97/94 | HR 66 | Temp 98.6°F | Ht 73.0 in | Wt 244.0 lb

## 2022-09-19 DIAGNOSIS — E669 Obesity, unspecified: Secondary | ICD-10-CM

## 2022-09-19 DIAGNOSIS — E88819 Insulin resistance, unspecified: Secondary | ICD-10-CM

## 2022-09-19 DIAGNOSIS — R7303 Prediabetes: Secondary | ICD-10-CM

## 2022-09-19 DIAGNOSIS — E038 Other specified hypothyroidism: Secondary | ICD-10-CM | POA: Diagnosis not present

## 2022-09-19 DIAGNOSIS — F418 Other specified anxiety disorders: Secondary | ICD-10-CM

## 2022-09-19 DIAGNOSIS — Z6832 Body mass index (BMI) 32.0-32.9, adult: Secondary | ICD-10-CM

## 2022-09-19 MED ORDER — LEVOTHYROXINE SODIUM 100 MCG PO TABS: 100.0000 ug | ORAL_TABLET | Freq: Every day | ORAL | 0 refills | Status: DC

## 2022-09-19 MED ORDER — SERTRALINE HCL 50 MG PO TABS: 100.0000 mg | ORAL_TABLET | Freq: Every day | ORAL | 0 refills | Status: DC

## 2022-09-19 MED ORDER — METFORMIN HCL 500 MG PO TABS: ORAL_TABLET | ORAL | 0 refills | Status: DC

## 2022-09-19 NOTE — Progress Notes (Unsigned)
Chief Complaint:   OBESITY Sherry Schmidt is here to discuss her progress with her obesity treatment plan along with follow-up of her obesity related diagnoses. Sherry Schmidt is on keeping a food journal and adhering to recommended goals of 2500 calories and 100+ protein and states she is following her eating plan approximately 50% of the time. Sherry Schmidt states she is doing cardio and weights for 60 minutes 5 times per week.  Today's visit was #: 21 Starting weight: 312 lb Starting date: 11/10/2019 Today's weight: 244 lb Today's date: 09/19/2022 Total lbs lost to date: 68 lb Total lbs lost since last in-office visit: 6 lb  Interim History: Patient has been mostly working and going to the gym. She is logging around 2900 calories a day and gets around 40g of protein.  She was not aware of total protein intake goal.  She is going camping the week of 4th of July and will be otherwise working and going to the gym.   Subjective:   1. Other specified hypothyroidism Patient is on levothyroxine daily.  Last TSH was 3.87.  2. Insulin resistance Patient is on metformin daily.  Patient denies GI side effects.  3. Depression with anxiety Patient is on sertraline daily, but not feeling the effects as much.  Assessment/Plan:   1. Other specified hypothyroidism Refill- levothyroxine (SYNTHROID) 100 MCG tablet; Take 1 tablet (100 mcg total) by mouth daily before breakfast.  Dispense: 30 tablet; Refill: 0  2. Insulin resistance Refill- metFORMIN (GLUCOPHAGE) 500 MG tablet; 1 with breakfast  Dispense: 30 tablet; Refill: 0  3. Depression with anxiety Increase- sertraline (ZOLOFT) 50 MG tablet; Take 2 tablets (100 mg total) by mouth daily. Take 1.5 tablets daily  Dispense: 60 tablet; Refill: 0  4. BMI 32.0-32.9,adult  5. Obesity, starting BMI 41.16 Sherry Schmidt is currently in the action stage of change. As such, her goal is to continue with weight loss efforts. She has agreed to keeping a food journal  and adhering to recommended goals of 2900 calories and 150+ protein daily.   Exercise goals: As is.  Behavioral modification strategies: increasing lean protein intake, meal planning and cooking strategies, keeping healthy foods in the home, and planning for success.  Sherry Schmidt has agreed to follow-up with our clinic in 5 weeks. She was informed of the importance of frequent follow-up visits to maximize her success with intensive lifestyle modifications for her multiple health conditions.   Objective:   Blood pressure (!) 97/94, pulse 66, temperature 98.6 F (37 C), height 6\' 1"  (1.854 m), weight 244 lb (110.7 kg), last menstrual period 08/10/2022, SpO2 98 %. Body mass index is 32.19 kg/m.  General: Cooperative, alert, well developed, in no acute distress. HEENT: Conjunctivae and lids unremarkable. Cardiovascular: Regular rhythm.  Lungs: Normal work of breathing. Neurologic: No focal deficits.   Lab Results  Component Value Date   CREATININE 0.78 07/30/2022   BUN 10 07/30/2022   NA 140 07/30/2022   K 3.8 07/30/2022   CL 104 07/30/2022   CO2 19 (L) 07/30/2022   Lab Results  Component Value Date   ALT 16 07/30/2022   AST 16 07/30/2022   ALKPHOS 80 07/30/2022   BILITOT 0.4 07/30/2022   Lab Results  Component Value Date   HGBA1C 5.4 07/30/2022   HGBA1C 5.3 12/14/2020   HGBA1C 5.4 03/06/2020   HGBA1C 5.6 11/10/2019   Lab Results  Component Value Date   INSULIN 15.8 12/14/2020   INSULIN 80.9 (H) 03/06/2020   INSULIN 29.3 (H)  11/10/2019   Lab Results  Component Value Date   TSH 3.870 07/30/2022   Lab Results  Component Value Date   CHOL 187 12/14/2020   HDL 46 12/14/2020   LDLCALC 117 (H) 12/14/2020   TRIG 135 12/14/2020   Lab Results  Component Value Date   VD25OH 36.7 07/30/2022   VD25OH 40.3 12/14/2020   VD25OH 17.5 (L) 03/06/2020   Lab Results  Component Value Date   WBC 13.8 (H) 11/19/2021   HGB 13.5 11/19/2021   HCT 41.2 11/19/2021   MCV 88.2  11/19/2021   PLT 295 11/19/2021   No results found for: "IRON", "TIBC", "FERRITIN"  Attestation Statements:   Reviewed by clinician on day of visit: allergies, medications, problem list, medical history, surgical history, family history, social history, and previous encounter notes.  I, Malcolm Metro, RMA, am acting as transcriptionist for Reuben Likes, MD.  I have reviewed the above documentation for accuracy and completeness, and I agree with the above. - Reuben Likes, MD

## 2022-10-15 ENCOUNTER — Ambulatory Visit (HOSPITAL_COMMUNITY): Payer: 59

## 2022-10-15 ENCOUNTER — Encounter (HOSPITAL_COMMUNITY): Payer: Self-pay

## 2022-10-15 ENCOUNTER — Ambulatory Visit (HOSPITAL_COMMUNITY)
Admission: RE | Admit: 2022-10-15 | Discharge: 2022-10-15 | Disposition: A | Discharge status: Home / Self Care | Payer: 59 | Source: Ambulatory Visit | Attending: Family Medicine | Admitting: Family Medicine

## 2022-10-15 VITALS — BP 101/69 | HR 65 | Temp 98.4°F | Resp 17

## 2022-10-15 DIAGNOSIS — R079 Chest pain, unspecified: Secondary | ICD-10-CM

## 2022-10-15 DIAGNOSIS — R251 Tremor, unspecified: Secondary | ICD-10-CM | POA: Diagnosis present

## 2022-10-15 DIAGNOSIS — F419 Anxiety disorder, unspecified: Secondary | ICD-10-CM | POA: Diagnosis not present

## 2022-10-15 LAB — BASIC METABOLIC PANEL
Anion gap: 11 (ref 5–15)
BUN: 17 mg/dL (ref 6–20)
CO2: 21 mmol/L — ABNORMAL LOW (ref 22–32)
Calcium: 8.5 mg/dL — ABNORMAL LOW (ref 8.9–10.3)
Chloride: 106 mmol/L (ref 98–111)
Creatinine, Ser: 0.78 mg/dL (ref 0.44–1.00)
GFR, Estimated: >60 mL/min (ref >60)
Glucose, Bld: 88 mg/dL (ref 70–99)
Potassium: 4.2 mmol/L (ref 3.5–5.1)
Sodium: 138 mmol/L (ref 135–145)

## 2022-10-15 LAB — CBC
HCT: 43 % (ref 36.0–46.0)
Hemoglobin: 14.7 g/dL (ref 12.0–15.0)
MCH: 31.1 pg (ref 26.0–34.0)
MCHC: 34.2 g/dL (ref 30.0–36.0)
MCV: 90.9 fL (ref 80.0–100.0)
Platelets: 310 10*3/uL (ref 150–400)
RBC: 4.73 MIL/uL (ref 3.87–5.11)
RDW: 14 % (ref 11.5–15.5)
WBC: 12.2 10*3/uL — ABNORMAL HIGH (ref 4.0–10.5)
nRBC: 0 % (ref 0.0–0.2)

## 2022-10-15 LAB — TSH: TSH: 5.291 u[IU]/mL — ABNORMAL HIGH (ref 0.350–4.500)

## 2022-10-15 MED ORDER — HYDROXYZINE HCL 10 MG PO TABS: 10.0000 mg | ORAL_TABLET | Freq: Two times a day (BID) | ORAL | 0 refills | Status: DC | PRN

## 2022-10-15 NOTE — ED Triage Notes (Signed)
Pt reports since yesterday having burning in chest and anxiousness. Burning is intermittent. Denies any at this time.

## 2022-10-15 NOTE — ED Provider Notes (Signed)
[facility]    CSN: [id number] Arrival date & time: 10/15/22  1610      History   Chief Complaint Chief Complaint  Patient presents with   Anxiety    Very hard time breathing chest feels like it's burning - Entered by patient    HPI Brileigh Below is a 27 y.o. female.    Anxiety  Here for feeling like she cannot relax and chest tightness and burning.  Since yesterday morning she has been having intermittent chest tightness, "feeling like I cannot relax".  It will last for about 5 minutes.  When its at its worst she will have a burning in her chest.  She states it does not feel like her asthma trouble, and she did try her albuterol, and that did not help.  No fever or cough or congestion.  No burping or sour waterbrash  Past Medical History:  Diagnosis Date   Asthma    Back pain    Back pain    Depression    Panic attacks    Thyroid disease     Patient Active Problem List   Diagnosis Date Noted   BMI 33.0-33.9,adult, Current BMI 33.0 05/23/2022   Obesity (HCC)- Start BMI 41.16 05/23/2022   Pre-diabetes 09/27/2021   Hypothyroidism 05/12/2020   Transaminitis 03/06/2020   Abnormal thyroid blood test 03/06/2020   Mixed hyperlipidemia 03/06/2020   Vitamin D deficiency 02/18/2020   Insulin resistance 02/18/2020   Depression with anxiety 02/18/2020   Class 3 severe obesity with serious comorbidity and body mass index (BMI) of 40.0 to 44.9 in adult [facility]) 02/18/2020   Fatigue 11/06/2016   Postprocedural hypothyroidism 10/16/2016    Past Surgical History:  Procedure Laterality Date   THYROIDECTOMY      OB History     Gravida  0   Para  0   Term  0   Preterm  0   AB  0   Living  0      SAB  0   IAB  0   Ectopic  0   Multiple  0   Live Births  0            Home Medications    Prior to Admission medications   Medication Sig Start Date End Date Taking? Authorizing Provider  hydrOXYzine (ATARAX) 10 MG tablet Take 1  tablet (10 mg total) by mouth 2 (two) times daily as needed for anxiety. 10/15/22  Yes Zenia Resides, MD  albuterol (VENTOLIN HFA) 108 (90 Base) MCG/ACT inhaler Inhale 2 puffs into the lungs every 4 (four) hours as needed for wheezing or shortness of breath. 07/04/22   Zenia Resides, MD  levothyroxine (SYNTHROID) 100 MCG tablet Take 1 tablet (100 mcg total) by mouth daily before breakfast. 09/19/22   Langston Reusing, MD  metFORMIN (GLUCOPHAGE) 500 MG tablet 1 with breakfast 09/19/22   Langston Reusing, MD  methocarbamol (ROBAXIN) 500 MG tablet Take 1 tablet (500 mg total) by mouth 2 (two) times daily as needed for muscle spasms. 08/12/22   Gustavus Bryant, FNP  ondansetron (ZOFRAN-ODT) 4 MG disintegrating tablet Take 1 tablet (4 mg total) by mouth every 6 (six) hours as needed for nausea or vomiting. 09/10/22   Rising, Lurena Joiner, PA-C  sertraline (ZOLOFT) 50 MG tablet Take 2 tablets (100 mg total) by mouth daily. Take 1.5 tablets daily 09/19/22   Langston Reusing, MD  Vitamin D, Ergocalciferol, (DRISDOL) 1.25 MG (50000 UNIT) CAPS capsule  Take 1 capsule (50,000 Units total) by mouth every 3 (three) days. 08/14/20   Alois Cliche, PA-C    Family History Family History  Problem Relation Age of Onset   Hypertension Mother    Depression Mother    Bipolar disorder Mother    Drug abuse Mother    Obesity Mother    Diabetes Father    Thyroid disease Father    Cancer Father    Depression Father    Anxiety disorder Father    Bipolar disorder Father    Schizophrenia Father    Liver disease Father    Sleep apnea Father    Alcohol abuse Father    Drug abuse Father    Obesity Father     Social History Social History   Tobacco Use   Smoking status: Never   Smokeless tobacco: Never  Vaping Use   Vaping status: Never Used  Substance Use Topics   Alcohol use: Yes    Comment: occasional   Drug use: No     Allergies   Patient has no known allergies.   Review of  Systems Review of Systems   Physical Exam Triage Vital Signs ED Triage Vitals  Encounter Vitals Group     BP 10/15/22 1644 101/69     Systolic BP Percentile --      Diastolic BP Percentile --      Pulse Rate 10/15/22 1644 65     Resp 10/15/22 1644 17     Temp 10/15/22 1644 98.4 F (36.9 C)     Temp Source 10/15/22 1644 Oral     SpO2 10/15/22 1644 97 %     Weight --      Height --      Head Circumference --      Peak Flow --      Pain Score 10/15/22 1643 0     Pain Loc --      Pain Education --      Exclude from Growth Chart --    No data found.  Updated Vital Signs BP 101/69 (BP Location: Left Arm)   Pulse 65   Temp 98.4 F (36.9 C) (Oral)   Resp 17   LMP 10/07/2022   SpO2 97%   Visual Acuity Right Eye Distance:   Left Eye Distance:   Bilateral Distance:    Right Eye Near:   Left Eye Near:    Bilateral Near:     Physical Exam Vitals reviewed.  Constitutional:      General: She is not in acute distress.    Appearance: She is not ill-appearing, toxic-appearing or diaphoretic.  HENT:     Mouth/Throat:     Mouth: Mucous membranes are moist.     Pharynx: No oropharyngeal exudate or posterior oropharyngeal erythema.  Eyes:     Extraocular Movements: Extraocular movements intact.     Conjunctiva/sclera: Conjunctivae normal.     Pupils: Pupils are equal, round, and reactive to light.  Cardiovascular:     Rate and Rhythm: Normal rate and regular rhythm.     Heart sounds: No murmur heard. Pulmonary:     Effort: Pulmonary effort is normal. No respiratory distress.     Breath sounds: Normal breath sounds. No stridor. No wheezing, rhonchi or rales.  Chest:     Chest wall: No tenderness.  Musculoskeletal:     Cervical back: Neck supple.  Lymphadenopathy:     Cervical: No cervical adenopathy.  Skin:    Coloration: Skin is not  jaundiced or pale.  Neurological:     General: No focal deficit present.     Mental Status: She is alert and oriented to person,  place, and time.  Psychiatric:        Behavior: Behavior normal.      UC Treatments / Results  Labs (all labs ordered are listed, but only abnormal results are displayed) Labs Reviewed  BASIC METABOLIC PANEL  CBC  TSH    EKG   Radiology DG Chest 2 View  Result Date: 10/15/2022 CLINICAL DATA:  Chest burning and tightness EXAM: CHEST - 2 VIEW COMPARISON:  09/05/2021 FINDINGS: The heart size and mediastinal contours are within normal limits. Both lungs are clear. The visualized skeletal structures are unremarkable. IMPRESSION: No active cardiopulmonary disease. Electronically Signed   By: Jasmine Pang M.D.   On: 10/15/2022 17:25    Procedures Procedures (including critical care time)  Medications Ordered in UC Medications - No data to display  Initial Impression / Assessment and Plan / UC Course  I have reviewed the triage vital signs and the nursing notes.  Pertinent labs & imaging results that were available during my care of the patient were reviewed by me and considered in my medical decision making (see chart for details).        By my review the x-ray is not changed from 1 done in 2023.  There is no infiltrate or mass.  Diaphragms are not flattened.  Discussed results with the patient.  I did advise her of radiology over read.  BMP, CBC, and TSH are drawn.  Hydroxyzine is sent in to see if that will help her any.  She does note that just recently her Zoloft dose was increased, and she wonders if that might be giving her some side effects.  She will follow-up with her primary care Final Clinical Impressions(s) / UC Diagnoses   Final diagnoses:  Chest pain, unspecified type  Tremor  Anxiety     Discharge Instructions      Your chest x-ray was unchanged compared to previous x-rays.  Radiology will overread the x-ray and we will call you if there is anything abnormal  We have drawn blood to check your blood counts, electrolytes and sugar, and thyroid  function.  Staff will notify you if there is anything significantly abnormal  Hydroxyzine 10 mg-1 tablet every 12 hours as needed for anxiety.  Please follow-up with your primary care about these issues     ED Prescriptions     Medication Sig Dispense Auth. Provider   hydrOXYzine (ATARAX) 10 MG tablet Take 1 tablet (10 mg total) by mouth 2 (two) times daily as needed for anxiety. 10 tablet Marlinda Mike Janace Aris, MD      I have reviewed the PDMP during this encounter.   Zenia Resides, MD 10/15/22 1728

## 2022-10-15 NOTE — Discharge Instructions (Signed)
Your chest x-ray was unchanged compared to previous x-rays.  Radiology will overread the x-ray and we will call you if there is anything abnormal  We have drawn blood to check your blood counts, electrolytes and sugar, and thyroid function.  Staff will notify you if there is anything significantly abnormal  Hydroxyzine 10 mg-1 tablet every 12 hours as needed for anxiety.  Please follow-up with your primary care about these issues

## 2022-10-21 ENCOUNTER — Ambulatory Visit (INDEPENDENT_AMBULATORY_CARE_PROVIDER_SITE_OTHER): Payer: 59 | Admitting: Family Medicine

## 2022-10-21 ENCOUNTER — Encounter (INDEPENDENT_AMBULATORY_CARE_PROVIDER_SITE_OTHER): Payer: Self-pay | Admitting: Family Medicine

## 2022-10-21 VITALS — BP 115/78 | HR 63 | Temp 99.1°F | Ht 73.0 in | Wt 253.0 lb

## 2022-10-21 DIAGNOSIS — E669 Obesity, unspecified: Secondary | ICD-10-CM

## 2022-10-21 DIAGNOSIS — F418 Other specified anxiety disorders: Secondary | ICD-10-CM

## 2022-10-21 DIAGNOSIS — E88819 Insulin resistance, unspecified: Secondary | ICD-10-CM

## 2022-10-21 DIAGNOSIS — Z6833 Body mass index (BMI) 33.0-33.9, adult: Secondary | ICD-10-CM

## 2022-10-21 DIAGNOSIS — E038 Other specified hypothyroidism: Secondary | ICD-10-CM

## 2022-10-21 MED ORDER — METFORMIN HCL 500 MG PO TABS
ORAL_TABLET | ORAL | 0 refills | Status: DC
Start: 2022-10-21 — End: 2023-04-09

## 2022-10-21 MED ORDER — LEVOTHYROXINE SODIUM 100 MCG PO TABS
100.0000 ug | ORAL_TABLET | Freq: Every day | ORAL | 0 refills | Status: DC
Start: 2022-10-21 — End: 2023-05-15

## 2022-10-21 MED ORDER — SERTRALINE HCL 100 MG PO TABS
100.0000 mg | ORAL_TABLET | Freq: Every day | ORAL | 0 refills | Status: DC
Start: 2022-10-21 — End: 2023-04-09

## 2022-10-21 NOTE — Progress Notes (Signed)
Carlye Grippe, D.O.  ABFM, ABOM Specializing in Clinical Bariatric Medicine  Office located at: [address] [place], [place]  [address]     Assessment and Plan:   Medications Discontinued During This Encounter  Medication Reason   sertraline (ZOLOFT) 50 MG tablet Reorder   metFORMIN (GLUCOPHAGE) 500 MG tablet Reorder   levothyroxine (SYNTHROID) 100 MCG tablet Reorder    Meds ordered this encounter  Medications   levothyroxine (SYNTHROID) 100 MCG tablet    Sig: Take 1 tablet (100 mcg total) by mouth daily before breakfast.    Dispense:  90 tablet    Refill:  0   metFORMIN (GLUCOPHAGE) 500 MG tablet    Sig: 1 with breakfast and 1 with lunch daily    Dispense:  180 tablet    Refill:  0   sertraline (ZOLOFT) 100 MG tablet    Sig: Take 1 tablet (100 mg total) by mouth daily.    Dispense:  90 tablet    Refill:  0     Other specified hypothyroidism Assessment: Condition is Not at goal. Her TSH is elevated at 5.291 as of 10/15/2022. She continues to take Synthroid without any difficulty. She endorses that she thinks hair loss is a side effect of this medication but is tolerable. Pt takes her synthorid at the time of all other medications and endorses she was not made aware of how to properly take it.  Lab Results  Component Value Date   TSH 5.291 (H) 10/15/2022   T3TOTAL 88 03/06/2020   T4TOTAL 5.9 03/06/2020   Plan: - Continue Synthroid daily with breakfast consistently as directed by Dr. Marquis Lunch and Dr. Val Eagle. Will refill today.   - I advised her to take this medication by itself and before she eats.   - We will continue to monitor and adjust treatment as deemed clinically necessary.   -Will recheck after 3 months of her consistently taking it.    Insulin resistance Not at goal. Goal is HgbA1c < 5.7, fasting insulin closer to 5. Pt continues Metformin 500mg  1.5 daily with breakfast.  Pt states that the Metformin was helping her cravings at first but the  longer she takes it the more it wears off. She states that she doesn't have much cravings but if she does not eat then she is more prone to fall off plan.  Lab Results  Component Value Date   HGBA1C 5.4 07/30/2022   Lab Results  Component Value Date   INSULIN 15.8 12/14/2020   INSULIN 80.9 (H) 03/06/2020   INSULIN 29.3 (H) 11/10/2019   Plan:  - Increase her Metformin dose to twice daily with breakfast and lunch. Will refill today.    - She will continue to focus on protein-rich, low simple carbohydrate foods. We reviewed the importance of hydration, regular exercise for stress reduction, and restorative sleep.    - Continue to decrease simple carbs/ sugars; increase fiber and proteins -> follow her meal plan.    - Explained role of simple carbs and insulin levels on hunger and cravings  - Anticipatory guidance given.    - Briunna will continue to work on weight loss, exercise, via their meal plan we devised to help decrease the risk of progressing to diabetes.  - We will recheck A1c and fasting insulin level in approximately 3 months from last check, or as deemed appropriate.    Depression with anxiety Assessment: Condition is Controlled. Pt continues with Zoloft 50mg  two tablets  daily for the past month started during her last visit with Dr. Marquis Lunch. Pt feels as this dosage last longer and allows her to feel more emotions on a greater spectrum in a good way than it did on the lower dose.   Plan: -Continue Zoloft 100mg  twice daily as directed by Dr. Marquis Lunch and as needed. Will refill today.   - Importance of following up with PCP and others was stressed   - Reminded patient of the importance of following their prudent nutrition plan and how food can affect mood as well to support emotional wellbeing.   - We will continue to monitor closely alongside PCP / other specialists.     TREATMENT PLAN FOR OBESITY: BMI 32.0-32.9,adult,  Obesity, starting BMI 41.16, current BMI- 33.39 Assessment:   Rakeya Racca is here to discuss her progress with her obesity treatment plan along with follow-up of her obesity related diagnoses. See Medical Weight Management Flowsheet for complete bioelectrical impedance results.  Condition is not optimized. Biometric data collected today, was reviewed with patient.   Since last office visit on 09/19/2022 patient's  Muscle mass has increased by 0.4lb. Fat mass has increased by 7.8lb. Total body water has increased by 5.8lb.  Counseling done on how various foods will affect these numbers and how to maximize success  Total lbs lost to date: 59 Total weight loss percentage to date: 18.91%   Plan: -Continue to adhere with keeping a food journal and adhering to recommended goals of 2900 calories and 150+ protein   - Unless pre-existing renal or cardiopulmonary conditions exist which patient was told to limit their fluid intake by another provider, I recommended roughly one half of their weight in pounds, to be the approximate ounces of non-caloric, non-caffeinated beverages they should drink per day; including more if they are engaging in exercise.   Behavioral Intervention Additional resources provided today: patient declined Evidence-based interventions for health behavior change were utilized today including the discussion of self monitoring techniques, problem-solving barriers and SMART goal setting techniques.   Regarding patient's less desirable eating habits and patterns, we employed the technique of small changes.  Pt will specifically work on: increase her water intake for next visit.    Recommended Physical Activity Goals Aundreya has been advised to slowly work up to 150 minutes of moderate intensity aerobic activity a week and strengthening exercises 2-3 times per week for cardiovascular health, weight loss maintenance and preservation of muscle mass.   She has agreed to Continue current level of physical activity   FOLLOW UP: Return in  about 4 weeks (around 11/18/2022). She was informed of the importance of frequent follow up visits to maximize her success with intensive lifestyle modifications for her multiple health conditions.  Subjective:   Chief complaint: Obesity Kang is here to discuss her progress with her obesity treatment plan. She is on the keeping a food journal and adhering to recommended goals of 2900 calories and 150+ protein and states she is following her eating plan approximately 80% of the time. She states she is goes to the gym 60 minutes 5 days per week.  Interval History:  Rasheema Bath is here for a follow up office visit. Since last OV she was seen by Dr. Marquis Lunch a month ago.   - Pt states that she does not journal consistently.   - She tends to hit her calorie goals and occasionally her protein goal.   Pharmacotherapy for weight loss: She is currently taking Metformin for medical weight loss.  Denies side effects.    Review of Systems:  Pertinent positives were addressed with patient today.  Reviewed by clinician on day of visit: allergies, medications, problem list, medical history, surgical history, family history, social history, and previous encounter notes.  Weight Summary and Biometrics   Weight Lost Since Last Visit: 0lb  Weight Gained Since Last Visit: 9lb   Vitals Temp: 99.1 F (37.3 C) BP: 115/78 Pulse Rate: 63 SpO2: 99 %   Anthropometric Measurements Height: 6\' 1"  (1.854 m) Weight: 253 lb (114.8 kg) BMI (Calculated): 33.39 Weight at Last Visit: 244lb Weight Lost Since Last Visit: 0lb Weight Gained Since Last Visit: 9lb Starting Weight: 312lb Total Weight Loss (lbs): 59 lb (26.8 kg)   Body Composition  Body Fat %: 41.5 % Fat Mass (lbs): 105 lbs Muscle Mass (lbs): 140.6 lbs Total Body Water (lbs): 104.4 lbs Visceral Fat Rating : 7   Other Clinical Data Fasting: no Labs: no Today's Visit #: 22 Starting Date: 11/10/19     Objective:   PHYSICAL  EXAM: Blood pressure 115/78, pulse 63, temperature 99.1 F (37.3 C), height 6\' 1"  (1.854 m), weight 253 lb (114.8 kg), last menstrual period 10/07/2022, SpO2 99%. Body mass index is 33.38 kg/m.  General: Well Developed, well nourished, and in no acute distress.  HEENT: Normocephalic, atraumatic Skin: Warm and dry, cap RF less 2 sec, good turgor Chest:  Normal excursion, shape, no gross abn Respiratory: speaking in full sentences, no conversational dyspnea NeuroM-Sk: Ambulates w/o assistance, moves * 4 Psych: A and O *3, insight good, mood-full  DIAGNOSTIC DATA REVIEWED:  BMET    Component Value Date/Time   NA 138 10/15/2022 1727   NA 140 07/30/2022 1533   K 4.2 10/15/2022 1727   CL 106 10/15/2022 1727   CO2 21 (L) 10/15/2022 1727   GLUCOSE 88 10/15/2022 1727   BUN 17 10/15/2022 1727   BUN 10 07/30/2022 1533   CREATININE 0.78 10/15/2022 1727   CALCIUM 8.5 (L) 10/15/2022 1727   GFRNONAA >60 10/15/2022 1727   GFRAA 141 03/06/2020 1210   Lab Results  Component Value Date   HGBA1C 5.4 07/30/2022   HGBA1C 5.6 11/10/2019   Lab Results  Component Value Date   INSULIN 15.8 12/14/2020   INSULIN 29.3 (H) 11/10/2019   Lab Results  Component Value Date   TSH 5.291 (H) 10/15/2022   CBC    Component Value Date/Time   WBC 12.2 (H) 10/15/2022 1727   RBC 4.73 10/15/2022 1727   HGB 14.7 10/15/2022 1727   HGB 15.5 11/10/2019 1426   HCT 43.0 10/15/2022 1727   HCT 47.8 (H) 11/10/2019 1426   PLT 310 10/15/2022 1727   PLT 300 11/10/2019 1426   MCV 90.9 10/15/2022 1727   MCV 90 11/10/2019 1426   MCH 31.1 10/15/2022 1727   MCHC 34.2 10/15/2022 1727   RDW 14.0 10/15/2022 1727   RDW 13.6 11/10/2019 1426   Iron Studies No results found for: "IRON", "TIBC", "FERRITIN", "IRONPCTSAT" Lipid Panel     Component Value Date/Time   CHOL 187 12/14/2020 1351   TRIG 135 12/14/2020 1351   HDL 46 12/14/2020 1351   LDLCALC 117 (H) 12/14/2020 1351   Hepatic Function Panel      Component Value Date/Time   PROT 6.6 07/30/2022 1533   ALBUMIN 4.5 07/30/2022 1533   AST 16 07/30/2022 1533   ALT 16 07/30/2022 1533   ALKPHOS 80 07/30/2022 1533   BILITOT 0.4 07/30/2022 1533  Component Value Date/Time   TSH 5.291 (H) 10/15/2022 1800   TSH 3.870 07/30/2022 1533   Nutritional Lab Results  Component Value Date   VD25OH 36.7 07/30/2022   VD25OH 40.3 12/14/2020   VD25OH 17.5 (L) 03/06/2020    Attestations:   I, Clinical biochemist, acting as a Stage manager for [employer], DO., have compiled all relevant documentation for today's office visit on behalf of Thomasene Lot, DO, while in the presence of [employer], DO.  I have reviewed the above documentation for accuracy and completeness, and I agree with the above. Carlye Grippe, D.O.  The 21st Century Cures Act was signed into law in 2016 which includes the topic of electronic health records.  This provides immediate access to information in MyChart.  This includes consultation notes, operative notes, office notes, lab results and pathology reports.  If you have any questions about what you read please let us know at your next visit so we can discuss your concerns and take corrective action if need be.  We are right here with you.

## 2022-11-05 ENCOUNTER — Telehealth: Payer: 59 | Admitting: Physician Assistant

## 2022-11-05 ENCOUNTER — Ambulatory Visit (HOSPITAL_COMMUNITY): Payer: 59

## 2022-11-05 DIAGNOSIS — J4530 Mild persistent asthma, uncomplicated: Secondary | ICD-10-CM | POA: Diagnosis not present

## 2022-11-05 MED ORDER — BUDESONIDE 90 MCG/ACT IN AEPB
1.0000 | INHALATION_SPRAY | Freq: Two times a day (BID) | RESPIRATORY_TRACT | 0 refills | Status: DC
Start: 2022-11-05 — End: 2023-12-24

## 2022-11-05 NOTE — Progress Notes (Signed)
Virtual Visit Consent   Sherry Schmidt, you are scheduled for a virtual visit with a [facility] provider today. Just as with appointments in the office, your consent must be obtained to participate. Your consent will be active for this visit and any virtual visit you may have with one of our providers in the next 365 days. If you have a MyChart account, a copy of this consent can be sent to you electronically.  As this is a virtual visit, video technology does not allow for your provider to perform a traditional examination. This may limit your provider's ability to fully assess your condition. If your provider identifies any concerns that need to be evaluated in person or the need to arrange testing (such as labs, EKG, etc.), we will make arrangements to do so. Although advances in technology are sophisticated, we cannot ensure that it will always work on either your end or our end. If the connection with a video visit is poor, the visit may have to be switched to a telephone visit. With either a video or telephone visit, we are not always able to ensure that we have a secure connection.  By engaging in this virtual visit, you consent to the provision of healthcare and authorize for your insurance to be billed (if applicable) for the services provided during this visit. Depending on your insurance coverage, you may receive a charge related to this service.  I need to obtain your verbal consent now. Are you willing to proceed with your visit today? Sherry Schmidt has provided verbal consent on 11/05/2022 for a virtual visit (video or telephone). [name], [place]  Date: 11/05/2022 7:27 PM  Virtual Visit via Video Note   I, [name], connected with  Sherry Schmidt  ([id number], 05/17/1995) on 11/05/22 at  7:15 PM EDT by a video-enabled telemedicine application and verified that I am speaking with the correct person using two identifiers.  Location: Patient: Virtual Visit  Location Patient: Home Provider: Virtual Visit Location Provider: Home Office   I discussed the limitations of evaluation and management by telemedicine and the availability of in person appointments. The patient expressed understanding and agreed to proceed.    History of Present Illness: Sherry Schmidt is a 27 y.o. who identifies as a female who was assigned female at birth, and is being seen today for ongoing asthma symptoms. Notes history of mild, intermittent asthma, mainly exercise- induced for which she has an albuterol inhaler. Noting this year with increased use, more so over spring/summer. Notes still getting relief from albuterol but using more frequently. Notes some associated nasal congestion along with her chest tightness and wheezing. Denies fever, chills, chest pain. Feels like anxiety overall has been doing well. Does not feel contributory.  HPI: HPI  Problems:  Patient Active Problem List   Diagnosis Date Noted   BMI 33.0-33.9,adult, Current BMI 33.0 05/23/2022   Obesity (HCC)- Start BMI 41.16 05/23/2022   Pre-diabetes 09/27/2021   Hypothyroidism 05/12/2020   Transaminitis 03/06/2020   Abnormal thyroid blood test 03/06/2020   Mixed hyperlipidemia 03/06/2020   Vitamin D deficiency 02/18/2020   Insulin resistance 02/18/2020   Depression with anxiety 02/18/2020   Class 3 severe obesity with serious comorbidity and body mass index (BMI) of 40.0 to 44.9 in adult [facility]) 02/18/2020   Fatigue 11/06/2016   Postprocedural hypothyroidism 10/16/2016    Allergies: No Known Allergies Medications:  Current Outpatient Medications:    Budesonide 90 MCG/ACT inhaler, Inhale 1 puff into the lungs  2 (two) times daily., Disp: 1 each, Rfl: 0   albuterol (VENTOLIN HFA) 108 (90 Base) MCG/ACT inhaler, Inhale 2 puffs into the lungs every 4 (four) hours as needed for wheezing or shortness of breath., Disp: 1 each, Rfl: 0   hydrOXYzine (ATARAX) 10 MG tablet, Take 1 tablet (10 mg total) by  mouth 2 (two) times daily as needed for anxiety., Disp: 10 tablet, Rfl: 0   levothyroxine (SYNTHROID) 100 MCG tablet, Take 1 tablet (100 mcg total) by mouth daily before breakfast., Disp: 90 tablet, Rfl: 0   metFORMIN (GLUCOPHAGE) 500 MG tablet, 1 with breakfast and 1 with lunch daily, Disp: 180 tablet, Rfl: 0   sertraline (ZOLOFT) 100 MG tablet, Take 1 tablet (100 mg total) by mouth daily., Disp: 90 tablet, Rfl: 0   Vitamin D, Ergocalciferol, (DRISDOL) 1.25 MG (50000 UNIT) CAPS capsule, Take 1 capsule (50,000 Units total) by mouth every 3 (three) days., Disp: 8 capsule, Rfl: 0  Observations/Objective: Patient is well-developed, well-nourished in no acute distress.  Resting comfortably at home.  Head is normocephalic, atraumatic.  No labored breathing. Speech is clear and coherent with logical content.  Patient is alert and oriented at baseline.   Assessment and Plan: 1. Mild persistent asthma, unspecified whether complicated - Budesonide 90 MCG/ACT inhaler; Inhale 1 puff into the lungs 2 (two) times daily.  Dispense: 1 each; Refill: 0  Will add on ICS for now. Per EMR [facility] preferred. Rx sent to pharmacy. Continue albuterol PRN. Schedule in-person follow-up with PCP in next couple of weeks.   Follow Up Instructions: I discussed the assessment and treatment plan with the patient. The patient was provided an opportunity to ask questions and all were answered. The patient agreed with the plan and demonstrated an understanding of the instructions.  A copy of instructions were sent to the patient via MyChart unless otherwise noted below.   The patient was advised to call back or seek an in-person evaluation if the symptoms worsen or if the condition fails to improve as anticipated.  Time:  I spent 10 minutes with the patient via telehealth technology discussing the above problems/concerns.    [name], PA-C

## 2022-11-05 NOTE — Patient Instructions (Signed)
  Madelyn Golliday, thank you for joining [name], PA-C for today's virtual visit.  While this provider is not your primary care provider (PCP), if your PCP is located in our provider database this encounter information will be shared with them immediately following your visit.   A [facility] MyChart account gives you access to today's visit and all your visits, tests, and labs performed at [facility] " click here if you don't have a [facility] MyChart account or go to [email or web address]  Consent: (Patient) Sherry Schmidt provided verbal consent for this virtual visit at the beginning of the encounter.  Current Medications:  Current Outpatient Medications:    albuterol (VENTOLIN HFA) 108 (90 Base) MCG/ACT inhaler, Inhale 2 puffs into the lungs every 4 (four) hours as needed for wheezing or shortness of breath., Disp: 1 each, Rfl: 0   hydrOXYzine (ATARAX) 10 MG tablet, Take 1 tablet (10 mg total) by mouth 2 (two) times daily as needed for anxiety., Disp: 10 tablet, Rfl: 0   levothyroxine (SYNTHROID) 100 MCG tablet, Take 1 tablet (100 mcg total) by mouth daily before breakfast., Disp: 90 tablet, Rfl: 0   metFORMIN (GLUCOPHAGE) 500 MG tablet, 1 with breakfast and 1 with lunch daily, Disp: 180 tablet, Rfl: 0   methocarbamol (ROBAXIN) 500 MG tablet, Take 1 tablet (500 mg total) by mouth 2 (two) times daily as needed for muscle spasms., Disp: 20 tablet, Rfl: 0   ondansetron (ZOFRAN-ODT) 4 MG disintegrating tablet, Take 1 tablet (4 mg total) by mouth every 6 (six) hours as needed for nausea or vomiting., Disp: 20 tablet, Rfl: 0   sertraline (ZOLOFT) 100 MG tablet, Take 1 tablet (100 mg total) by mouth daily., Disp: 90 tablet, Rfl: 0   Vitamin D, Ergocalciferol, (DRISDOL) 1.25 MG (50000 UNIT) CAPS capsule, Take 1 capsule (50,000 Units total) by mouth every 3 (three) days., Disp: 8 capsule, Rfl: 0   Medications ordered in this encounter:  No orders of the defined  types were placed in this encounter.    *If you need refills on other medications prior to your next appointment, please contact your pharmacy*  Follow-Up: Call back or seek an in-person evaluation if the symptoms worsen or if the condition fails to improve as anticipated.  [facility] [phone]  Other Instructions Start the new inhaled medication as directed. Ok to continue albuterol as needed. Continue switching your Claritin to Xyzal once daily.  Consider starting Flonase OTC. Follow-up in person with your PCP in the next 1-3 weeks.    If you have been instructed to have an in-person evaluation today at a local Urgent Care facility, please use the link below. It will take you to a list of all of our available [facility], including address, phone number and hours of operation. Please do not delay care.  [facility]  If you or a family member do not have a primary care provider, use the link below to schedule a visit and establish care. When you choose a [facility] primary care physician or advanced practice provider, you gain a long-term partner in health. Find a Primary Care Provider  Learn more about [facility] in-office and virtual care options: [facility] - Get Care Now

## 2022-11-18 ENCOUNTER — Ambulatory Visit (INDEPENDENT_AMBULATORY_CARE_PROVIDER_SITE_OTHER): Payer: 59 | Admitting: Family Medicine

## 2022-11-18 ENCOUNTER — Encounter (INDEPENDENT_AMBULATORY_CARE_PROVIDER_SITE_OTHER): Payer: Self-pay

## 2022-11-25 ENCOUNTER — Telehealth: Payer: 59 | Admitting: Physician Assistant

## 2022-11-25 DIAGNOSIS — H8113 Benign paroxysmal vertigo, bilateral: Secondary | ICD-10-CM | POA: Diagnosis not present

## 2022-11-25 MED ORDER — IPRATROPIUM BROMIDE 0.03 % NA SOLN: 2.0000 | Freq: Two times a day (BID) | NASAL | 0 refills | Status: DC

## 2022-11-25 MED ORDER — ONDANSETRON 4 MG PO TBDP
4.0000 mg | ORAL_TABLET | Freq: Three times a day (TID) | ORAL | 0 refills | Status: DC | PRN
Start: 2022-11-25 — End: 2022-12-18

## 2022-11-25 NOTE — Patient Instructions (Signed)
Sherry Schmidt, thank you for joining [name], PA-C for today's virtual visit.  While this provider is not your primary care provider (PCP), if your PCP is located in our provider database this encounter information will be shared with them immediately following your visit.   A [facility] MyChart account gives you access to today's visit and all your visits, tests, and labs performed at [facility] " click here if you don't have a [facility] MyChart account or go to [email or web address]  Consent: (Patient) Sherry Schmidt provided verbal consent for this virtual visit at the beginning of the encounter.  Current Medications:  Current Outpatient Medications:    albuterol (VENTOLIN HFA) 108 (90 Base) MCG/ACT inhaler, Inhale 2 puffs into the lungs every 4 (four) hours as needed for wheezing or shortness of breath., Disp: 1 each, Rfl: 0   Budesonide 90 MCG/ACT inhaler, Inhale 1 puff into the lungs 2 (two) times daily., Disp: 1 each, Rfl: 0   hydrOXYzine (ATARAX) 10 MG tablet, Take 1 tablet (10 mg total) by mouth 2 (two) times daily as needed for anxiety., Disp: 10 tablet, Rfl: 0   levothyroxine (SYNTHROID) 100 MCG tablet, Take 1 tablet (100 mcg total) by mouth daily before breakfast., Disp: 90 tablet, Rfl: 0   metFORMIN (GLUCOPHAGE) 500 MG tablet, 1 with breakfast and 1 with lunch daily, Disp: 180 tablet, Rfl: 0   sertraline (ZOLOFT) 100 MG tablet, Take 1 tablet (100 mg total) by mouth daily., Disp: 90 tablet, Rfl: 0   Vitamin D, Ergocalciferol, (DRISDOL) 1.25 MG (50000 UNIT) CAPS capsule, Take 1 capsule (50,000 Units total) by mouth every 3 (three) days., Disp: 8 capsule, Rfl: 0   Medications ordered in this encounter:  No orders of the defined types were placed in this encounter.    *If you need refills on other medications prior to your next appointment, please contact your pharmacy*  Follow-Up: Call back or seek an in-person evaluation if the symptoms  worsen or if the condition fails to improve as anticipated.  [facility] [phone]  Other Instructions Please keep hydrated. Start an OTC antihistamine like Xyzal once daily. Use the Atrovent spray as directed. The zofran is for nausea and should help dizziness. Start exercises below. If not resolving over next 48 hours or any new/worsening symptoms, you need an in-person evaluation ASAP.  How to Perform the Epley Maneuver The Epley maneuver is an exercise that relieves symptoms of vertigo. Vertigo is the feeling that you or your surroundings are moving when they are not. When you feel vertigo, you may feel like the room is spinning and may have trouble walking. The Epley maneuver is used for a type of vertigo caused by a calcium deposit in a part of the inner ear. The maneuver involves changing head positions to help the deposit move out of the area. You can do this maneuver at home whenever you have symptoms of vertigo. You can repeat it in 24 hours if your vertigo has not gone away. Even though the Epley maneuver may relieve your vertigo for a few weeks, it is possible that your symptoms will return. This maneuver relieves vertigo, but it does not relieve dizziness. What are the risks? If it is done correctly, the Epley maneuver is considered safe. Sometimes it can lead to dizziness or nausea that goes away after a short time. If you develop other symptoms--such as changes in vision, weakness, or numbness--stop doing the maneuver and call your health care provider.  Supplies needed: A bed or table. A pillow. How to do the Epley maneuver     Sit on the edge of a bed or table with your back straight and your legs extended or hanging over the edge of the bed or table. Turn your head halfway toward the affected ear or side as told by your health care provider. Lie backward quickly with your head turned until you are lying flat on your back. Your head should dangle  (head-hanging position). You may want to position a pillow under your shoulders. Hold this position for at least 30 seconds. If you feel dizzy or have symptoms of vertigo, continue to hold the position until the symptoms stop. Turn your head to the opposite direction until your unaffected ear is facing down. Your head should continue to dangle. Hold this position for at least 30 seconds. If you feel dizzy or have symptoms of vertigo, continue to hold the position until the symptoms stop. Turn your whole body to the same side as your head so that you are positioned on your side. Your head will now be nearly facedown and no longer needs to dangle. Hold for at least 30 seconds. If you feel dizzy or have symptoms of vertigo, continue to hold the position until the symptoms stop. Sit back up. You can repeat the maneuver in 24 hours if your vertigo does not go away. Follow these instructions at home: For 24 hours after doing the Epley maneuver: Keep your head in an upright position. When lying down to sleep or rest, keep your head raised (elevated) with two or more pillows. Avoid excessive neck movements. Activity Do not drive or use machinery if you feel dizzy. After doing the Epley maneuver, return to your normal activities as told by your health care provider. Ask your health care provider what activities are safe for you. General instructions Drink enough fluid to keep your urine pale yellow. Do not drink alcohol. Take over-the-counter and prescription medicines only as told by your health care provider. Keep all follow-up visits. This is important. Preventing vertigo symptoms Ask your health care provider if there is anything you should do at home to prevent vertigo. He or she may recommend that you: Keep your head elevated with two or more pillows while you sleep. Do not sleep on the side of your affected ear. Get up slowly from bed. Avoid sudden movements during the day. Avoid extreme head  positions or movement, such as looking up or bending over. Contact a health care provider if: Your vertigo gets worse. You have other symptoms, including: Nausea. Vomiting. Headache. Get help right away if you: Have vision changes. Have a headache or neck pain that is severe or getting worse. Cannot stop vomiting. Have new numbness or weakness in any part of your body. These symptoms may represent a serious problem that is an emergency. Do not wait to see if the symptoms will go away. Get medical help right away. Call your local emergency services (911 in the [place] Do not drive yourself to the hospital. Summary Vertigo is the feeling that you or your surroundings are moving when they are not. The Epley maneuver is an exercise that relieves symptoms of vertigo. If the Epley maneuver is done correctly, it is considered safe. This information is not intended to replace advice given to you by your health care provider. Make sure you discuss any questions you have with your health care provider. Document Revised: 02/16/2020 Document Reviewed: 02/16/2020 Elsevier Patient  Education  2024 [employer].    If you have been instructed to have an in-person evaluation today at a local Urgent Care facility, please use the link below. It will take you to a list of all of our available [facility], including address, phone number and hours of operation. Please do not delay care.  [facility]  If you or a family member do not have a primary care provider, use the link below to schedule a visit and establish care. When you choose a [facility] primary care physician or advanced practice provider, you gain a long-term partner in health. Find a Primary Care Provider  Learn more about [facility] in-office and virtual care options: [facility] - Get Care Now

## 2022-11-25 NOTE — Progress Notes (Signed)
Virtual Visit Consent   Sherry Schmidt, you are scheduled for a virtual visit with a [facility] provider today. Just as with appointments in the office, your consent must be obtained to participate. Your consent will be active for this visit and any virtual visit you may have with one of our providers in the next 365 days. If you have a MyChart account, a copy of this consent can be sent to you electronically.  As this is a virtual visit, video technology does not allow for your provider to perform a traditional examination. This may limit your provider's ability to fully assess your condition. If your provider identifies any concerns that need to be evaluated in person or the need to arrange testing (such as labs, EKG, etc.), we will make arrangements to do so. Although advances in technology are sophisticated, we cannot ensure that it will always work on either your end or our end. If the connection with a video visit is poor, the visit may have to be switched to a telephone visit. With either a video or telephone visit, we are not always able to ensure that we have a secure connection.  By engaging in this virtual visit, you consent to the provision of healthcare and authorize for your insurance to be billed (if applicable) for the services provided during this visit. Depending on your insurance coverage, you may receive a charge related to this service.  I need to obtain your verbal consent now. Are you willing to proceed with your visit today? Sherry Schmidt has provided verbal consent on 11/25/2022 for a virtual visit (video or telephone). Sherry Schmidt, [place]  Date: 11/25/2022 1:59 PM  Virtual Visit via Video Note   I, Sherry Schmidt, connected with  Sherry Schmidt  ([id number], 11/02/1995) on 11/25/22 at  2:00 PM EDT by a video-enabled telemedicine application and verified that I am speaking with the correct person using two identifiers.  Location: Patient: Virtual Visit  Location Patient: Home Provider: Virtual Visit Location Provider: Home Office   I discussed the limitations of evaluation and management by telemedicine and the availability of in person appointments. The patient expressed understanding and agreed to proceed.    History of Present Illness: Sherry Schmidt is a 27 y.o. who identifies as a female who was assigned female at birth, and is being seen today for some dizziness starting this morning, first noting on waking and sitting up quickly. Notes if she moves in a certain position, the dizziness happens. Notes nausea without vomiting. Some anorexia. Denies sinus pressure, sinus pain. Denies recent travel.Denies change to diet. Some left ear pressure and popping. No ear pain.  Denies chest pain or SOB. Denies racing heart or change to medications.   HPI: HPI  Problems:  Patient Active Problem List   Diagnosis Date Noted   BMI 33.0-33.9,adult, Current BMI 33.0 05/23/2022   Obesity (HCC)- Start BMI 41.16 05/23/2022   Pre-diabetes 09/27/2021   Hypothyroidism 05/12/2020   Transaminitis 03/06/2020   Abnormal thyroid blood test 03/06/2020   Mixed hyperlipidemia 03/06/2020   Vitamin D deficiency 02/18/2020   Insulin resistance 02/18/2020   Depression with anxiety 02/18/2020   Class 3 severe obesity with serious comorbidity and body mass index (BMI) of 40.0 to 44.9 in adult [facility]) 02/18/2020   Fatigue 11/06/2016   Postprocedural hypothyroidism 10/16/2016    Allergies: No Known Allergies Medications:  Current Outpatient Medications:    ipratropium (ATROVENT) 0.03 % nasal spray, Place 2 sprays into both nostrils every 12 (  twelve) hours., Disp: 30 mL, Rfl: 0   ondansetron (ZOFRAN-ODT) 4 MG disintegrating tablet, Take 1 tablet (4 mg total) by mouth every 8 (eight) hours as needed for nausea or vomiting., Disp: 20 tablet, Rfl: 0   albuterol (VENTOLIN HFA) 108 (90 Base) MCG/ACT inhaler, Inhale 2 puffs into the lungs every 4 (four) hours as needed  for wheezing or shortness of breath., Disp: 1 each, Rfl: 0   Budesonide 90 MCG/ACT inhaler, Inhale 1 puff into the lungs 2 (two) times daily., Disp: 1 each, Rfl: 0   hydrOXYzine (ATARAX) 10 MG tablet, Take 1 tablet (10 mg total) by mouth 2 (two) times daily as needed for anxiety., Disp: 10 tablet, Rfl: 0   levothyroxine (SYNTHROID) 100 MCG tablet, Take 1 tablet (100 mcg total) by mouth daily before breakfast., Disp: 90 tablet, Rfl: 0   metFORMIN (GLUCOPHAGE) 500 MG tablet, 1 with breakfast and 1 with lunch daily, Disp: 180 tablet, Rfl: 0   sertraline (ZOLOFT) 100 MG tablet, Take 1 tablet (100 mg total) by mouth daily., Disp: 90 tablet, Rfl: 0   Vitamin D, Ergocalciferol, (DRISDOL) 1.25 MG (50000 UNIT) CAPS capsule, Take 1 capsule (50,000 Units total) by mouth every 3 (three) days., Disp: 8 capsule, Rfl: 0  Observations/Objective: Patient is well-developed, well-nourished in no acute distress.  Resting comfortably at home.  Head is normocephalic, atraumatic.  No labored breathing. Speech is clear and coherent with logical content.  Patient is alert and oriented at baseline.   Assessment and Plan: 1. Benign paroxysmal positional vertigo due to bilateral vestibular disorder - ondansetron (ZOFRAN-ODT) 4 MG disintegrating tablet; Take 1 tablet (4 mg total) by mouth every 8 (eight) hours as needed for nausea or vomiting.  Dispense: 20 tablet; Refill: 0 - ipratropium (ATROVENT) 0.03 % nasal spray; Place 2 sprays into both nostrils every 12 (twelve) hours.  Dispense: 30 mL; Refill: 0  With eustachian tube dysfunction. No alarm signs or symptoms present. Start Epley maneuvers. Zofran OFT and nasal steroid spray per orders. Strict in-person evaluation precautions reviewed.   Follow Up Instructions: I discussed the assessment and treatment plan with the patient. The patient was provided an opportunity to ask questions and all were answered. The patient agreed with the plan and demonstrated an  understanding of the instructions.  A copy of instructions were sent to the patient via MyChart unless otherwise noted below.   The patient was advised to call back or seek an in-person evaluation if the symptoms worsen or if the condition fails to improve as anticipated.  Time:  I spent 10 minutes with the patient via telehealth technology discussing the above problems/concerns.    Sherry Climes, PA-C

## 2022-12-10 ENCOUNTER — Ambulatory Visit (HOSPITAL_COMMUNITY)
Admission: EM | Admit: 2022-12-10 | Discharge: 2022-12-10 | Disposition: A | Discharge status: Home / Self Care | Payer: Medicaid Other | Attending: Internal Medicine | Admitting: Internal Medicine

## 2022-12-10 ENCOUNTER — Encounter (HOSPITAL_COMMUNITY): Payer: Self-pay

## 2022-12-10 DIAGNOSIS — M545 Low back pain, unspecified: Secondary | ICD-10-CM

## 2022-12-10 MED ORDER — KETOROLAC TROMETHAMINE 30 MG/ML IJ SOLN
INTRAMUSCULAR | Status: AC
  Filled 2022-12-10: qty 1

## 2022-12-10 MED ORDER — METHOCARBAMOL 500 MG PO TABS: 500.0000 mg | ORAL_TABLET | Freq: Three times a day (TID) | ORAL | 0 refills | Status: DC

## 2022-12-10 MED ORDER — KETOROLAC TROMETHAMINE 30 MG/ML IJ SOLN
30.0000 mg | Freq: Once | INTRAMUSCULAR | Status: AC
  Administered 2022-12-10: 30 mg via INTRAMUSCULAR

## 2022-12-10 NOTE — Discharge Instructions (Addendum)
Follow up with [facility] to have further work up since you have had this type back pain for so many years

## 2022-12-10 NOTE — ED Triage Notes (Signed)
Patient here today with c/o LB pain X 2 days. No known injury. She has been taking IBU and lidocaine patches with no relief.

## 2022-12-10 NOTE — ED Provider Notes (Signed)
[facility]    CSN: [id number] Arrival date & time: 12/10/22  0801      History   Chief Complaint Chief Complaint  Patient presents with   Back Pain    HPI Sherry Schmidt is a 27 y.o. female presents with recurrent low back pain s 2 days. She denies an injury. Has been taking Ibuprofen and heat without relief Her pain is described as constant dull pain. Mild alleviation with laying on her side but nothing else. The pain is located on entire lumbar region bilaterally.  Worse when laying down, or any type movement.  In the past she responded to heat and cold and muscle relaxer's.  On occasion pain radiates to the posterior thigh to mid region.  Denies any kinds of paresthesia.  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  She has never see ortho, PT or chiropractor for her chronic back pain which she has had since childhood.     Past Medical History:  Diagnosis Date   Asthma    Back pain    Back pain    Depression    Panic attacks    Thyroid disease     Patient Active Problem List   Diagnosis Date Noted   BMI 33.0-33.9,adult, Current BMI 33.0 05/23/2022   Obesity (HCC)- Start BMI 41.16 05/23/2022   Pre-diabetes 09/27/2021   Hypothyroidism 05/12/2020   Transaminitis 03/06/2020   Abnormal thyroid blood test 03/06/2020   Mixed hyperlipidemia 03/06/2020   Vitamin D deficiency 02/18/2020   Insulin resistance 02/18/2020   Depression with anxiety 02/18/2020   Class 3 severe obesity with serious comorbidity and body mass index (BMI) of 40.0 to 44.9 in adult [facility]) 02/18/2020   Fatigue 11/06/2016   Postprocedural hypothyroidism 10/16/2016    Past Surgical History:  Procedure Laterality Date   THYROIDECTOMY      OB History     Gravida  0   Para  0   Term  0   Preterm  0   AB  0   Living  0      SAB  0   IAB  0   Ectopic  0   Multiple  0   Live Births  0            Home Medications    Prior to Admission  medications   Medication Sig Start Date End Date Taking? Authorizing Provider  methocarbamol (ROBAXIN) 500 MG tablet Take 1 tablet (500 mg total) by mouth 3 (three) times daily. 12/10/22  Yes Rodriguez-Southworth, Nettie Elm, PA-C  albuterol (VENTOLIN HFA) 108 (90 Base) MCG/ACT inhaler Inhale 2 puffs into the lungs every 4 (four) hours as needed for wheezing or shortness of breath. 07/04/22   Zenia Resides, MD  Budesonide 90 MCG/ACT inhaler Inhale 1 puff into the lungs 2 (two) times daily. 11/05/22   Waldon Merl, PA-C  hydrOXYzine (ATARAX) 10 MG tablet Take 1 tablet (10 mg total) by mouth 2 (two) times daily as needed for anxiety. 10/15/22   Zenia Resides, MD  ipratropium (ATROVENT) 0.03 % nasal spray Place 2 sprays into both nostrils every 12 (twelve) hours. 11/25/22   Waldon Merl, PA-C  levothyroxine (SYNTHROID) 100 MCG tablet Take 1 tablet (100 mcg total) by mouth daily before breakfast. 10/21/22   Opalski, Gavin Pound, DO  metFORMIN (GLUCOPHAGE) 500 MG tablet 1 with breakfast and 1 with lunch daily 10/21/22   Thomasene Lot, DO  ondansetron (ZOFRAN-ODT)  4 MG disintegrating tablet Take 1 tablet (4 mg total) by mouth every 8 (eight) hours as needed for nausea or vomiting. 11/25/22   Waldon Merl, PA-C  sertraline (ZOLOFT) 100 MG tablet Take 1 tablet (100 mg total) by mouth daily. 10/21/22   Opalski, Gavin Pound, DO  Vitamin D, Ergocalciferol, (DRISDOL) 1.25 MG (50000 UNIT) CAPS capsule Take 1 capsule (50,000 Units total) by mouth every 3 (three) days. 08/14/20   Alois Cliche, PA-C    Family History Family History  Problem Relation Age of Onset   Hypertension Mother    Depression Mother    Bipolar disorder Mother    Drug abuse Mother    Obesity Mother    Diabetes Father    Thyroid disease Father    Cancer Father    Depression Father    Anxiety disorder Father    Bipolar disorder Father    Schizophrenia Father    Liver disease Father    Sleep apnea Father    Alcohol abuse  Father    Drug abuse Father    Obesity Father     Social History Social History   Tobacco Use   Smoking status: Never   Smokeless tobacco: Never  Vaping Use   Vaping status: Never Used  Substance Use Topics   Alcohol use: Yes    Comment: occasional   Drug use: No     Allergies   Patient has no known allergies.   Review of Systems Review of Systems  As noted in HPI  Physical Exam Triage Vital Signs ED Triage Vitals  Encounter Vitals Group     BP 12/10/22 0821 (!) 105/58     Systolic BP Percentile --      Diastolic BP Percentile --      Pulse Rate 12/10/22 0821 62     Resp 12/10/22 0821 16     Temp 12/10/22 0821 98.3 F (36.8 C)     Temp Source 12/10/22 0821 Oral     SpO2 12/10/22 0821 98 %     Weight 12/10/22 0821 260 lb (117.9 kg)     Height 12/10/22 0821 6\' 1"  (1.854 m)     Head Circumference --      Peak Flow --      Pain Score 12/10/22 0819 3     Pain Loc --      Pain Education --      Exclude from Growth Chart --    No data found.  Updated Vital Signs BP (!) 105/58 (BP Location: Right Arm)   Pulse 62   Temp 98.3 F (36.8 C) (Oral)   Resp 16   Ht 6\' 1"  (1.854 m)   Wt 260 lb (117.9 kg)   LMP 11/08/2022 (Exact Date)   SpO2 98%   BMI 34.30 kg/m   Visual Acuity Right Eye Distance:   Left Eye Distance:   Bilateral Distance:    Right Eye Near:   Left Eye Near:    Bilateral Near:     Physical Exam Vitals and nursing note reviewed.  Constitutional:      Appearance: She is normal weight.     Comments: Seems in moderate pain and moves very slow due to pain  HENT:     Right Ear: External ear normal.     Left Ear: External ear normal.  Eyes:     General: No scleral icterus.    Conjunctiva/sclera: Conjunctivae normal.  Pulmonary:     Effort: Pulmonary effort is normal.  Musculoskeletal:        General: Normal range of motion.     Cervical back: Neck supple.     Comments: BACK- has local tenderness on central and R & L mid to lower lumbar  region. ROM is limited due to pain. Anterior flexion only to 70 degrees due to pain. Negative SLR  Skin:    General: Skin is warm and dry.     Findings: No rash.  Neurological:     Mental Status: She is alert and oriented to person, place, and time.     Gait: Gait normal.  Psychiatric:        Mood and Affect: Mood normal.        Behavior: Behavior normal.        Thought Content: Thought content normal.        Judgment: Judgment normal.      UC Treatments / Results  Labs (all labs ordered are listed, but only abnormal results are displayed) Labs Reviewed - No data to display  EKG   Radiology No results found.  Procedures Procedures (including critical care time)  Medications Ordered in UC Medications  ketorolac (TORADOL) 30 MG/ML injection 30 mg (30 mg Intramuscular Given 12/10/22 0843)    Initial Impression / Assessment and Plan / UC Course  I have reviewed the triage vital signs and the nursing notes.  Pertinent  imaging results  for 2019 that were available during my care of the patient were reviewed by me and considered in my medical decision making (see chart for details).  She was given Toradol 30 mg IM which helped her move better so I could finish my exam. I prescribed her Robaxen as noted since she has taken this before.  She is to FU with ortho for further work up.   CLINICAL DATA:  Back pain after attempting to move large package ester day.   EXAM: LUMBAR SPINE - COMPLETE 4+ VIEW   COMPARISON:  None.   FINDINGS: There is no evidence of lumbar spine fracture. Alignment is normal. There are 5 non ribbed lumbar vertebrae in normal lordosis. Slight disc space narrowing noted at L1-2, L4-5 and L5-S1. No pars defects or listhesis.   IMPRESSION: Relative mild disc space narrowing L1-2, L4-5 and L5-S1. No acute osseous abnormality.     Electronically Signed   By: Tollie Eth M.D.   On: 10/13/2017 01:46   Final Clinical Impressions(s) / UC Diagnoses    Final diagnoses:  Acute midline low back pain without sciatica     Discharge Instructions      Follow up with [facility] to have further work up since you have had this type back pain for so many years      ED Prescriptions     Medication Sig Dispense Auth. Provider   methocarbamol (ROBAXIN) 500 MG tablet Take 1 tablet (500 mg total) by mouth 3 (three) times daily. 21 tablet Rodriguez-Southworth, Nettie Elm, PA-C      PDMP not reviewed this encounter.   Garey Ham, [place] 12/10/22 [phone]

## 2022-12-13 ENCOUNTER — Telehealth (HOSPITAL_COMMUNITY): Payer: Self-pay | Admitting: Emergency Medicine

## 2022-12-13 NOTE — Telephone Encounter (Signed)
LMTRC.  Advised patient that her new work note was sent to her mychart per her request.  If she had any questions to contact the office.

## 2022-12-18 ENCOUNTER — Telehealth: Payer: 59 | Admitting: Physician Assistant

## 2022-12-18 ENCOUNTER — Telehealth: Payer: 59 | Admitting: Family Medicine

## 2022-12-18 ENCOUNTER — Encounter: Payer: Self-pay | Admitting: Physician Assistant

## 2022-12-18 DIAGNOSIS — J069 Acute upper respiratory infection, unspecified: Secondary | ICD-10-CM | POA: Diagnosis not present

## 2022-12-18 MED ORDER — FLUTICASONE PROPIONATE 50 MCG/ACT NA SUSP: 2.0000 | Freq: Every day | NASAL | 0 refills | Status: DC

## 2022-12-18 NOTE — Progress Notes (Signed)
The patient no-showed for appointment despite this provider sending direct link, reaching out via phone with no response and waiting for at least 10 minutes from appointment time for patient to join. They will be marked as a NS for this appointment/time.   Freddy Finner, NP

## 2022-12-18 NOTE — Patient Instructions (Signed)
Kent Asebedo, thank you for joining [name], PA-C for today's virtual visit.  While this provider is not your primary care provider (PCP), if your PCP is located in our provider database this encounter information will be shared with them immediately following your visit.   A [facility] MyChart account gives you access to today's visit and all your visits, tests, and labs performed at [facility] " click here if you don't have a [facility] MyChart account or go to [email or web address]  Consent: (Patient) Myria Steele provided verbal consent for this virtual visit at the beginning of the encounter.  Current Medications:  Current Outpatient Medications:    albuterol (VENTOLIN HFA) 108 (90 Base) MCG/ACT inhaler, Inhale 2 puffs into the lungs every 4 (four) hours as needed for wheezing or shortness of breath., Disp: 1 each, Rfl: 0   Budesonide 90 MCG/ACT inhaler, Inhale 1 puff into the lungs 2 (two) times daily., Disp: 1 each, Rfl: 0   hydrOXYzine (ATARAX) 10 MG tablet, Take 1 tablet (10 mg total) by mouth 2 (two) times daily as needed for anxiety., Disp: 10 tablet, Rfl: 0   ipratropium (ATROVENT) 0.03 % nasal spray, Place 2 sprays into both nostrils every 12 (twelve) hours., Disp: 30 mL, Rfl: 0   levothyroxine (SYNTHROID) 100 MCG tablet, Take 1 tablet (100 mcg total) by mouth daily before breakfast., Disp: 90 tablet, Rfl: 0   metFORMIN (GLUCOPHAGE) 500 MG tablet, 1 with breakfast and 1 with lunch daily, Disp: 180 tablet, Rfl: 0   methocarbamol (ROBAXIN) 500 MG tablet, Take 1 tablet (500 mg total) by mouth 3 (three) times daily., Disp: 21 tablet, Rfl: 0   ondansetron (ZOFRAN-ODT) 4 MG disintegrating tablet, Take 1 tablet (4 mg total) by mouth every 8 (eight) hours as needed for nausea or vomiting., Disp: 20 tablet, Rfl: 0   sertraline (ZOLOFT) 100 MG tablet, Take 1 tablet (100 mg total) by mouth daily., Disp: 90 tablet, Rfl: 0   Vitamin D, Ergocalciferol,  (DRISDOL) 1.25 MG (50000 UNIT) CAPS capsule, Take 1 capsule (50,000 Units total) by mouth every 3 (three) days., Disp: 8 capsule, Rfl: 0   Medications ordered in this encounter:  No orders of the defined types were placed in this encounter.    *If you need refills on other medications prior to your next appointment, please contact your pharmacy*  Follow-Up: Call back or seek an in-person evaluation if the symptoms worsen or if the condition fails to improve as anticipated.  [facility] [phone]  Other Instructions   We are sorry that you are not feeling well.  Here is how we plan to help!  Based on what you have shared with me it looks like you have sinusitis.    As discussed, please take a home COVID test as a precaution and message [place] with the results.   Sinusitis is inflammation and infection in the sinus cavities of the head.  Based on your presentation I believe you most likely have Acute Viral Sinusitis.This is an infection most likely caused by a virus. There is not specific treatment for viral sinusitis other than to help you with the symptoms until the infection runs its course.  You may use an oral decongestant such as Mucinex D or if you have glaucoma or high blood pressure use plain Mucinex. Saline nasal spray help and can safely be used as often as needed for congestion, I have prescribed: Fluticasone nasal spray two sprays in each nostril once a  day  Some authorities believe that zinc sprays or the use of Echinacea may shorten the course of your symptoms.  Sinus infections are not as easily transmitted as other respiratory infection, however we still recommend that you avoid close contact with loved ones, especially the very young and elderly.  Remember to wash your hands thoroughly throughout the day as this is the number one way to prevent the spread of infection!  Home Care: Only take medications as instructed by your medical team. Do not take these  medications with alcohol. A steam or ultrasonic humidifier can help congestion.  You can place a towel over your head and breathe in the steam from hot water coming from a faucet. Avoid close contacts especially the very young and the elderly. Cover your mouth when you cough or sneeze. Always remember to wash your hands.  Get Help Right Away If: You develop worsening fever or sinus pain. You develop a severe head ache or visual changes. Your symptoms persist after you have completed your treatment plan.  Make sure you Understand these instructions. Will watch your condition. Will get help right away if you are not doing well or get worse.     If you have been instructed to have an in-person evaluation today at a local Urgent Care facility, please use the link below. It will take you to a list of all of our available [facility], including address, phone number and hours of operation. Please do not delay care.  [facility]  If you or a family member do not have a primary care provider, use the link below to schedule a visit and establish care. When you choose a [facility] primary care physician or advanced practice provider, you gain a long-term partner in health. Find a Primary Care Provider  Learn more about [facility] in-office and virtual care options: [facility] - Get Care Now

## 2022-12-18 NOTE — Progress Notes (Signed)
Virtual Visit Consent   Sherry Schmidt, you are scheduled for a virtual visit with a [facility] provider today. Just as with appointments in the office, your consent must be obtained to participate. Your consent will be active for this visit and any virtual visit you may have with one of our providers in the next 365 days. If you have a MyChart account, a copy of this consent can be sent to you electronically.  As this is a virtual visit, video technology does not allow for your provider to perform a traditional examination. This may limit your provider's ability to fully assess your condition. If your provider identifies any concerns that need to be evaluated in person or the need to arrange testing (such as labs, EKG, etc.), we will make arrangements to do so. Although advances in technology are sophisticated, we cannot ensure that it will always work on either your end or our end. If the connection with a video visit is poor, the visit may have to be switched to a telephone visit. With either a video or telephone visit, we are not always able to ensure that we have a secure connection.  By engaging in this virtual visit, you consent to the provision of healthcare and authorize for your insurance to be billed (if applicable) for the services provided during this visit. Depending on your insurance coverage, you may receive a charge related to this service.  I need to obtain your verbal consent now. Are you willing to proceed with your visit today? Sherry Schmidt has provided verbal consent on 12/18/2022 for a virtual visit (video or telephone). [name], [place]  Date: 12/18/2022 5:02 PM  Virtual Visit via Video Note   I, [name], connected with  Sherry Schmidt  ([id number], 08/04/1995) on 12/18/22 at  5:00 PM EDT by a video-enabled telemedicine application and verified that I am speaking with the correct person using two identifiers.  Location: Patient: Virtual Visit  Location Patient: Home Provider: Virtual Visit Location Provider: Home Office   I discussed the limitations of evaluation and management by telemedicine and the availability of in person appointments. The patient expressed understanding and agreed to proceed.    History of Present Illness: Sherry Schmidt is a 27 y.o. who identifies as a female who was assigned female at birth, and is being seen today for URI symptoms starting this morning. Notes waking up this morning with nasal congestion, sore throat, decreased smell and taste. Denies fever, chills, aches.  Denies recent travel or known sick contact.   OTC -- Tylenol Cold and Flu  HPI: HPI  Problems:  Patient Active Problem List   Diagnosis Date Noted   BMI 33.0-33.9,adult, Current BMI 33.0 05/23/2022   Obesity (HCC)- Start BMI 41.16 05/23/2022   Pre-diabetes 09/27/2021   Hypothyroidism 05/12/2020   Transaminitis 03/06/2020   Abnormal thyroid blood test 03/06/2020   Mixed hyperlipidemia 03/06/2020   Vitamin D deficiency 02/18/2020   Insulin resistance 02/18/2020   Depression with anxiety 02/18/2020   Class 3 severe obesity with serious comorbidity and body mass index (BMI) of 40.0 to 44.9 in adult [facility]) 02/18/2020   Fatigue 11/06/2016   Postprocedural hypothyroidism 10/16/2016    Allergies: No Known Allergies Medications:  Current Outpatient Medications:    fluticasone (FLONASE) 50 MCG/ACT nasal spray, Place 2 sprays into both nostrils daily., Disp: 16 g, Rfl: 0   albuterol (VENTOLIN HFA) 108 (90 Base) MCG/ACT inhaler, Inhale 2 puffs into the lungs every 4 (four) hours as  needed for wheezing or shortness of breath., Disp: 1 each, Rfl: 0   Budesonide 90 MCG/ACT inhaler, Inhale 1 puff into the lungs 2 (two) times daily., Disp: 1 each, Rfl: 0   hydrOXYzine (ATARAX) 10 MG tablet, Take 1 tablet (10 mg total) by mouth 2 (two) times daily as needed for anxiety., Disp: 10 tablet, Rfl: 0   levothyroxine (SYNTHROID) 100 MCG tablet,  Take 1 tablet (100 mcg total) by mouth daily before breakfast., Disp: 90 tablet, Rfl: 0   metFORMIN (GLUCOPHAGE) 500 MG tablet, 1 with breakfast and 1 with lunch daily, Disp: 180 tablet, Rfl: 0   methocarbamol (ROBAXIN) 500 MG tablet, Take 1 tablet (500 mg total) by mouth 3 (three) times daily., Disp: 21 tablet, Rfl: 0   sertraline (ZOLOFT) 100 MG tablet, Take 1 tablet (100 mg total) by mouth daily., Disp: 90 tablet, Rfl: 0   Vitamin D, Ergocalciferol, (DRISDOL) 1.25 MG (50000 UNIT) CAPS capsule, Take 1 capsule (50,000 Units total) by mouth every 3 (three) days., Disp: 8 capsule, Rfl: 0  Observations/Objective: Patient is well-developed, well-nourished in no acute distress.  Resting comfortably at home.  Head is normocephalic, atraumatic.  No labored breathing. Speech is clear and coherent with logical content.  Patient is alert and oriented at baseline.   Assessment and Plan: 1. Viral URI - fluticasone (FLONASE) 50 MCG/ACT nasal spray; Place 2 sprays into both nostrils daily.  Dispense: 16 g; Refill: 0 Will have her test for COVID as a precautions giving change in taste and smell. She is to message back with results. If positive will discuss quarantine, antivirals. Regardless will have her increase fluids. Rest. Can continue her OTC medications. Will add on Flonase as directed. Vitamin recommendations reviewed. Work note provided.   Follow Up Instructions: I discussed the assessment and treatment plan with the patient. The patient was provided an opportunity to ask questions and all were answered. The patient agreed with the plan and demonstrated an understanding of the instructions.  A copy of instructions were sent to the patient via MyChart unless otherwise noted below.    The patient was advised to call back or seek an in-person evaluation if the symptoms worsen or if the condition fails to improve as anticipated.  Time:  I spent 10 minutes with the patient via telehealth technology  discussing the above problems/concerns.    [name], PA-C

## 2022-12-24 ENCOUNTER — Telehealth: Payer: 59 | Admitting: Physician Assistant

## 2022-12-24 ENCOUNTER — Ambulatory Visit (HOSPITAL_COMMUNITY): Payer: 59

## 2022-12-24 DIAGNOSIS — R071 Chest pain on breathing: Secondary | ICD-10-CM

## 2022-12-24 DIAGNOSIS — B9689 Other specified bacterial agents as the cause of diseases classified elsewhere: Secondary | ICD-10-CM | POA: Diagnosis not present

## 2022-12-24 DIAGNOSIS — J069 Acute upper respiratory infection, unspecified: Secondary | ICD-10-CM

## 2022-12-24 DIAGNOSIS — J208 Acute bronchitis due to other specified organisms: Secondary | ICD-10-CM

## 2022-12-24 DIAGNOSIS — R0602 Shortness of breath: Secondary | ICD-10-CM

## 2022-12-24 MED ORDER — BENZONATATE 100 MG PO CAPS
100.0000 mg | ORAL_CAPSULE | Freq: Three times a day (TID) | ORAL | 0 refills | Status: DC | PRN
Start: 2022-12-24 — End: 2023-03-03

## 2022-12-24 MED ORDER — AZITHROMYCIN 250 MG PO TABS: ORAL_TABLET | ORAL | 0 refills | Status: AC

## 2022-12-24 MED ORDER — PREDNISONE 20 MG PO TABS: 40.0000 mg | ORAL_TABLET | Freq: Every day | ORAL | 0 refills | Status: DC

## 2022-12-24 NOTE — Progress Notes (Signed)
Because of having shortness of breath with a constant, dull chest pain, I feel your condition warrants further evaluation and I recommend that you be seen in a face to face visit. Your symptoms could be consistent with pneumonia and this requires having someone listen to your lungs and may include a chest xray to confirm.    NOTE: There will be NO CHARGE for this eVisit   If you are having a true medical emergency please call 911.      For an urgent face to face visit, [facility] has eight urgent care centers for your convenience:   NEW!! [facility] Center at [facility] Get Driving Directions [phone] [address], [address] [place], [address]    [facility] Health Urgent Care Center at [facility] Get Driving Directions [phone] [address] [place], [place] [address]   [facility] Health Urgent Care Center [facility]) Get Driving Directions [phone] [address] [place], [place] [address]  [facility] Center [facility]) Get Driving Directions [phone] [address] [place],  [place]  [address]  [facility] Health Urgent Care Center [facility] - at [employer]  [phone] [id number] [place],  [place] [address]   [facility] Health Urgent Care at [facility] Get Driving Directions [phone] [address], [address] [place], [place] [address]   [facility] Health Urgent Care at [facility] Get Driving Directions  [phone] [address] [place], [place] [address]   [facility] Health Urgent Care at [facility] Directions [phone] [address], [address] [place], [place] [address]  Your MyChart E-visit questionnaire answers were reviewed by a board certified advanced clinical practitioner to complete your personal care plan based on your specific symptoms.  Thank you for using e-Visits.    I have spent 5 minutes  in review of e-visit questionnaire, review and updating patient chart, medical decision making and response to patient.   Margaretann Loveless, PA-C

## 2022-12-24 NOTE — Progress Notes (Signed)
Virtual Visit Consent   Sherry Schmidt, you are scheduled for a virtual visit with a [facility] provider today. Just as with appointments in the office, your consent must be obtained to participate. Your consent will be active for this visit and any virtual visit you may have with one of our providers in the next 365 days. If you have a MyChart account, a copy of this consent can be sent to you electronically.  As this is a virtual visit, video technology does not allow for your provider to perform a traditional examination. This may limit your provider's ability to fully assess your condition. If your provider identifies any concerns that need to be evaluated in person or the need to arrange testing (such as labs, EKG, etc.), we will make arrangements to do so. Although advances in technology are sophisticated, we cannot ensure that it will always work on either your end or our end. If the connection with a video visit is poor, the visit may have to be switched to a telephone visit. With either a video or telephone visit, we are not always able to ensure that we have a secure connection.  By engaging in this virtual visit, you consent to the provision of healthcare and authorize for your insurance to be billed (if applicable) for the services provided during this visit. Depending on your insurance coverage, you may receive a charge related to this service.  I need to obtain your verbal consent now. Are you willing to proceed with your visit today? Sherry Schmidt has provided verbal consent on 12/24/2022 for a virtual visit (video or telephone). [name], [place]  Date: 12/24/2022 5:42 PM  Virtual Visit via Video Note   I, [name], connected with  Sherry Schmidt  ([id number], 06-27-1995) on 12/24/22 at  5:15 PM EDT by a video-enabled telemedicine application and verified that I am speaking with the correct person using two identifiers.  Location: Patient: Virtual Visit  Location Patient: Home Provider: Virtual Visit Location Provider: Home Office   I discussed the limitations of evaluation and management by telemedicine and the availability of in person appointments. The patient expressed understanding and agreed to proceed.    History of Present Illness: Sherry Schmidt is a 27 y.o. who identifies as a female who was assigned female at birth, and is being seen today for continued chest congestion and cough that is now productive of thick yellow phlegm. Was initially seen on 9/18 and tested negative for COVID. Has been following supportive care instructions but continued symptoms. Now with chest wall tenderness from coughing. Denies SOB or pleuritic chest pain.  HPI: HPI  Problems:  Patient Active Problem List   Diagnosis Date Noted   BMI 33.0-33.9,adult, Current BMI 33.0 05/23/2022   Obesity (HCC)- Start BMI 41.16 05/23/2022   Pre-diabetes 09/27/2021   Hypothyroidism 05/12/2020   Transaminitis 03/06/2020   Abnormal thyroid blood test 03/06/2020   Mixed hyperlipidemia 03/06/2020   Vitamin D deficiency 02/18/2020   Insulin resistance 02/18/2020   Depression with anxiety 02/18/2020   Class 3 severe obesity with serious comorbidity and body mass index (BMI) of 40.0 to 44.9 in adult [facility]) 02/18/2020   Fatigue 11/06/2016   Postprocedural hypothyroidism 10/16/2016    Allergies: No Known Allergies Medications:  Current Outpatient Medications:    azithromycin (ZITHROMAX) 250 MG tablet, Take 2 tablets on day 1, then 1 tablet daily on days 2 through 5, Disp: 6 tablet, Rfl: 0   benzonatate (TESSALON) 100 MG capsule, Take  1 capsule (100 mg total) by mouth 3 (three) times daily as needed for cough., Disp: 30 capsule, Rfl: 0   predniSONE (DELTASONE) 20 MG tablet, Take 2 tablets (40 mg total) by mouth daily with breakfast., Disp: 10 tablet, Rfl: 0   albuterol (VENTOLIN HFA) 108 (90 Base) MCG/ACT inhaler, Inhale 2 puffs into the lungs every 4 (four) hours as  needed for wheezing or shortness of breath., Disp: 1 each, Rfl: 0   Budesonide 90 MCG/ACT inhaler, Inhale 1 puff into the lungs 2 (two) times daily., Disp: 1 each, Rfl: 0   fluticasone (FLONASE) 50 MCG/ACT nasal spray, Place 2 sprays into both nostrils daily., Disp: 16 g, Rfl: 0   hydrOXYzine (ATARAX) 10 MG tablet, Take 1 tablet (10 mg total) by mouth 2 (two) times daily as needed for anxiety., Disp: 10 tablet, Rfl: 0   levothyroxine (SYNTHROID) 100 MCG tablet, Take 1 tablet (100 mcg total) by mouth daily before breakfast., Disp: 90 tablet, Rfl: 0   metFORMIN (GLUCOPHAGE) 500 MG tablet, 1 with breakfast and 1 with lunch daily, Disp: 180 tablet, Rfl: 0   methocarbamol (ROBAXIN) 500 MG tablet, Take 1 tablet (500 mg total) by mouth 3 (three) times daily., Disp: 21 tablet, Rfl: 0   sertraline (ZOLOFT) 100 MG tablet, Take 1 tablet (100 mg total) by mouth daily., Disp: 90 tablet, Rfl: 0   Vitamin D, Ergocalciferol, (DRISDOL) 1.25 MG (50000 UNIT) CAPS capsule, Take 1 capsule (50,000 Units total) by mouth every 3 (three) days., Disp: 8 capsule, Rfl: 0  Observations/Objective: Patient is well-developed, well-nourished in no acute distress.  Resting comfortably at home.  Head is normocephalic, atraumatic.  No labored breathing. Speech is clear and coherent with logical content.  Patient is alert and oriented at baseline.   Assessment and Plan: 1. Acute bacterial bronchitis - azithromycin (ZITHROMAX) 250 MG tablet; Take 2 tablets on day 1, then 1 tablet daily on days 2 through 5  Dispense: 6 tablet; Refill: 0 - benzonatate (TESSALON) 100 MG capsule; Take 1 capsule (100 mg total) by mouth 3 (three) times daily as needed for cough.  Dispense: 30 capsule; Refill: 0 - predniSONE (DELTASONE) 20 MG tablet; Take 2 tablets (40 mg total) by mouth daily with breakfast.  Dispense: 10 tablet; Refill: 0  Rx Azithromycin.  Increase fluids.  Rest.  Saline nasal spray.  Probiotic.  Mucinex as directed.  Humidifier in  bedroom. Prednisone and Tessalon per orders.  Call or return to clinic if symptoms are not improving.   Follow Up Instructions: I discussed the assessment and treatment plan with the patient. The patient was provided an opportunity to ask questions and all were answered. The patient agreed with the plan and demonstrated an understanding of the instructions.  A copy of instructions were sent to the patient via MyChart unless otherwise noted below.   The patient was advised to call back or seek an in-person evaluation if the symptoms worsen or if the condition fails to improve as anticipated.  Time:  I spent 10 minutes with the patient via telehealth technology discussing the above problems/concerns.    [name], PA-C

## 2022-12-24 NOTE — Patient Instructions (Signed)
Sherry Schmidt, thank you for joining [name], PA-C for today's virtual visit.  While this provider is not your primary care provider (PCP), if your PCP is located in our provider database this encounter information will be shared with them immediately following your visit.   A [facility] MyChart account gives you access to today's visit and all your visits, tests, and labs performed at [facility] " click here if you don't have a [facility] MyChart account or go to [email or web address]  Consent: (Patient) Sherry Schmidt provided verbal consent for this virtual visit at the beginning of the encounter.  Current Medications:  Current Outpatient Medications:    azithromycin (ZITHROMAX) 250 MG tablet, Take 2 tablets on day 1, then 1 tablet daily on days 2 through 5, Disp: 6 tablet, Rfl: 0   benzonatate (TESSALON) 100 MG capsule, Take 1 capsule (100 mg total) by mouth 3 (three) times daily as needed for cough., Disp: 30 capsule, Rfl: 0   predniSONE (DELTASONE) 20 MG tablet, Take 2 tablets (40 mg total) by mouth daily with breakfast., Disp: 10 tablet, Rfl: 0   albuterol (VENTOLIN HFA) 108 (90 Base) MCG/ACT inhaler, Inhale 2 puffs into the lungs every 4 (four) hours as needed for wheezing or shortness of breath., Disp: 1 each, Rfl: 0   Budesonide 90 MCG/ACT inhaler, Inhale 1 puff into the lungs 2 (two) times daily., Disp: 1 each, Rfl: 0   fluticasone (FLONASE) 50 MCG/ACT nasal spray, Place 2 sprays into both nostrils daily., Disp: 16 g, Rfl: 0   hydrOXYzine (ATARAX) 10 MG tablet, Take 1 tablet (10 mg total) by mouth 2 (two) times daily as needed for anxiety., Disp: 10 tablet, Rfl: 0   levothyroxine (SYNTHROID) 100 MCG tablet, Take 1 tablet (100 mcg total) by mouth daily before breakfast., Disp: 90 tablet, Rfl: 0   metFORMIN (GLUCOPHAGE) 500 MG tablet, 1 with breakfast and 1 with lunch daily, Disp: 180 tablet, Rfl: 0   methocarbamol (ROBAXIN) 500 MG tablet, Take 1  tablet (500 mg total) by mouth 3 (three) times daily., Disp: 21 tablet, Rfl: 0   sertraline (ZOLOFT) 100 MG tablet, Take 1 tablet (100 mg total) by mouth daily., Disp: 90 tablet, Rfl: 0   Vitamin D, Ergocalciferol, (DRISDOL) 1.25 MG (50000 UNIT) CAPS capsule, Take 1 capsule (50,000 Units total) by mouth every 3 (three) days., Disp: 8 capsule, Rfl: 0   Medications ordered in this encounter:  Meds ordered this encounter  Medications   azithromycin (ZITHROMAX) 250 MG tablet    Sig: Take 2 tablets on day 1, then 1 tablet daily on days 2 through 5    Dispense:  6 tablet    Refill:  0    Order Specific Question:   Supervising Provider    Answer:   Merrilee Jansky [[id number]]   benzonatate (TESSALON) 100 MG capsule    Sig: Take 1 capsule (100 mg total) by mouth 3 (three) times daily as needed for cough.    Dispense:  30 capsule    Refill:  0    Order Specific Question:   Supervising Provider    Answer:   Merrilee Jansky [[id number]]   predniSONE (DELTASONE) 20 MG tablet    Sig: Take 2 tablets (40 mg total) by mouth daily with breakfast.    Dispense:  10 tablet    Refill:  0    Order Specific Question:   Supervising Provider    Answer:   Merrilee Jansky [[id number]]     *  If you need refills on other medications prior to your next appointment, please contact your pharmacy*  Follow-Up: Call back or seek an in-person evaluation if the symptoms worsen or if the condition fails to improve as anticipated.  [facility] [phone]  Other Instructions Take antibiotic (Azithromycin) as directed.  Increase fluids.  Get plenty of rest. Use Mucinex for congestion. Use the prednisone and tessalon as directed. Take a daily probiotic (I recommend Align or Culturelle, but even Activia Yogurt may be beneficial).  A humidifier placed in the bedroom may offer some relief for a dry, scratchy throat of nasal irritation.  Read information below on acute bronchitis. Please call or return to clinic  if symptoms are not improving.  Acute Bronchitis Bronchitis is when the airways that extend from the windpipe into the lungs get red, puffy, and painful (inflamed). Bronchitis often causes thick spit (mucus) to develop. This leads to a cough. A cough is the most common symptom of bronchitis. In acute bronchitis, the condition usually begins suddenly and goes away over time (usually in 2 weeks). Smoking, allergies, and asthma can make bronchitis worse. Repeated episodes of bronchitis may cause more lung problems.  HOME CARE Rest. Drink enough fluids to keep your pee (urine) clear or pale yellow (unless you need to limit fluids as told by your doctor). Only take over-the-counter or prescription medicines as told by your doctor. Avoid smoking and secondhand smoke. These can make bronchitis worse. If you are a smoker, think about using nicotine gum or skin patches. Quitting smoking will help your lungs heal faster. Reduce the chance of getting bronchitis again by: Washing your hands often. Avoiding people with cold symptoms. Trying not to touch your hands to your mouth, nose, or eyes. Follow up with your doctor as told.  GET HELP IF: Your symptoms do not improve after 1 week of treatment. Symptoms include: Cough. Fever. Coughing up thick spit. Body aches. Chest congestion. Chills. Shortness of breath. Sore throat.  GET HELP RIGHT AWAY IF:  You have an increased fever. You have chills. You have severe shortness of breath. You have bloody thick spit (sputum). You throw up (vomit) often. You lose too much body fluid (dehydration). You have a severe headache. You faint.  MAKE SURE YOU:  Understand these instructions. Will watch your condition. Will get help right away if you are not doing well or get worse. Document Released: 09/04/2007 Document Revised: 11/18/2012 Document Reviewed: 09/08/2012 [facility] Patient Information 2015 [place], [place]. This information is not intended to  replace advice given to you by your health care provider. Make sure you discuss any questions you have with your health care provider.    If you have been instructed to have an in-person evaluation today at a local Urgent Care facility, please use the link below. It will take you to a list of all of our available [facility], including address, phone number and hours of operation. Please do not delay care.  [facility]  If you or a family member do not have a primary care provider, use the link below to schedule a visit and establish care. When you choose a [facility] primary care physician or advanced practice provider, you gain a long-term partner in health. Find a Primary Care Provider  Learn more about [facility] in-office and virtual care options: [facility] Get Care Now

## 2022-12-30 ENCOUNTER — Ambulatory Visit (HOSPITAL_COMMUNITY): Payer: 59

## 2023-01-07 ENCOUNTER — Telehealth: Payer: 59 | Admitting: Physician Assistant

## 2023-01-07 DIAGNOSIS — B379 Candidiasis, unspecified: Secondary | ICD-10-CM | POA: Diagnosis not present

## 2023-01-07 DIAGNOSIS — T3695XA Adverse effect of unspecified systemic antibiotic, initial encounter: Secondary | ICD-10-CM | POA: Diagnosis not present

## 2023-01-07 MED ORDER — FLUCONAZOLE 150 MG PO TABS: 150.0000 mg | ORAL_TABLET | ORAL | 0 refills | Status: DC | PRN

## 2023-01-07 NOTE — Progress Notes (Signed)

## 2023-01-08 ENCOUNTER — Encounter: Payer: Self-pay | Admitting: Physician Assistant

## 2023-01-14 ENCOUNTER — Ambulatory Visit
Admission: EM | Admit: 2023-01-14 | Discharge: 2023-01-14 | Disposition: A | Discharge status: Home / Self Care | Payer: 59 | Attending: Emergency Medicine | Admitting: Emergency Medicine

## 2023-01-14 DIAGNOSIS — S71119A Laceration without foreign body, unspecified thigh, initial encounter: Secondary | ICD-10-CM | POA: Diagnosis not present

## 2023-01-14 MED ORDER — MUPIROCIN CALCIUM 2 % EX CREA: 1.0000 | TOPICAL_CREAM | Freq: Two times a day (BID) | CUTANEOUS | 0 refills | Status: DC

## 2023-01-14 NOTE — Discharge Instructions (Signed)
I have sent a prescription for mupirocin ointment to your pharmacy.  Please apply the ointment to the lesions on your right thigh twice daily until they are healed.  Please follow-up with Dr. Sharee Holster as soon as possible regarding this urgent care visit to discuss treatment options.    Thank you for visiting [facility] today.  I appreciate the opportunity to precipitate your care.

## 2023-01-14 NOTE — ED Triage Notes (Signed)
Pt presents with cuts to the rt thigh X 4 days. States they might be infected. Cuts are red, swollen and warm to the touch. Pt states she washed the area with antibacterial soap.

## 2023-01-14 NOTE — ED Provider Notes (Signed)
[facility]    CSN: [id number] Arrival date & time: 01/14/23  4034    HISTORY   Chief Complaint  Patient presents with   cuts on rt leg   HPI Sherry Schmidt is a pleasant, 27 y.o. female who presents to urgent care today. Pt presents with razor blade cuts to the rt thigh that occurred 4 days ago.  States she is concerned they might be infected because the cuts are red, swollen and warm to the touch. Pt states she washed the area with antibacterial soap initially but has not applied anything to the cut since then.   The history is provided by the patient.   Past Medical History:  Diagnosis Date   Asthma    Back pain    Back pain    Depression    Panic attacks    Thyroid disease    Patient Active Problem List   Diagnosis Date Noted   BMI 33.0-33.9,adult, Current BMI 33.0 05/23/2022   Obesity (HCC)- Start BMI 41.16 05/23/2022   Pre-diabetes 09/27/2021   Hypothyroidism 05/12/2020   Transaminitis 03/06/2020   Abnormal thyroid blood test 03/06/2020   Mixed hyperlipidemia 03/06/2020   Vitamin D deficiency 02/18/2020   Insulin resistance 02/18/2020   Depression with anxiety 02/18/2020   Class 3 severe obesity with serious comorbidity and body mass index (BMI) of 40.0 to 44.9 in adult [facility]) 02/18/2020   Fatigue 11/06/2016   Postprocedural hypothyroidism 10/16/2016   Past Surgical History:  Procedure Laterality Date   THYROIDECTOMY     OB History     Gravida  0   Para  0   Term  0   Preterm  0   AB  0   Living  0      SAB  0   IAB  0   Ectopic  0   Multiple  0   Live Births  0          Home Medications    Prior to Admission medications   Medication Sig Start Date End Date Taking? Authorizing Provider  albuterol (VENTOLIN HFA) 108 (90 Base) MCG/ACT inhaler Inhale 2 puffs into the lungs every 4 (four) hours as needed for wheezing or shortness of breath. 07/04/22   Zenia Resides, MD  benzonatate (TESSALON) 100 MG capsule Take 1  capsule (100 mg total) by mouth 3 (three) times daily as needed for cough. 12/24/22   Waldon Merl, PA-C  Budesonide 90 MCG/ACT inhaler Inhale 1 puff into the lungs 2 (two) times daily. 11/05/22   Waldon Merl, PA-C  fluconazole (DIFLUCAN) 150 MG tablet Take 1 tablet (150 mg total) by mouth every 3 (three) days as needed. 01/07/23   Margaretann Loveless, PA-C  fluticasone (FLONASE) 50 MCG/ACT nasal spray Place 2 sprays into both nostrils daily. 12/18/22   Waldon Merl, PA-C  hydrOXYzine (ATARAX) 10 MG tablet Take 1 tablet (10 mg total) by mouth 2 (two) times daily as needed for anxiety. 10/15/22   Zenia Resides, MD  levothyroxine (SYNTHROID) 100 MCG tablet Take 1 tablet (100 mcg total) by mouth daily before breakfast. 10/21/22   Opalski, Gavin Pound, DO  metFORMIN (GLUCOPHAGE) 500 MG tablet 1 with breakfast and 1 with lunch daily 10/21/22   Opalski, Gavin Pound, DO  methocarbamol (ROBAXIN) 500 MG tablet Take 1 tablet (500 mg total) by mouth 3 (three) times daily. 12/10/22   Rodriguez-Southworth, Nettie Elm, PA-C  mupirocin cream (BACTROBAN) 2 % Apply 1 Application topically 2 (two) times  daily. 01/14/23  Yes Theadora Rama Scales, PA-C  predniSONE (DELTASONE) 20 MG tablet Take 2 tablets (40 mg total) by mouth daily with breakfast. 12/24/22   Waldon Merl, PA-C  sertraline (ZOLOFT) 100 MG tablet Take 1 tablet (100 mg total) by mouth daily. 10/21/22   Opalski, Gavin Pound, DO  Vitamin D, Ergocalciferol, (DRISDOL) 1.25 MG (50000 UNIT) CAPS capsule Take 1 capsule (50,000 Units total) by mouth every 3 (three) days. 08/14/20   Alois Cliche, PA-C    Family History Family History  Problem Relation Age of Onset   Hypertension Mother    Depression Mother    Bipolar disorder Mother    Drug abuse Mother    Obesity Mother    Diabetes Father    Thyroid disease Father    Cancer Father    Depression Father    Anxiety disorder Father    Bipolar disorder Father    Schizophrenia Father    Liver disease  Father    Sleep apnea Father    Alcohol abuse Father    Drug abuse Father    Obesity Father    Social History Social History   Tobacco Use   Smoking status: Never   Smokeless tobacco: Never  Vaping Use   Vaping status: Never Used  Substance Use Topics   Alcohol use: Yes    Comment: occasional   Drug use: No   Allergies   Patient has no known allergies.  Review of Systems Review of Systems Pertinent findings revealed after performing a 14 point review of systems has been noted in the history of present illness.  Physical Exam Vital Signs BP (!) 132/90 (BP Location: Left Arm)   Pulse 70   Temp 98.2 F (36.8 C) (Tympanic)   Resp 17   LMP 01/09/2023 (Exact Date)   SpO2 98%   No data found.  Physical Exam Vitals and nursing note reviewed.  Constitutional:      General: She is not in acute distress.    Appearance: Normal appearance.  HENT:     Head: Normocephalic and atraumatic.  Eyes:     Pupils: Pupils are equal, round, and reactive to light.  Cardiovascular:     Rate and Rhythm: Normal rate and regular rhythm.  Pulmonary:     Effort: Pulmonary effort is normal.     Breath sounds: Normal breath sounds.  Musculoskeletal:        General: Normal range of motion.     Cervical back: Normal range of motion and neck supple.  Skin:    General: Skin is warm and dry.     Findings: Lesion (Multiple superficial lacerations on right upper thigh) present.  Neurological:     General: No focal deficit present.     Mental Status: She is alert and oriented to person, place, and time. Mental status is at baseline.  Psychiatric:        Mood and Affect: Mood normal.        Behavior: Behavior normal.        Thought Content: Thought content normal.        Judgment: Judgment normal.     Visual Acuity Right Eye Distance:   Left Eye Distance:   Bilateral Distance:    Right Eye Near:   Left Eye Near:    Bilateral Near:     UC Couse / Diagnostics / Procedures:      Radiology No results found.  Procedures Procedures (including critical care time) EKG  Pending results:  Labs Reviewed - No data to display  Medications Ordered in UC: Medications - No data to display  UC Diagnoses / Final Clinical Impressions(s)   I have reviewed the triage vital signs and the nursing notes.  Pertinent labs & imaging results that were available during my care of the patient were reviewed by me and considered in my medical decision making (see chart for details).    Final diagnoses:  Superficial laceration of thigh   Patient provided with a prescription for mupirocin cream to apply twice daily to lesions.  After talking with patient, it appears that the cuts are self-inflicted.  I have advised her to make sure she continues to take her Zoloft daily, which she states she is taking, and to follow-up with behavioral health at her primary care provider.  Patient verbalizes that she will do so.  Patient denies suicidal ideation, suicidal plan.  Please see discharge instructions below for details of plan of care as provided to patient. ED Prescriptions     Medication Sig Dispense Auth. Provider   mupirocin cream (BACTROBAN) 2 % Apply 1 Application topically 2 (two) times daily. 30 g Theadora Rama Scales, PA-C      PDMP not reviewed this encounter.  Pending results:  Labs Reviewed - No data to display  Discharge Instructions:   Discharge Instructions      I have sent a prescription for mupirocin ointment to your pharmacy.  Please apply the ointment to the lesions on your right thigh twice daily until they are healed.  Please follow-up with Dr. Sharee Holster as soon as possible regarding this urgent care visit to discuss treatment options.    Thank you for visiting [facility] today.  I appreciate the opportunity to precipitate your care.    Disposition Upon Discharge:  Condition: stable for discharge home  Patient presented with an acute  illness with associated systemic symptoms and significant discomfort requiring urgent management. In my opinion, this is a condition that a prudent lay person (someone who possesses an average knowledge of health and medicine) may potentially expect to result in complications if not addressed urgently such as respiratory distress, impairment of bodily function or dysfunction of bodily organs.   Routine symptom specific, illness specific and/or disease specific instructions were discussed with the patient and/or caregiver at length.   As such, the patient has been evaluated and assessed, work-up was performed and treatment was provided in alignment with urgent care protocols and evidence based medicine.  Patient/parent/caregiver has been advised that the patient may require follow up for further testing and treatment if the symptoms continue in spite of treatment, as clinically indicated and appropriate.  Patient/parent/caregiver has been advised to return to the [facility] or PCP if no better; to PCP or the Emergency Department if new signs and symptoms develop, or if the current signs or symptoms continue to change or worsen for further workup, evaluation and treatment as clinically indicated and appropriate  The patient will follow up with their current PCP if and as advised. If the patient does not currently have a PCP we will assist them in obtaining one.   The patient may need specialty follow up if the symptoms continue, in spite of conservative treatment and management, for further workup, evaluation, consultation and treatment as clinically indicated and appropriate.  Patient/parent/caregiver verbalized understanding and agreement of plan as discussed.  All questions were addressed during visit.  Please see discharge instructions below for further details of plan.  This office  note has been dictated using Teaching laboratory technician.  Unfortunately, this method of dictation can sometimes lead  to typographical or grammatical errors.  I apologize for your inconvenience in advance if this occurs.  Please do not hesitate to reach out to me if clarification is needed.      Theadora Rama Scales, [place] 01/14/23 [phone]

## 2023-01-23 ENCOUNTER — Telehealth: Payer: 59 | Admitting: Physician Assistant

## 2023-01-23 DIAGNOSIS — H60392 Other infective otitis externa, left ear: Secondary | ICD-10-CM | POA: Diagnosis not present

## 2023-01-23 MED ORDER — AMOXICILLIN-POT CLAVULANATE 875-125 MG PO TABS
1.0000 | ORAL_TABLET | Freq: Two times a day (BID) | ORAL | 0 refills | Status: DC
Start: 2023-01-23 — End: 2023-03-10

## 2023-01-23 MED ORDER — CIPRO HC 0.2-1 % OT SUSP: 3.0000 [drp] | Freq: Two times a day (BID) | OTIC | 0 refills | Status: AC

## 2023-01-23 NOTE — Progress Notes (Signed)
E Visit for Ear Pain - Swimmer's Ear  We are sorry that you are not feeling well. Here is how we plan to help!  Based on what you have shared with me it looks like you have Swimmer's Ear.  Swimmer's ear is a redness or swelling, irritation, or infection of your outer ear canal. These symptoms usually occur within a few days of swimming. Your ear canal is a tube that goes from the opening of the ear to the eardrum.  When water stays in your ear canal, germs can grow.  This is a painful condition that often happens to children and swimmers of all ages.  It is not contagious and oral antibiotics are not required to treat uncomplicated swimmer's ear.  The usual symptoms include:    Itchiness inside the ear  Redness or a sense of swelling in the ear  Pain when the ear is tugged on when pressure is placed on the ear  Pus draining from the infected ear   I have prescribed Augmentin 875-125 mg one tablet by mouth twice a day for 10 days  I have prescribed: Ciprofloxin 0.2% and hydrocortisone 1% otic suspension 3 drops in affected ears twice daily for 7 days  In certain cases, swimmer's ear may progress to a more serious bacterial infection of the middle or inner ear.  If you have a fever 102 and up and significantly worsening symptoms, this could indicate a more serious infection moving to the middle/inner and needs face to face evaluation in an office by a provider.  Your symptoms should improve over the next 3 days and should resolve in about 7 days.  Be sure to complete ALL of your prescription.  I have sent a work note to Pharmacologist. You can find by going to the Menu on your homepage, scrolling down to the Communications section, and selecting Letters. Let us know if you have any issue locating. Take care and feel better soon!  HOME CARE: Wash your hands frequently. If you are prescribed an ear drop, do not place the tip of the bottle on your ear or touch it with your fingers. You can take  Acetaminophen 650 mg every 4-6 hours as needed for pain.  If pain is severe or moderate, you can apply a heating pad (set on low) or hot water bottle (wrapped in a towel) to outer ear for 20 minutes.  This will also increase drainage. Avoid ear plugs Do not go swimming until the symptoms are gone Do not use Q-tips After showers, help the water run out by tilting your head to one side.   GET HELP RIGHT AWAY IF: Fever is over 102.2 degrees. You develop progressive ear pain or hearing loss. Ear symptoms persist longer than 3 days after treatment.  MAKE SURE YOU: Understand these instructions. Will watch your condition. Will get help right away if you are not doing well or get worse.  TO PREVENT SWIMMER'S EAR: Use a bathing cap or custom fitted swim molds to keep your ears dry. Towel off after swimming to dry your ears. Tilt your head or pull your earlobes to allow the water to escape your ear canal. If there is still water in your ears, consider using a hairdryer on the lowest setting.  Thank you for choosing an e-visit.  Your e-visit answers were reviewed by a board certified advanced clinical practitioner to complete your personal care plan. Depending upon the condition, your plan could have included both over the counter  or prescription medications.  Please review your pharmacy choice. Make sure the pharmacy is open so you can pick up the prescription now. If there is a problem, you may contact your provider through [employer] and have the prescription routed to another pharmacy.  Your safety is important to [place]. If you have drug allergies check your prescription carefully.   For the next 24 hours you can use MyChart to ask questions about today's visit, request a non-urgent call back, or ask for a work or school excuse. You will get an email with a survey after your eVisit asking about your experience. We would appreciate your feedback. I hope that your e-visit has been valuable  and will aid in your recovery.

## 2023-01-23 NOTE — Progress Notes (Signed)
I have spent 5 minutes in review of e-visit questionnaire, review and updating patient chart, medical decision making and response to patient.   Mia Milan Cody Jacklynn Dehaas, PA-C    

## 2023-02-03 ENCOUNTER — Telehealth: Payer: Self-pay

## 2023-02-03 NOTE — Telephone Encounter (Signed)
[facility] called to request rx mupirocin cream to be changed to an ointment-Mani, PA-C reviewed chart and approved change

## 2023-02-04 ENCOUNTER — Ambulatory Visit
Admission: EM | Admit: 2023-02-04 | Discharge: 2023-02-04 | Disposition: A | Discharge status: Home / Self Care | Payer: 59 | Attending: Internal Medicine | Admitting: Internal Medicine

## 2023-02-04 ENCOUNTER — Ambulatory Visit: Payer: 59

## 2023-02-04 DIAGNOSIS — M5459 Other low back pain: Secondary | ICD-10-CM

## 2023-02-04 DIAGNOSIS — M545 Low back pain, unspecified: Secondary | ICD-10-CM

## 2023-02-04 DIAGNOSIS — R109 Unspecified abdominal pain: Secondary | ICD-10-CM

## 2023-02-04 MED ORDER — CYCLOBENZAPRINE HCL 5 MG PO TABS: 5.0000 mg | ORAL_TABLET | Freq: Three times a day (TID) | ORAL | 0 refills | Status: DC | PRN

## 2023-02-04 MED ORDER — NAPROXEN 500 MG PO TABS: 500.0000 mg | ORAL_TABLET | Freq: Two times a day (BID) | ORAL | 0 refills | Status: DC

## 2023-02-04 NOTE — ED Triage Notes (Signed)
Pt c/o right lower back/flank pain started yesterday-denies injury-last dose ibuprofen 9pm yesterday-NAD-steady gait

## 2023-02-04 NOTE — ED Notes (Signed)
Patient unable to give urine sample at this time. Patient given water after xrays were performed.

## 2023-02-04 NOTE — Discharge Instructions (Addendum)
Start naproxen twice daily with food for pain and inflammation. Use cyclobenzaprine as a muscle relaxant, 3 times daily unless it makes you sleepy. If that's the case then just take it at bedtime.   If your pain continues, then go see [facility] for more work up.

## 2023-02-04 NOTE — ED Provider Notes (Signed)
[facility]  Note:  This document was prepared using Conservation officer, historic buildings and may include unintentional dictation errors.  MRN: [id number] DOB: Aug 03, 1995  Subjective:   Sherry Schmidt is a 27 y.o. female presenting for 1 day history of acute onset mid to right-sided lower back pain, right-sided flank pain.  Has also had left-sided low back pain to a lesser degree.  No fall, trauma, numbness or tingling, saddle paresthesia, changes to bowel or urinary habits, radicular symptoms.  No history of kidney stones.  No current facility-administered medications for this encounter.  Current Outpatient Medications:    albuterol (VENTOLIN HFA) 108 (90 Base) MCG/ACT inhaler, Inhale 2 puffs into the lungs every 4 (four) hours as needed for wheezing or shortness of breath., Disp: 1 each, Rfl: 0   amoxicillin-clavulanate (AUGMENTIN) 875-125 MG tablet, Take 1 tablet by mouth 2 (two) times daily., Disp: 14 tablet, Rfl: 0   benzonatate (TESSALON) 100 MG capsule, Take 1 capsule (100 mg total) by mouth 3 (three) times daily as needed for cough., Disp: 30 capsule, Rfl: 0   Budesonide 90 MCG/ACT inhaler, Inhale 1 puff into the lungs 2 (two) times daily., Disp: 1 each, Rfl: 0   fluconazole (DIFLUCAN) 150 MG tablet, Take 1 tablet (150 mg total) by mouth every 3 (three) days as needed., Disp: 2 tablet, Rfl: 0   fluticasone (FLONASE) 50 MCG/ACT nasal spray, Place 2 sprays into both nostrils daily., Disp: 16 g, Rfl: 0   hydrOXYzine (ATARAX) 10 MG tablet, Take 1 tablet (10 mg total) by mouth 2 (two) times daily as needed for anxiety., Disp: 10 tablet, Rfl: 0   levothyroxine (SYNTHROID) 100 MCG tablet, Take 1 tablet (100 mcg total) by mouth daily before breakfast., Disp: 90 tablet, Rfl: 0   metFORMIN (GLUCOPHAGE) 500 MG tablet, 1 with breakfast and 1 with lunch daily, Disp: 180 tablet, Rfl: 0   methocarbamol (ROBAXIN) 500 MG tablet, Take 1 tablet (500 mg total) by mouth 3 (three)  times daily., Disp: 21 tablet, Rfl: 0   mupirocin cream (BACTROBAN) 2 %, Apply 1 Application topically 2 (two) times daily., Disp: 30 g, Rfl: 0   predniSONE (DELTASONE) 20 MG tablet, Take 2 tablets (40 mg total) by mouth daily with breakfast., Disp: 10 tablet, Rfl: 0   sertraline (ZOLOFT) 100 MG tablet, Take 1 tablet (100 mg total) by mouth daily., Disp: 90 tablet, Rfl: 0   Vitamin D, Ergocalciferol, (DRISDOL) 1.25 MG (50000 UNIT) CAPS capsule, Take 1 capsule (50,000 Units total) by mouth every 3 (three) days., Disp: 8 capsule, Rfl: 0   No Known Allergies  Past Medical History:  Diagnosis Date   Asthma    Back pain    Back pain    Depression    Panic attacks    Thyroid disease      Past Surgical History:  Procedure Laterality Date   THYROIDECTOMY      Family History  Problem Relation Age of Onset   Hypertension Mother    Depression Mother    Bipolar disorder Mother    Drug abuse Mother    Obesity Mother    Diabetes Father    Thyroid disease Father    Cancer Father    Depression Father    Anxiety disorder Father    Bipolar disorder Father    Schizophrenia Father    Liver disease Father    Sleep apnea Father    Alcohol abuse Father    Drug abuse Father  Obesity Father     Social History   Tobacco Use   Smoking status: Never   Smokeless tobacco: Never  Vaping Use   Vaping status: Never Used  Substance Use Topics   Alcohol use: Yes    Comment: occasional   Drug use: No    ROS   Objective:   Vitals: BP 124/80 (BP Location: Right Arm)   Pulse 65   Temp 98.6 F (37 C) (Oral)   Resp 16   LMP 01/09/2023 (Exact Date)   SpO2 98%   Physical Exam Constitutional:      General: She is not in acute distress.    Appearance: Normal appearance. She is well-developed. She is not ill-appearing, toxic-appearing or diaphoretic.  HENT:     Head: Normocephalic and atraumatic.     Nose: Nose normal.     Mouth/Throat:     Mouth: Mucous membranes are moist.   Eyes:     General: No scleral icterus.       Right eye: No discharge.        Left eye: No discharge.     Extraocular Movements: Extraocular movements intact.     Conjunctiva/sclera: Conjunctivae normal.  Cardiovascular:     Rate and Rhythm: Normal rate and regular rhythm.     Heart sounds: Normal heart sounds. No murmur heard.    No friction rub. No gallop.  Pulmonary:     Effort: Pulmonary effort is normal. No respiratory distress.     Breath sounds: No stridor. No wheezing, rhonchi or rales.  Chest:     Chest wall: No tenderness.  Abdominal:     General: Bowel sounds are normal. There is no distension.     Palpations: Abdomen is soft. There is no mass.     Tenderness: There is no abdominal tenderness. There is no right CVA tenderness, left CVA tenderness, guarding or rebound.  Musculoskeletal:     Lumbar back: Spasms and tenderness (paraspinal) present. No swelling, edema, deformity, signs of trauma, lacerations or bony tenderness. Normal range of motion. Negative right straight leg raise test and negative left straight leg raise test. No scoliosis.  Skin:    General: Skin is warm and dry.  Neurological:     General: No focal deficit present.     Mental Status: She is alert and oriented to person, place, and time.     Motor: No weakness.     Coordination: Coordination normal.     Gait: Gait normal.     Deep Tendon Reflexes: Reflexes normal.  Psychiatric:        Mood and Affect: Mood normal.        Behavior: Behavior normal.        Thought Content: Thought content normal.        Judgment: Judgment normal.    DG Lumbar Spine Complete  Result Date: 02/04/2023 CLINICAL DATA:  Low back pain and flank pain x2 days. EXAM: LUMBAR SPINE - COMPLETE 4+ VIEW COMPARISON:  Lumbar spine radiographs 10/13/2017. FINDINGS: Five views of the lumbar spine. Conventional numbering is assumed with 5 non-rib-bearing, lumbar type vertebral bodies. No evidence of lumbar spine fracture. Alignment is  normal. Unchanged mild disc height loss at L5-S1. IMPRESSION: Unchanged mild disc height loss at L5-S1. No acute findings. Electronically Signed   By: Orvan Falconer M.D.   On: 02/04/2023 10:40    Patient unable to provide urine sample in clinic.  Assessment and Plan :   PDMP not reviewed this encounter.  1. Acute bilateral low back pain without sciatica   2. Left flank pain     Recommended conservative management with naproxen, cyclobenzaprine.  Follow-up with [facility] especially as she has longstanding history of intermittent back pains.  Would benefit from consideration of an MRI.  Counseled patient on potential for adverse effects with medications prescribed/recommended today, ER and return-to-clinic precautions discussed, patient verbalized understanding.    Wallis Bamberg, [place] 02/04/23 [phone]

## 2023-03-03 ENCOUNTER — Telehealth: Payer: 59 | Admitting: Physician Assistant

## 2023-03-03 DIAGNOSIS — J069 Acute upper respiratory infection, unspecified: Secondary | ICD-10-CM

## 2023-03-03 MED ORDER — IPRATROPIUM BROMIDE 0.03 % NA SOLN: 2.0000 | Freq: Two times a day (BID) | NASAL | 0 refills | Status: DC

## 2023-03-03 MED ORDER — BENZONATATE 100 MG PO CAPS
100.0000 mg | ORAL_CAPSULE | Freq: Three times a day (TID) | ORAL | 0 refills | Status: DC | PRN
Start: 2023-03-03 — End: 2023-04-09

## 2023-03-03 NOTE — Progress Notes (Signed)
E-Visit for Upper Respiratory Infection   We are sorry you are not feeling well.  Here is how we plan to help!  Based on what you have shared with me, it looks like you may have a viral upper respiratory infection.  Upper respiratory infections are caused by a large number of viruses; however, rhinovirus is the most common cause.   Symptoms vary from person to person, with common symptoms including sore throat, cough, fatigue or lack of energy and feeling of general discomfort.  A low-grade fever of up to 100.4 may present, but is often uncommon.  Symptoms vary however, and are closely related to a person's age or underlying illnesses.  The most common symptoms associated with an upper respiratory infection are nasal discharge or congestion, cough, sneezing, headache and pressure in the ears and face.  These symptoms usually persist for about 3 to 10 days, but can last up to 2 weeks.  It is important to know that upper respiratory infections do not cause serious illness or complications in most cases.    Upper respiratory infections can be transmitted from person to person, with the most common method of transmission being a person's hands.  The virus is able to live on the skin and can infect other persons for up to 2 hours after direct contact.  Also, these can be transmitted when someone coughs or sneezes; thus, it is important to cover the mouth to reduce this risk.  To keep the spread of the illness at bay, good hand hygiene is very important.  This is an infection that is most likely caused by a virus. There are no specific treatments other than to help you with the symptoms until the infection runs its course.  We are sorry you are not feeling well.  Here is how we plan to help!   For nasal congestion, you may use an oral decongestants such as Mucinex D or if you have glaucoma or high blood pressure use plain Mucinex.  Saline nasal spray or nasal drops can help and can safely be used as often as  needed for congestion.  For your congestion, I have prescribed Ipratropium Bromide nasal spray 0.03% two sprays in each nostril 2-3 times a day  If you do not have a history of heart disease, hypertension, diabetes or thyroid disease, prostate/bladder issues or glaucoma, you may also use Sudafed to treat nasal congestion.  It is highly recommended that you consult with a pharmacist or your primary care physician to ensure this medication is safe for you to take.     If you have a cough, you may use cough suppressants such as Delsym and Robitussin.  If you have glaucoma or high blood pressure, you can also use Coricidin HBP.   For cough I have prescribed for you A prescription cough medication called Tessalon Perles 100 mg. You may take 1-2 capsules every 8 hours as needed for cough  If you have a sore or scratchy throat, use a saltwater gargle-  to  teaspoon of salt dissolved in a 4-ounce to 8-ounce glass of warm water.  Gargle the solution for approximately 15-30 seconds and then spit.  It is important not to swallow the solution.  You can also use throat lozenges/cough drops and Chloraseptic spray to help with throat pain or discomfort.  Warm or cold liquids can also be helpful in relieving throat pain.  For headache, pain or general discomfort, you can use Ibuprofen or Tylenol as directed.     Some authorities believe that zinc sprays or the use of Echinacea may shorten the course of your symptoms.   HOME CARE Only take medications as instructed by your medical team. Be sure to drink plenty of fluids. Water is fine as well as fruit juices, sodas and electrolyte beverages. You may want to stay away from caffeine or alcohol. If you are nauseated, try taking small sips of liquids. How do you know if you are getting enough fluid? Your urine should be a pale yellow or almost colorless. Get rest. Taking a steamy shower or using a humidifier may help nasal congestion and ease sore throat pain. You can  place a towel over your head and breathe in the steam from hot water coming from a faucet. Using a saline nasal spray works much the same way. Cough drops, hard candies and sore throat lozenges may ease your cough. Avoid close contacts especially the very young and the elderly Cover your mouth if you cough or sneeze Always remember to wash your hands.   GET HELP RIGHT AWAY IF: You develop worsening fever. If your symptoms do not improve within 10 days You develop yellow or green discharge from your nose over 3 days. You have coughing fits You develop a severe head ache or visual changes. You develop shortness of breath, difficulty breathing or start having chest pain Your symptoms persist after you have completed your treatment plan  MAKE SURE YOU  Understand these instructions. Will watch your condition. Will get help right away if you are not doing well or get worse.  Thank you for choosing an e-visit.  Your e-visit answers were reviewed by a board certified advanced clinical practitioner to complete your personal care plan. Depending upon the condition, your plan could have included both over the counter or prescription medications.  Please review your pharmacy choice. Make sure the pharmacy is open so you can pick up prescription now. If there is a problem, you may contact your provider through MyChart messaging and have the prescription routed to another pharmacy.  Your safety is important to us. If you have drug allergies check your prescription carefully.   For the next 24 hours you can use MyChart to ask questions about today's visit, request a non-urgent call back, or ask for a work or school excuse. You will get an email in the next two days asking about your experience. I hope that your e-visit has been valuable and will speed your recovery.  I have spent 5 minutes in review of e-visit questionnaire, review and updating patient chart, medical decision making and response to  patient.   Myan Locatelli M Archimedes Harold, PA-C  

## 2023-03-10 ENCOUNTER — Telehealth: Payer: 59 | Admitting: Physician Assistant

## 2023-03-10 ENCOUNTER — Telehealth: Payer: 59

## 2023-03-10 DIAGNOSIS — B9689 Other specified bacterial agents as the cause of diseases classified elsewhere: Secondary | ICD-10-CM

## 2023-03-10 MED ORDER — AMOXICILLIN-POT CLAVULANATE 875-125 MG PO TABS
1.0000 | ORAL_TABLET | Freq: Two times a day (BID) | ORAL | 0 refills | Status: DC
Start: 2023-03-10 — End: 2023-04-09

## 2023-03-10 NOTE — Progress Notes (Signed)

## 2023-03-18 ENCOUNTER — Telehealth: Payer: 59 | Admitting: Physician Assistant

## 2023-03-18 DIAGNOSIS — M5442 Lumbago with sciatica, left side: Secondary | ICD-10-CM

## 2023-03-18 DIAGNOSIS — M5441 Lumbago with sciatica, right side: Secondary | ICD-10-CM

## 2023-03-18 MED ORDER — NAPROXEN 500 MG PO TABS
500.0000 mg | ORAL_TABLET | Freq: Two times a day (BID) | ORAL | 0 refills | Status: DC
Start: 2023-03-18 — End: 2023-04-09

## 2023-03-18 MED ORDER — BACLOFEN 10 MG PO TABS: 10.0000 mg | ORAL_TABLET | Freq: Three times a day (TID) | ORAL | 0 refills | Status: DC

## 2023-03-18 NOTE — Progress Notes (Signed)
E-Visit for Back Pain   We are sorry that you are not feeling well.  Here is how we plan to help!  Based on what you have shared with me it looks like you mostly have acute back pain.  Acute back pain is defined as musculoskeletal pain that can resolve in 1-3 weeks with conservative treatment.  I have prescribed Naprosyn 500 mg take one by mouth twice a day non-steroid anti-inflammatory (NSAID) as well as Baclofen 10 mg every eight hours as needed which is a muscle relaxer  Some patients experience stomach irritation or in increased heartburn with anti-inflammatory drugs.  Please keep in mind that muscle relaxer's can cause fatigue and should not be taken while at work or driving.  Back pain is very common.  The pain often gets better over time.  The cause of back pain is usually not dangerous.  Most people can learn to manage their back pain on their own.  I have sent a work note to Pharmacologist. You can find by going to the Menu on your homepage, scrolling down to the Communications section, and selecting Letters. Let us know if you have any issue locating. Take care and feel better soon!   Home Care Stay active.  Start with short walks on flat ground if you can.  Try to walk farther each day. Do not sit, drive or stand in one place for more than 30 minutes.  Do not stay in bed. Do not avoid exercise or work.  Activity can help your back heal faster. Be careful when you bend or lift an object.  Bend at your knees, keep the object close to you, and do not twist. Sleep on a firm mattress.  Lie on your side, and bend your knees.  If you lie on your back, put a pillow under your knees. Only take medicines as told by your doctor. Put ice on the injured area. Put ice in a plastic bag Place a towel between your skin and the bag Leave the ice on for 15-20 minutes, 3-4 times a day for the first 2-3 days. 210 After that, you can switch between ice and heat packs. Ask your doctor about back exercises  or massage. Avoid feeling anxious or stressed.  Find good ways to deal with stress, such as exercise.  Get Help Right Way If: Your pain does not go away with rest or medicine. Your pain does not go away in 1 week. You have new problems. You do not feel well. The pain spreads into your legs. You cannot control when you poop (bowel movement) or pee (urinate) You feel sick to your stomach (nauseous) or throw up (vomit) You have belly (abdominal) pain. You feel like you may pass out (faint). If you develop a fever.  Make Sure you: Understand these instructions. Will watch your condition Will get help right away if you are not doing well or get worse.  Your e-visit answers were reviewed by a board certified advanced clinical practitioner to complete your personal care plan.  Depending on the condition, your plan could have included both over the counter or prescription medications.  If there is a problem please reply  once you have received a response from your provider.  Your safety is important to [place].  If you have drug allergies check your prescription carefully.    You can use MyChart to ask questions about today's visit, request a non-urgent call back, or ask for a work or school excuse  for 24 hours related to this e-Visit. If it has been greater than 24 hours you will need to follow up with your provider, or enter a new e-Visit to address those concerns.  You will get an e-mail in the next two days asking about your experience.  I hope that your e-visit has been valuable and will speed your recovery. Thank you for using e-visits.

## 2023-03-18 NOTE — Progress Notes (Signed)
I have spent 5 minutes in review of e-visit questionnaire, review and updating patient chart, medical decision making and response to patient.   Mia Milan Cody Jacklynn Dehaas, PA-C    

## 2023-03-28 ENCOUNTER — Telehealth: Payer: 59 | Admitting: Nurse Practitioner

## 2023-03-28 DIAGNOSIS — J069 Acute upper respiratory infection, unspecified: Secondary | ICD-10-CM

## 2023-03-28 MED ORDER — BENZONATATE 100 MG PO CAPS: 100.0000 mg | ORAL_CAPSULE | Freq: Three times a day (TID) | ORAL | 0 refills | Status: DC | PRN

## 2023-03-28 NOTE — Progress Notes (Signed)

## 2023-03-30 NOTE — Progress Notes (Unsigned)
   SUBJECTIVE:  Chief Complaint: Obesity  Interim History: Patient last seen by [facility]- Dr. Sharee Holster 10/09/22.  Haniah is here to discuss her progress with her obesity treatment plan. She is on the {HWW Weight Loss Plan:210964005} and states she {CHL AMB IS/IS NOT:210130109} following her eating plan approximately *** % of the time. She states she {CHL AMB IS/IS NOT:210130109} exercising *** minutes *** times per week.   OBJECTIVE: Visit Diagnoses: Problem List Items Addressed This Visit     Vitamin D deficiency   Insulin resistance - Primary   Hypothyroidism   Obesity (HCC)- Start BMI 41.16    No data recorded No data recorded No data recorded No data recorded   ASSESSMENT AND PLAN:  Diet: Leida {CHL AMB IS/IS NOT:210130109} currently in the action stage of change. As such, her goal is to {HWW Weight Loss Efforts:210964006}. She {HAS HAS ONG:29528} agreed to {HWW Weight Loss Plan:210964005}.  Exercise: Bich has been instructed {HWW Exercise:210964007} for weight loss and overall health benefits.   Behavior Modification:  We discussed the following Behavioral Modification Strategies today: {HWW Behavior Modification:210964008}. We discussed various medication options to help Katrine with her weight loss efforts and we both agreed to ***.  No follow-ups on file.Marland Kitchen She was informed of the importance of frequent follow up visits to maximize her success with intensive lifestyle modifications for her multiple health conditions.  Attestation Statements:   Reviewed by clinician on day of visit: allergies, medications, problem list, medical history, surgical history, family history, social history, and previous encounter notes.   Time spent on visit including pre-visit chart review and post-visit care and charting was *** minutes.    Fiore Detjen, PA-C

## 2023-03-31 ENCOUNTER — Ambulatory Visit (INDEPENDENT_AMBULATORY_CARE_PROVIDER_SITE_OTHER): Payer: 59 | Admitting: Physician Assistant

## 2023-03-31 DIAGNOSIS — E559 Vitamin D deficiency, unspecified: Secondary | ICD-10-CM

## 2023-03-31 DIAGNOSIS — E88819 Insulin resistance, unspecified: Secondary | ICD-10-CM

## 2023-03-31 DIAGNOSIS — E039 Hypothyroidism, unspecified: Secondary | ICD-10-CM

## 2023-04-09 ENCOUNTER — Encounter (INDEPENDENT_AMBULATORY_CARE_PROVIDER_SITE_OTHER): Payer: Self-pay | Admitting: Family Medicine

## 2023-04-09 ENCOUNTER — Ambulatory Visit (INDEPENDENT_AMBULATORY_CARE_PROVIDER_SITE_OTHER): Payer: 59 | Admitting: Family Medicine

## 2023-04-09 VITALS — BP 107/74 | HR 90 | Temp 98.0°F | Ht 73.0 in | Wt 278.0 lb

## 2023-04-09 DIAGNOSIS — E669 Obesity, unspecified: Secondary | ICD-10-CM

## 2023-04-09 DIAGNOSIS — E88819 Insulin resistance, unspecified: Secondary | ICD-10-CM

## 2023-04-09 DIAGNOSIS — F39 Unspecified mood [affective] disorder: Secondary | ICD-10-CM

## 2023-04-09 DIAGNOSIS — E559 Vitamin D deficiency, unspecified: Secondary | ICD-10-CM | POA: Diagnosis not present

## 2023-04-09 DIAGNOSIS — Z6836 Body mass index (BMI) 36.0-36.9, adult: Secondary | ICD-10-CM

## 2023-04-09 MED ORDER — VITAMIN D (ERGOCALCIFEROL) 1.25 MG (50000 UNIT) PO CAPS: 50000.0000 [IU] | ORAL_CAPSULE | ORAL | 0 refills | Status: DC

## 2023-04-09 MED ORDER — SERTRALINE HCL 100 MG PO TABS
100.0000 mg | ORAL_TABLET | Freq: Every day | ORAL | 0 refills | Status: DC
Start: 2023-04-09 — End: 2023-05-27

## 2023-04-09 MED ORDER — METFORMIN HCL 500 MG PO TABS
ORAL_TABLET | ORAL | 0 refills | Status: DC
Start: 2023-04-09 — End: 2023-11-25

## 2023-04-09 NOTE — Progress Notes (Signed)
 Sherry Schmidt, D.O.  ABFM, ABOM Specializing in Clinical Bariatric Medicine  Office located at: [address] [place], [place]  [address]   Assessment and Plan:   FOR THE DISEASE OF OBESITY: Obesity, starting BMI 41.16 BMI 36.0-36.9,adult- current 36.69 Assessment & Plan: Since last office visit on 10/21/22 patient's muscle mass has increased by 8.2lb. Fat mass has increased by 16.6lb. Total body water has decreased by 2.6lb.  Counseling done on how various foods will affect these numbers and how to maximize success  Total lbs lost to date: 34 lbs Total weight loss percentage to date: -10.90%    Recommended Dietary Goals [name] is currently in the action stage of change. As such, her goal is to continue weight management plan.  She has agreed to: continue current plan   Behavioral Intervention We discussed the following today: increasing lean protein intake to established goals, decreasing simple carbohydrates , increasing water intake , work on tracking and journaling calories using tracking application, keeping healthy foods at home, work on managing stress, creating time for self-care and relaxation, and continue to work on maintaining a reduced calorie state, getting the recommended amount of protein, incorporating whole foods, making healthy choices, staying well hydrated and practicing mindfulness when eating.  Additional resources provided today: None Mental health resources.   Evidence-based interventions for health behavior change were utilized today including the discussion of self monitoring techniques, problem-solving barriers and SMART goal setting techniques.   Regarding patient's less desirable eating habits and patterns, we employed the technique of small changes.   Pt will specifically work on: Prioritize her mental health for next visit.    Recommended Physical Activity Goals [name] has been advised to work up to 150 minutes of moderate intensity  aerobic activity a week and strengthening exercises 2-3 times per week for cardiovascular health, weight loss maintenance and preservation of muscle mass.   She has agreed to :  Increase physical activity in their day and reduce sedentary time (increase NEAT). and Increase the intensity, frequency or duration of aerobic exercises     Pharmacotherapy We discussed various medication options to help [name] with her weight loss efforts and we both agreed to : continue with nutritional and behavioral strategies and continue current anti-obesity medication regimen   FOR ASSOCIATED CONDITIONS ADDRESSED TODAY:  Mood disorder [facility]) Assessment & Plan: Pt was started on Zoloft  on 11/10/2019 when starting the program 3-4 years ago due to not being established with a PCP at the time. She has still not established with a PCP and reports doing virtual visits through work [facility]). Has struggled to find a therapist online. Moods are currently uncontrolled, has struggled with this her whole life. Has only ever been on Zoloft . Not taking currently due to needing refill. Reports stress tends to throw her off and gets her off track. She tries to manage her stress by going to the gym and reading.   Plan to prioritize getting her moods well controlled. I recommend she get established with a PCP and a mental health provider to help her better control her moods. Reviewed where to find these providers on the [facility] website. Counseling on how methods such as meditation, deep breathing, prayer, improving sleep quality, and increasing exercise can contribute to improved moods and overall mental health.   Orders: - Refill Zoloft  today, no dose changes.    Insulin  resistance Assessment & Plan: Lab Results  Component Value Date   HGBA1C 5.4 07/30/2022  HGBA1C 5.3 12/14/2020   HGBA1C 5.4 03/06/2020   INSULIN  15.8 12/14/2020   INSULIN  80.9 (H) 03/06/2020   INSULIN  29.3 (H) 11/10/2019   Last  A1C of 5.4 as of 07/30/2022 was at goal. Pt taking Metformin  500 BID. Tolerating well with no side effects. Has been helping with hunger/cravings sometimes, and some days does not feel it's effectiveness. Is exercising for 60 minutes 5 days and doing core exercises daily.   Reviewed the importance of following up in shorter time frames to help with accountability and counseling for weight loss journey. Advised pt to work on getting her moods controlled and start following her meal plan. I recommend she add walking daily to her exercise regimen. Continue with Metformin , no changes today. We will continue to monitor her condition as it relates to her weight loss journey.   Orders: - Refill Metformin , no dose changes made.    Vitamin D  deficiency Assessment & Plan: Lab Results  Component Value Date   VD25OH 36.7 07/30/2022   VD25OH 40.3 12/14/2020   VD25OH 17.5 (L) 03/06/2020   Last vitamin D  was below goal at 36.7 as of 07/30/2022. Not taking weekly dose of vitamin D . Was previously on daily dose of vitamin D  supplementation. Pt would prefer to be on once weekly dose.   Ideal vitamin D  level of 50+ reviewed with pt. Discussed how sub-optimal levels of vitamin D  can affect moods and energy. I reviewed benefits of vitamin D  supplementation. Pt to start ERGO today. We will continue to monitor her condition as it pertains to her weight loss journey.   Orders: - Start ERGO 50K units once weekly.   Follow up:   Return in about 3 months (around 07/08/2023). She was informed of the importance of frequent follow up visits to maximize her success with intensive lifestyle modifications for her multiple health conditions.  Subjective:   Chief complaint: Obesity Schmidt is here to discuss her progress with her obesity treatment plan. She is on the keeping a food journal and adhering to recommended goals of 2900 calories and 150+ protein and states she is following her eating plan approximately 0% of the  time. She states she is exercising at the gym 60 minutes 5 days per week.  Interval History:  Sherry Schmidt is here for a follow up office visit. Since last OV on 10/21/22, she is up 25 lbs. She has not been eating on plan for about 1-2 months.   Barriers identified: moderate to high levels of stress.   Pharmacotherapy for weight loss: She is currently taking Metformin  (off label use for incretin effect and / or insulin  resistance and / or diabetes prevention) with adequate clinical response  and without side effects..   Review of Systems:  Pertinent positives were addressed with patient today.  Reviewed by clinician on day of visit: allergies, medications, problem list, medical history, surgical history, family history, social history, and previous encounter notes.  Weight Summary and Biometrics   Weight Lost Since Last Visit: 0lb  Weight Gained Since Last Visit: 25lb   Vitals Temp: 98 F (36.7 C) BP: 107/74 Pulse Rate: 90 SpO2: 98 %   Anthropometric Measurements Height: 6' 1 (1.854 m) Weight: 278 lb (126.1 kg) BMI (Calculated): 36.69 Weight at Last Visit: 253 Weight Lost Since Last Visit: 0lb Weight Gained Since Last Visit: 25lb Starting Weight: 312lb Total Weight Loss (lbs): 34 lb (15.4 kg) Peak Weight: 321   Body Composition  Body Fat %: 43.7 % Fat Mass (lbs):  121.6 lbs Muscle Mass (lbs): 148.8 lbs Total Body Water (lbs): 101.8 lbs Visceral Fat Rating : 9   Other Clinical Data Fasting: no Labs: no Today's Visit #: 23 Starting Date: 11/10/19    Objective:   PHYSICAL EXAM: Blood pressure 107/74, pulse 90, temperature 98 F (36.7 C), height 6' 1 (1.854 m), weight 278 lb (126.1 kg), SpO2 98%. Body mass index is 36.68 kg/m.  General: she is overweight, cooperative and in no acute distress. PSYCH: Has normal mood, affect and thought process.   HEENT: EOMI, sclerae are anicteric. Lungs: Normal breathing effort, no conversational  dyspnea. Extremities: Moves * 4 Neurologic: A and O * 3, good insight  DIAGNOSTIC DATA REVIEWED: BMET    Component Value Date/Time   NA 138 10/15/2022 1727   NA 140 07/30/2022 1533   K 4.2 10/15/2022 1727   CL 106 10/15/2022 1727   CO2 21 (L) 10/15/2022 1727   GLUCOSE 88 10/15/2022 1727   BUN 17 10/15/2022 1727   BUN 10 07/30/2022 1533   CREATININE 0.78 10/15/2022 1727   CALCIUM  8.5 (L) 10/15/2022 1727   GFRNONAA >60 10/15/2022 1727   GFRAA 141 03/06/2020 1210   Lab Results  Component Value Date   HGBA1C 5.4 07/30/2022   HGBA1C 5.6 11/10/2019   Lab Results  Component Value Date   INSULIN  15.8 12/14/2020   INSULIN  29.3 (H) 11/10/2019   Lab Results  Component Value Date   TSH 5.291 (H) 10/15/2022   CBC    Component Value Date/Time   WBC 12.2 (H) 10/15/2022 1727   RBC 4.73 10/15/2022 1727   HGB 14.7 10/15/2022 1727   HGB 15.5 11/10/2019 1426   HCT 43.0 10/15/2022 1727   HCT 47.8 (H) 11/10/2019 1426   PLT 310 10/15/2022 1727   PLT 300 11/10/2019 1426   MCV 90.9 10/15/2022 1727   MCV 90 11/10/2019 1426   MCH 31.1 10/15/2022 1727   MCHC 34.2 10/15/2022 1727   RDW 14.0 10/15/2022 1727   RDW 13.6 11/10/2019 1426   Iron Studies No results found for: IRON, TIBC, FERRITIN, IRONPCTSAT Lipid Panel     Component Value Date/Time   CHOL 187 12/14/2020 1351   TRIG 135 12/14/2020 1351   HDL 46 12/14/2020 1351   LDLCALC 117 (H) 12/14/2020 1351   Hepatic Function Panel     Component Value Date/Time   PROT 6.6 07/30/2022 1533   ALBUMIN 4.5 07/30/2022 1533   AST 16 07/30/2022 1533   ALT 16 07/30/2022 1533   ALKPHOS 80 07/30/2022 1533   BILITOT 0.4 07/30/2022 1533      Component Value Date/Time   TSH 5.291 (H) 10/15/2022 1800   TSH 3.870 07/30/2022 1533   Nutritional Lab Results  Component Value Date   VD25OH 36.7 07/30/2022   VD25OH 40.3 12/14/2020   VD25OH 17.5 (L) 03/06/2020    Attestations:   LILLETTE Vernell Forest , acting as a medical scribe  for Sherry Jenkins, DO., have compiled all relevant documentation for today's office visit on behalf of Sherry Jenkins, DO, while in the presence of [employer], DO.  Reviewed by clinician on day of visit: allergies, medications, problem list, medical history, surgical history, family history, social history, and previous encounter notes pertinent to patient's obesity diagnosis.  I have spent 40 minutes in the care of the patient today including: preparing to see patient (e.g. review and interpretation of tests, old notes ), obtaining and/or reviewing separately obtained history, performing a medically appropriate examination or evaluation, counseling  and educating the patient, ordering medications, test or procedures, documenting clinical information in the electronic or other health care record, and independently interpreting results and communicating results to the patient, family, or caregiver   I have reviewed the above documentation for accuracy and completeness, and I agree with the above. Sherry Schmidt, D.O.  The 21st Century Cures Act was signed into law in 2016 which includes the topic of electronic health records.  This provides immediate access to information in MyChart.  This includes consultation notes, operative notes, office notes, lab results and pathology reports.  If you have any questions about what you read please let us  know at your next visit so we can discuss your concerns and take corrective action if need be.  We are right here with you.

## 2023-04-24 ENCOUNTER — Telehealth: Payer: Medicaid Other | Admitting: Physician Assistant

## 2023-04-24 DIAGNOSIS — H66009 Acute suppurative otitis media without spontaneous rupture of ear drum, unspecified ear: Secondary | ICD-10-CM

## 2023-04-24 MED ORDER — AMOXICILLIN 875 MG PO TABS: 875.0000 mg | ORAL_TABLET | Freq: Two times a day (BID) | ORAL | 0 refills | Status: AC

## 2023-04-24 NOTE — Progress Notes (Signed)
I have spent 5 minutes in review of e-visit questionnaire, review and updating patient chart, medical decision making and response to patient.   Mia Milan Cody Jacklynn Dehaas, PA-C    

## 2023-04-24 NOTE — Progress Notes (Signed)
E-Visit for Ear Pain - Acute Otitis Media   We are sorry that you are not feeling well. Here is how we plan to help!  Based on what you have shared with me it looks like you have Acute Otitis Media.  Acute Otitis Media is an infection of the middle or "inner" ear. This type of infection can cause redness, inflammation, and fluid buildup behind the tympanic membrane (ear drum).  The usual symptoms include: Earache/Pain Fever Upper respiratory symptoms Lack of energy/Fatigue/Malaise Slight hearing loss gradually worsening- if the inner ear fills with fluid What causes middle ear infections? Most middle ear infections occur when an infection such as a cold, leads to a build-up of mucus in the middle ear and causes the Eustachian tube (a thin tube that runs from the middle ear to the back of the nose) to become swollen or blocked.   This means mucus can't drain away properly, making it easier for an infection to spread into the middle ear.  How middle ear infections are treated: Most ear infections clear up within three to five days and don't need any specific treatment. If necessary, tylenol or ibuprofen should be used to relieve pain and a high temperature.  If you develop a fever higher than 102, or any significantly worsening symptoms, this could indicate a more serious infection moving to the middle/inner and needs face to face evaluation in an office by a provider.   Antibiotics aren't routinely used to treat middle ear infections, although they may occasionally be prescribed if symptoms persist or are particularly severe. Given your presentation,   I have prescribed Amoxicillin 875 mg one tablet twice daily for 10 days   Your symptoms should improve over the next 3 days and should resolve in about 7 days. Be sure to complete ALL of the prescription(s) given.  HOME CARE: Wash your hands frequently. If you are prescribed an ear drop, do not place the tip of the bottle on your ear or  touch it with your fingers. You can take Acetaminophen 650 mg every 4-6 hours as needed for pain.  If pain is severe or moderate, you can apply a heating pad (set on low) or hot water bottle (wrapped in a towel) to outer ear for 20 minutes.  This will also increase drainage.  GET HELP RIGHT AWAY IF: Fever is over 102.2 degrees. You develop progressive ear pain or hearing loss. Ear symptoms persist longer than 3 days after treatment.  MAKE SURE YOU: Understand these instructions. Will watch your condition. Will get help right away if you are not doing well or get worse.  Thank you for choosing an e-visit.  Your e-visit answers were reviewed by a board certified advanced clinical practitioner to complete your personal care plan. Depending upon the condition, your plan could have included both over the counter or prescription medications.  Please review your pharmacy choice. Make sure the pharmacy is open so you can pick up the prescription now. If there is a problem, you may contact your provider through MyChart messaging and have the prescription routed to another pharmacy.  Your safety is important to us. If you have drug allergies check your prescription carefully.   For the next 24 hours you can use MyChart to ask questions about today's visit, request a non-urgent call back, or ask for a work or school excuse. You will get an email with a survey after your eVisit asking about your experience. We would appreciate your feedback. I hope   that your e-visit has been valuable and will aid in your recovery.   

## 2023-05-06 ENCOUNTER — Telehealth: Payer: Medicaid Other | Admitting: Physician Assistant

## 2023-05-06 DIAGNOSIS — A084 Viral intestinal infection, unspecified: Secondary | ICD-10-CM

## 2023-05-06 MED ORDER — ONDANSETRON 4 MG PO TBDP
4.0000 mg | ORAL_TABLET | Freq: Three times a day (TID) | ORAL | 0 refills | Status: DC | PRN
Start: 2023-05-06 — End: 2023-08-27

## 2023-05-06 NOTE — Progress Notes (Signed)
E-Visit for Diarrhea  We are sorry that you are not feeling well.  Here is how we plan to help!  Based on what you have shared with me it looks like you have Acute Infectious Diarrhea.  Most cases of acute diarrhea are due to infections with virus and bacteria and are self-limited conditions lasting less than 14 days.  For your symptoms you may take Imodium 2 mg tablets that are over the counter at your local pharmacy. Take two tablet now and then one after each loose stool up to 6 a day.  Antibiotics are not needed for most people with diarrhea.  I have prescribed Zofran 4 mg 1 tablet every 8 hours as needed for nausea and vomiting  I have sent a work note to Pharmacologist. You can find by going to the Menu on your homepage, scrolling down to the Communications section, and selecting Letters. Let us know if you have any issue locating. Take care and feel better soon!   HOME CARE We recommend changing your diet to help with your symptoms for the next few days. Drink plenty of fluids that contain water salt and sugar. Sports drinks such as Gatorade may help.  You may try broths, soups, bananas, applesauce, soft breads, mashed potatoes or crackers.  You are considered infectious for as long as the diarrhea continues. Hand washing or use of alcohol based hand sanitizers is recommend. It is best to stay out of work or school until your symptoms stop.   GET HELP RIGHT AWAY If you have dark yellow colored urine or do not pass urine frequently you should drink more fluids.   If your symptoms worsen  If you feel like you are going to pass out (faint) You have a new problem  MAKE SURE YOU  Understand these instructions. Will watch your condition. Will get help right away if you are not doing well or get worse.  Thank you for choosing an e-visit.  Your e-visit answers were reviewed by a board certified advanced clinical practitioner to complete your personal care plan. Depending upon the  condition, your plan could have included both over the counter or prescription medications.  Please review your pharmacy choice. Make sure the pharmacy is open so you can pick up prescription now. If there is a problem, you may contact your provider through [employer] and have the prescription routed to another pharmacy.  Your safety is important to [place]. If you have drug allergies check your prescription carefully.   For the next 24 hours you can use MyChart to ask questions about today's visit, request a non-urgent call back, or ask for a work or school excuse. You will get an email in the next two days asking about your experience. I hope that your e-visit has been valuable and will speed your recovery.

## 2023-05-06 NOTE — Progress Notes (Signed)
 I have spent 5 minutes in review of e-visit questionnaire, review and updating patient chart, medical decision making and response to patient.   [name], PA-C

## 2023-05-13 ENCOUNTER — Ambulatory Visit: Payer: Medicaid Other | Admitting: Family Medicine

## 2023-05-15 ENCOUNTER — Other Ambulatory Visit (INDEPENDENT_AMBULATORY_CARE_PROVIDER_SITE_OTHER): Payer: Self-pay | Admitting: Family Medicine

## 2023-05-15 DIAGNOSIS — E038 Other specified hypothyroidism: Secondary | ICD-10-CM

## 2023-05-15 MED ORDER — LEVOTHYROXINE SODIUM 100 MCG PO TABS: 100.0000 ug | ORAL_TABLET | Freq: Every day | ORAL | 0 refills | Status: DC

## 2023-05-21 ENCOUNTER — Telehealth: Payer: Medicaid Other | Admitting: Physician Assistant

## 2023-05-21 DIAGNOSIS — J069 Acute upper respiratory infection, unspecified: Secondary | ICD-10-CM

## 2023-05-21 MED ORDER — IPRATROPIUM BROMIDE 0.03 % NA SOLN: 2.0000 | Freq: Two times a day (BID) | NASAL | 0 refills | Status: DC

## 2023-05-21 MED ORDER — PREDNISONE 20 MG PO TABS: 40.0000 mg | ORAL_TABLET | Freq: Every day | ORAL | 0 refills | Status: DC

## 2023-05-21 MED ORDER — BENZONATATE 100 MG PO CAPS: 100.0000 mg | ORAL_CAPSULE | Freq: Three times a day (TID) | ORAL | 0 refills | Status: DC | PRN

## 2023-05-21 NOTE — Progress Notes (Signed)
 I have spent 5 minutes in review of e-visit questionnaire, review and updating patient chart, medical decision making and response to patient.   [name], PA-C

## 2023-05-21 NOTE — Progress Notes (Signed)
 Message sent to patient requesting further input regarding current symptoms. Awaiting patient response.

## 2023-05-21 NOTE — Progress Notes (Signed)

## 2023-05-25 ENCOUNTER — Other Ambulatory Visit: Payer: Self-pay

## 2023-05-25 ENCOUNTER — Emergency Department (HOSPITAL_COMMUNITY)
Admission: EM | Admit: 2023-05-25 | Discharge: 2023-05-25 | Disposition: A | Discharge status: Home / Self Care | Payer: Medicaid Other | Attending: Emergency Medicine | Admitting: Emergency Medicine

## 2023-05-25 ENCOUNTER — Encounter (HOSPITAL_COMMUNITY): Payer: Self-pay | Admitting: *Deleted

## 2023-05-25 DIAGNOSIS — M545 Low back pain, unspecified: Secondary | ICD-10-CM | POA: Insufficient documentation

## 2023-05-25 MED ORDER — KETOROLAC TROMETHAMINE 15 MG/ML IJ SOLN
15.0000 mg | Freq: Once | INTRAMUSCULAR | Status: AC
  Administered 2023-05-25: 15 mg via INTRAMUSCULAR
  Filled 2023-05-25: qty 1

## 2023-05-25 MED ORDER — METAXALONE 800 MG PO TABS: 800.0000 mg | ORAL_TABLET | Freq: Three times a day (TID) | ORAL | 0 refills | Status: DC

## 2023-05-25 MED ORDER — LIDOCAINE 5 % EX PTCH: 2.0000 | MEDICATED_PATCH | CUTANEOUS | 0 refills | Status: DC

## 2023-05-25 MED ORDER — LIDOCAINE 5 % EX PTCH
2.0000 | MEDICATED_PATCH | CUTANEOUS | Status: DC
  Administered 2023-05-25: 2 via TRANSDERMAL
  Filled 2023-05-25: qty 2

## 2023-05-25 MED ORDER — CYCLOBENZAPRINE HCL 10 MG PO TABS
5.0000 mg | ORAL_TABLET | Freq: Once | ORAL | Status: AC
  Administered 2023-05-25: 5 mg via ORAL
  Filled 2023-05-25: qty 1

## 2023-05-25 NOTE — Discharge Instructions (Addendum)
 Today you were seen for low back pain.  Please pick up your medications and take as prescribed.  You may also take Tylenol and Motrin as needed for pain.  Please do not take Motrin or any other NSAIDs today as you have received a Toradol injection.  Please follow-up with orthopedics if your symptoms persist.  Thank you for letting us treat you today. After performing a physical exam I feel you are safe to go home. Please follow up with your PCP in the next several days and provide them with your records from this visit. Return to the Emergency Room if pain becomes severe or symptoms worsen.

## 2023-05-25 NOTE — ED Triage Notes (Signed)
 C/o lower back pain onset several years ago however states it comes and goes, states her back started hurting on Friday , denies injury, c/o tingling in her right posterior thigh and the tingling seems to be going up her spine and sometimes she gets dizzy.

## 2023-05-25 NOTE — ED Provider Notes (Signed)
 [place] EMERGENCY DEPARTMENT AT [facility] Provider Note   CSN: [id number] Arrival date & time: 05/25/23  8119     History  Chief Complaint  Patient presents with   Back Pain    Sherry Schmidt is a 28 y.o. female with medical history significant for back pain presents today for back pain.  Patient states that she has had this back pain for years intermittently.  Pain started again on Friday.  Patient denies any injury.  Patient complains of tingling in her right posterior thigh and the tingling seems to be going up her spine.  She states that this is her usual back pain.  Patient denies numbness, weakness, saddle anesthesia, loss of bowel or bladder, fever, chills, or urinary symptoms.  States that she has not followed up with orthopedics in the past, but would like to for further evaluation.   Back Pain      Home Medications Prior to Admission medications   Medication Sig Start Date End Date Taking? Authorizing Provider  lidocaine (LIDODERM) 5 % Place 2 patches onto the skin daily. Remove & Discard patch within 12 hours or as directed by MD 05/25/23  Yes Dolphus Jenny, PA-C  metaxalone (SKELAXIN) 800 MG tablet Take 1 tablet (800 mg total) by mouth 3 (three) times daily. 05/25/23  Yes Dolphus Jenny, PA-C  albuterol (VENTOLIN HFA) 108 (90 Base) MCG/ACT inhaler Inhale 2 puffs into the lungs every 4 (four) hours as needed for wheezing or shortness of breath. 07/04/22   Zenia Resides, MD  benzonatate (TESSALON) 100 MG capsule Take 1 capsule (100 mg total) by mouth 3 (three) times daily as needed for cough. 05/21/23   Waldon Merl, PA-C  Budesonide 90 MCG/ACT inhaler Inhale 1 puff into the lungs 2 (two) times daily. 11/05/22   Waldon Merl, PA-C  cholecalciferol (VITAMIN D3) 25 MCG (1000 UNIT) tablet Take 1,000 Units by mouth daily. 2 every day    [provider]  cyclobenzaprine (FLEXERIL) 5 MG tablet Take 1 tablet (5 mg total) by mouth 3 (three) times  daily as needed for muscle spasms. 02/04/23   Wallis Bamberg, PA-C  fluconazole (DIFLUCAN) 150 MG tablet Take 1 tablet (150 mg total) by mouth every 3 (three) days as needed. 01/07/23   Margaretann Loveless, PA-C  fluticasone (FLONASE) 50 MCG/ACT nasal spray Place 2 sprays into both nostrils daily. 12/18/22   Waldon Merl, PA-C  hydrOXYzine (ATARAX) 10 MG tablet Take 1 tablet (10 mg total) by mouth 2 (two) times daily as needed for anxiety. 10/15/22   Zenia Resides, MD  ipratropium (ATROVENT) 0.03 % nasal spray Place 2 sprays into both nostrils every 12 (twelve) hours. 05/21/23   Waldon Merl, PA-C  levothyroxine (SYNTHROID) 100 MCG tablet Take 1 tablet (100 mcg total) by mouth daily before breakfast. 05/15/23   Opalski, Gavin Pound, DO  metFORMIN (GLUCOPHAGE) 500 MG tablet 1 with breakfast and 1 with lunch daily 04/09/23   Opalski, Gavin Pound, DO  mupirocin cream (BACTROBAN) 2 % Apply 1 Application topically 2 (two) times daily. 01/14/23   Theadora Rama Scales, PA-C  ondansetron (ZOFRAN-ODT) 4 MG disintegrating tablet Take 1 tablet (4 mg total) by mouth every 8 (eight) hours as needed for nausea or vomiting. 05/06/23   Waldon Merl, PA-C  predniSONE (DELTASONE) 20 MG tablet Take 2 tablets (40 mg total) by mouth daily with breakfast. 05/21/23   Waldon Merl, PA-C  sertraline (ZOLOFT) 100 MG tablet Take 1  tablet (100 mg total) by mouth daily. 04/09/23   Opalski, Gavin Pound, DO  Vitamin D, Ergocalciferol, (DRISDOL) 1.25 MG (50000 UNIT) CAPS capsule Take 1 capsule (50,000 Units total) by mouth every 7 (seven) days. 04/09/23   Thomasene Lot, DO      Allergies    Patient has no known allergies.    Review of Systems   Review of Systems  Musculoskeletal:  Positive for back pain.    Physical Exam Updated Vital Signs BP 130/84 (BP Location: Right Arm)   Pulse 92   Temp 98 F (36.7 C) (Oral)   Resp 18   Ht 6\' 1"  (1.854 m)   Wt 98 kg   SpO2 99%   BMI 28.50 kg/m  Physical Exam Vitals and  nursing note reviewed.  Constitutional:      General: She is not in acute distress.    Appearance: She is well-developed.  HENT:     Head: Normocephalic and atraumatic.     Right Ear: External ear normal.     Left Ear: External ear normal.     Nose: Nose normal.     Mouth/Throat:     Mouth: Mucous membranes are moist.  Eyes:     Extraocular Movements: Extraocular movements intact.     Conjunctiva/sclera: Conjunctivae normal.  Cardiovascular:     Rate and Rhythm: Normal rate and regular rhythm.     Pulses: Normal pulses.     Heart sounds: Normal heart sounds. No murmur heard. Pulmonary:     Effort: Pulmonary effort is normal. No respiratory distress.     Breath sounds: Normal breath sounds.  Abdominal:     Palpations: Abdomen is soft.     Tenderness: There is no abdominal tenderness.  Musculoskeletal:        General: No swelling.     Cervical back: Normal range of motion and neck supple.     Right lower leg: No edema.     Left lower leg: No edema.     Comments: Patient has mild tenderness to palpation to bilateral paraspinal muscles in the lumbar spine region with right greater than left.  Patient has equal bilateral dorsi/plantarflexion of feet against resistance and is able to straight leg lift bilateral legs with mild discomfort to lumbar back.  Patient is neurovascularly intact.  Skin:    General: Skin is warm and dry.     Capillary Refill: Capillary refill takes less than 2 seconds.  Neurological:     General: No focal deficit present.     Mental Status: She is alert.     Motor: No weakness.  Psychiatric:        Mood and Affect: Mood normal.     ED Results / Procedures / Treatments   Labs (all labs ordered are listed, but only abnormal results are displayed) Labs Reviewed - No data to display  EKG None  Radiology No results found.  Procedures Procedures    Medications Ordered in ED Medications  lidocaine (LIDODERM) 5 % 2 patch (2 patches Transdermal  Patch Applied 05/25/23 0813)  ketorolac (TORADOL) 15 MG/ML injection 15 mg (15 mg Intramuscular Given 05/25/23 0810)  cyclobenzaprine (FLEXERIL) tablet 5 mg (5 mg Oral Given 05/25/23 1610)    ED Course/ Medical Decision Making/ A&P                                 Medical Decision Making  This patient presents to  the ED with chief complaint(s) of back pain with pertinent past medical history of back pain which further complicates the presenting complaint. The complaint involves an extensive differential diagnosis and also carries with it a high risk of complications and morbidity.    The differential diagnosis includes musculoskeletal pain, cauda equina syndrome, spinal fracture, epidural abscess  Additional history obtained: Records reviewed Primary Care Documents  ED Course and Reassessment: Patient given Lidoderm patch, Toradol shot, and Flexeril while in ED  Consultation: - Consulted or discussed management/test interpretation w/ external professional: None  Consideration for admission or further workup: Consider for admission or further workup however patients physical exam and vital signs are reassuring.  Patient has no signs or symptoms concerning for cauda equina syndrome, spinal fracture, or epidural abscess at this time.  Given patient has had extensive history of same back pain and no new injury is present, I do not feel imaging is necessary at this time.  Patient given symptomatic management with Lidoderm patches and short course of muscle relaxers as this is helped the patient in the past.  Patient also given ambulatory referral to orthopedics for further evaluation if her symptoms persist.        Final Clinical Impression(s) / ED Diagnoses Final diagnoses:  Low back pain, unspecified back pain laterality, unspecified chronicity, unspecified whether sciatica present    Rx / DC Orders ED Discharge Orders          Ordered    lidocaine (LIDODERM) 5 %  Every 24 hours         05/25/23 0809    metaxalone (SKELAXIN) 800 MG tablet  3 times daily        05/25/23 0809              Dolphus Jenny, PA-C 05/25/23 1610    Tegeler, Canary Brim, MD 05/25/23 [phone]

## 2023-05-27 ENCOUNTER — Other Ambulatory Visit (INDEPENDENT_AMBULATORY_CARE_PROVIDER_SITE_OTHER): Payer: Self-pay | Admitting: Family Medicine

## 2023-05-27 DIAGNOSIS — F39 Unspecified mood [affective] disorder: Secondary | ICD-10-CM

## 2023-05-27 MED ORDER — SERTRALINE HCL 100 MG PO TABS
100.0000 mg | ORAL_TABLET | Freq: Every day | ORAL | 1 refills | Status: DC
Start: 2023-05-27 — End: 2023-11-25

## 2023-06-10 ENCOUNTER — Telehealth: Admitting: Physician Assistant

## 2023-06-10 DIAGNOSIS — B001 Herpesviral vesicular dermatitis: Secondary | ICD-10-CM | POA: Diagnosis not present

## 2023-06-10 DIAGNOSIS — J029 Acute pharyngitis, unspecified: Secondary | ICD-10-CM | POA: Diagnosis not present

## 2023-06-10 MED ORDER — VALACYCLOVIR HCL 1 G PO TABS: 2000.0000 mg | ORAL_TABLET | Freq: Two times a day (BID) | ORAL | 0 refills | Status: AC

## 2023-06-10 NOTE — Progress Notes (Signed)

## 2023-06-10 NOTE — Progress Notes (Signed)
 I have spent 5 minutes in review of e-visit questionnaire, review and updating patient chart, medical decision making and response to patient.   [name], PA-C

## 2023-06-18 ENCOUNTER — Ambulatory Visit: Payer: Medicaid Other | Admitting: Family Medicine

## 2023-07-02 ENCOUNTER — Ambulatory Visit
Admission: EM | Admit: 2023-07-02 | Discharge: 2023-07-02 | Disposition: A | Discharge status: Home / Self Care | Attending: Family Medicine | Admitting: Family Medicine

## 2023-07-02 DIAGNOSIS — R519 Headache, unspecified: Secondary | ICD-10-CM

## 2023-07-02 DIAGNOSIS — F0781 Postconcussional syndrome: Secondary | ICD-10-CM | POA: Diagnosis not present

## 2023-07-02 MED ORDER — ACETAMINOPHEN 325 MG PO TABS: 650.0000 mg | ORAL_TABLET | Freq: Four times a day (QID) | ORAL | 0 refills | Status: DC | PRN

## 2023-07-02 NOTE — ED Triage Notes (Signed)
 Pt states she tripped/fell on 3/31 states she struck on side walk-reports +LOC ~45 sec-c/o cont'd HA and feeling really tired and hazy-taking ibuprofen for HA with no relief-NAD-steady gait

## 2023-07-02 NOTE — ED Provider Notes (Signed)
 [facility] - URGENT CARE CENTER  Note:  This document was prepared using Conservation officer, historic buildings and may include unintentional dictation errors.  MRN: [id number] DOB: 01-10-1996  Subjective:   Sherry Schmidt is a 28 y.o. female presenting for 1.5-day history of persistent mild to moderate headache all over with fatigue.  On Monday night, patient tripped on an uneven paved surface.  She fell quickly and cannot recall how she hit her head exactly.  However, she does note that she lost consciousness for less than a minute.  She has since felt mental fog, the headache as for mentioned.  No double vision, confusion, weakness, numbness or tingling, difficulty with balance, nausea, vomiting.  Has used ibuprofen with minimal relief.  No current facility-administered medications for this encounter.  Current Outpatient Medications:    albuterol (VENTOLIN HFA) 108 (90 Base) MCG/ACT inhaler, Inhale 2 puffs into the lungs every 4 (four) hours as needed for wheezing or shortness of breath., Disp: 1 each, Rfl: 0   benzonatate (TESSALON) 100 MG capsule, Take 1 capsule (100 mg total) by mouth 3 (three) times daily as needed for cough., Disp: 30 capsule, Rfl: 0   Budesonide 90 MCG/ACT inhaler, Inhale 1 puff into the lungs 2 (two) times daily., Disp: 1 each, Rfl: 0   cholecalciferol (VITAMIN D3) 25 MCG (1000 UNIT) tablet, Take 1,000 Units by mouth daily. 2 every day, Disp: , Rfl:    cyclobenzaprine (FLEXERIL) 5 MG tablet, Take 1 tablet (5 mg total) by mouth 3 (three) times daily as needed for muscle spasms., Disp: 30 tablet, Rfl: 0   fluconazole (DIFLUCAN) 150 MG tablet, Take 1 tablet (150 mg total) by mouth every 3 (three) days as needed., Disp: 2 tablet, Rfl: 0   fluticasone (FLONASE) 50 MCG/ACT nasal spray, Place 2 sprays into both nostrils daily., Disp: 16 g, Rfl: 0   hydrOXYzine (ATARAX) 10 MG tablet, Take 1 tablet (10 mg total) by mouth 2 (two) times daily as needed for anxiety., Disp: 10  tablet, Rfl: 0   ipratropium (ATROVENT) 0.03 % nasal spray, Place 2 sprays into both nostrils every 12 (twelve) hours., Disp: 30 mL, Rfl: 0   levothyroxine (SYNTHROID) 100 MCG tablet, Take 1 tablet (100 mcg total) by mouth daily before breakfast., Disp: 90 tablet, Rfl: 0   lidocaine (LIDODERM) 5 %, Place 2 patches onto the skin daily. Remove & Discard patch within 12 hours or as directed by MD, Disp: 60 patch, Rfl: 0   metaxalone (SKELAXIN) 800 MG tablet, Take 1 tablet (800 mg total) by mouth 3 (three) times daily., Disp: 21 tablet, Rfl: 0   metFORMIN (GLUCOPHAGE) 500 MG tablet, 1 with breakfast and 1 with lunch daily, Disp: 180 tablet, Rfl: 0   mupirocin cream (BACTROBAN) 2 %, Apply 1 Application topically 2 (two) times daily., Disp: 30 g, Rfl: 0   ondansetron (ZOFRAN-ODT) 4 MG disintegrating tablet, Take 1 tablet (4 mg total) by mouth every 8 (eight) hours as needed for nausea or vomiting., Disp: 20 tablet, Rfl: 0   predniSONE (DELTASONE) 20 MG tablet, Take 2 tablets (40 mg total) by mouth daily with breakfast., Disp: 10 tablet, Rfl: 0   sertraline (ZOLOFT) 100 MG tablet, Take 1 tablet (100 mg total) by mouth daily., Disp: 30 tablet, Rfl: 1   Vitamin D, Ergocalciferol, (DRISDOL) 1.25 MG (50000 UNIT) CAPS capsule, Take 1 capsule (50,000 Units total) by mouth every 7 (seven) days., Disp: 12 capsule, Rfl: 0   No Known Allergies  Past Medical History:  Diagnosis Date   Asthma    Back pain    Back pain    Depression    Panic attacks    Thyroid disease      Past Surgical History:  Procedure Laterality Date   THYROIDECTOMY      Family History  Problem Relation Age of Onset   Hypertension Mother    Depression Mother    Bipolar disorder Mother    Drug abuse Mother    Obesity Mother    Diabetes Father    Thyroid disease Father    Cancer Father    Depression Father    Anxiety disorder Father    Bipolar disorder Father    Schizophrenia Father    Liver disease Father    Sleep apnea  Father    Alcohol abuse Father    Drug abuse Father    Obesity Father     Social History   Tobacco Use   Smoking status: Never   Smokeless tobacco: Never  Vaping Use   Vaping status: Never Used  Substance Use Topics   Alcohol use: Yes    Comment: occasional   Drug use: No    ROS   Objective:   Vitals: BP 138/86 (BP Location: Left Arm)   Pulse (!) 58   Temp 98.2 F (36.8 C) (Oral)   Resp 20   LMP 06/07/2023   SpO2 98%   Physical Exam Constitutional:      General: She is not in acute distress.    Appearance: Normal appearance. She is well-developed and normal weight. She is not ill-appearing, toxic-appearing or diaphoretic.  HENT:     Head: Normocephalic and atraumatic.     Comments: Scalp is atraumatic.    Right Ear: Tympanic membrane, ear canal and external ear normal. No drainage or tenderness. No middle ear effusion. There is no impacted cerumen. Tympanic membrane is not erythematous or bulging.     Left Ear: Tympanic membrane, ear canal and external ear normal. No drainage or tenderness.  No middle ear effusion. There is no impacted cerumen. Tympanic membrane is not erythematous or bulging.     Nose: Nose normal. No congestion or rhinorrhea.     Mouth/Throat:     Mouth: Mucous membranes are moist. No oral lesions.     Pharynx: No pharyngeal swelling, oropharyngeal exudate, posterior oropharyngeal erythema or uvula swelling.     Tonsils: No tonsillar exudate or tonsillar abscesses.  Eyes:     General: No scleral icterus.       Right eye: No discharge.        Left eye: No discharge.     Extraocular Movements: Extraocular movements intact.     Right eye: Normal extraocular motion.     Left eye: Normal extraocular motion.     Conjunctiva/sclera: Conjunctivae normal.  Neck:     Meningeal: Brudzinski's sign and Kernig's sign absent.  Cardiovascular:     Rate and Rhythm: Normal rate.  Pulmonary:     Effort: Pulmonary effort is normal.  Musculoskeletal:      Cervical back: Normal range of motion and neck supple.  Lymphadenopathy:     Cervical: No cervical adenopathy.  Skin:    General: Skin is warm and dry.  Neurological:     General: No focal deficit present.     Mental Status: She is alert and oriented to person, place, and time.     Cranial Nerves: No cranial nerve deficit, dysarthria or facial asymmetry.     Motor:  No weakness or pronator drift.     Coordination: Romberg sign negative. Coordination normal. Finger-Nose-Finger Test and Heel to [facility] Test normal. Rapid alternating movements normal.     Gait: Gait and tandem walk normal.     Deep Tendon Reflexes: Reflexes normal.  Psychiatric:        Mood and Affect: Mood normal.        Behavior: Behavior normal.        Thought Content: Thought content normal.        Judgment: Judgment normal.     Assessment and Plan :   PDMP not reviewed this encounter.  1. Post concussion syndrome    Recommended conservative management, watchful monitoring.  Maintain strict ER precautions for intracranial injury.  Counseled patient on potential for adverse effects with medications prescribed/recommended today, ER and return-to-clinic precautions discussed, patient verbalized understanding.    Wallis Bamberg, [place] 07/02/23 [phone]

## 2023-07-08 ENCOUNTER — Ambulatory Visit (INDEPENDENT_AMBULATORY_CARE_PROVIDER_SITE_OTHER): Payer: 59 | Admitting: Physician Assistant

## 2023-07-16 ENCOUNTER — Encounter (INDEPENDENT_AMBULATORY_CARE_PROVIDER_SITE_OTHER): Payer: Self-pay

## 2023-08-10 NOTE — Progress Notes (Deleted)
   SUBJECTIVE: Discussed the use of AI scribe software for clinical note transcription with the patient, who gave verbal consent to proceed.  Chief Complaint: Obesity  Interim History: ***  Sherry Schmidt is here to discuss her progress with her obesity treatment plan. She is on the {HWW Weight Loss Plan:210964005} and states she {CHL AMB IS/IS NOT:210130109} following her eating plan approximately *** % of the time. She states she {CHL AMB IS/IS NOT:210130109} exercising *** minutes *** times per week.   OBJECTIVE: Visit Diagnoses: Problem List Items Addressed This Visit     Vitamin D  deficiency - Primary   Insulin  resistance   Other Visit Diagnoses       Mood disorder (HCC)         Obesity, starting BMI 41.16           No data recorded No data recorded No data recorded No data recorded   ASSESSMENT AND PLAN:  Diet: Sherry Schmidt {CHL AMB IS/IS NOT:210130109} currently in the action stage of change. As such, her goal is to {HWW Weight Loss Efforts:210964006}. She {HAS HAS BJY:78295} agreed to {HWW Weight Loss Plan:210964005}.  Exercise: Sherry Schmidt has been instructed {HWW Exercise:210964007} for weight loss and overall health benefits.   Behavior Modification:  We discussed the following Behavioral Modification Strategies today: {HWW Behavior Modification:210964008}. We discussed various medication options to help Sherry Schmidt with her weight loss efforts and we both agreed to ***.  No follow-ups on file.Aaron Aas She was informed of the importance of frequent follow up visits to maximize her success with intensive lifestyle modifications for her multiple health conditions.  Attestation Statements:   Reviewed by clinician on day of visit: allergies, medications, problem list, medical history, surgical history, family history, social history, and previous encounter notes.   Time spent on visit including pre-visit chart review and post-visit care and charting was *** minutes.    Alexx Mcburney, PA-C

## 2023-08-11 ENCOUNTER — Encounter (INDEPENDENT_AMBULATORY_CARE_PROVIDER_SITE_OTHER): Payer: Self-pay

## 2023-08-11 ENCOUNTER — Ambulatory Visit (INDEPENDENT_AMBULATORY_CARE_PROVIDER_SITE_OTHER): Admitting: Physician Assistant

## 2023-08-11 DIAGNOSIS — E88819 Insulin resistance, unspecified: Secondary | ICD-10-CM

## 2023-08-11 DIAGNOSIS — F39 Unspecified mood [affective] disorder: Secondary | ICD-10-CM

## 2023-08-11 DIAGNOSIS — E66813 Obesity, class 3: Secondary | ICD-10-CM

## 2023-08-11 DIAGNOSIS — E559 Vitamin D deficiency, unspecified: Secondary | ICD-10-CM

## 2023-08-20 ENCOUNTER — Telehealth: Admitting: Nurse Practitioner

## 2023-08-20 DIAGNOSIS — H109 Unspecified conjunctivitis: Secondary | ICD-10-CM | POA: Diagnosis not present

## 2023-08-20 DIAGNOSIS — H5710 Ocular pain, unspecified eye: Secondary | ICD-10-CM

## 2023-08-20 MED ORDER — OFLOXACIN 0.3 % OP SOLN: 1.0000 [drp] | Freq: Four times a day (QID) | OPHTHALMIC | 0 refills | Status: AC

## 2023-08-20 NOTE — Addendum Note (Signed)
 Addended by: Angelia Kelp on: 08/20/2023 08:02 PM   Modules accepted: Level of Service

## 2023-08-20 NOTE — Progress Notes (Signed)

## 2023-08-20 NOTE — Progress Notes (Signed)
  Because you are having severe eye pain, I feel your condition warrants further evaluation and I recommend that you be seen in a face-to-face visit.   NOTE: There will be NO CHARGE for this E-Visit   If you are having a true medical emergency, please call 911.     For an urgent face to face visit, [place] has multiple urgent care centers for your convenience.  Click the link below for the full list of locations and hours, walk-in wait times, appointment scheduling options and driving directions:  Urgent Care - [place], [place], [place], [place], [place], [place]  [place]     Your MyChart E-visit questionnaire answers were reviewed by a board certified advanced clinical practitioner to complete your personal care plan based on your specific symptoms.    Thank you for using e-Visits.

## 2023-08-27 ENCOUNTER — Telehealth: Admitting: Physician Assistant

## 2023-08-27 DIAGNOSIS — J029 Acute pharyngitis, unspecified: Secondary | ICD-10-CM

## 2023-08-27 MED ORDER — PREDNISONE 10 MG (21) PO TBPK: ORAL_TABLET | ORAL | 0 refills | Status: DC

## 2023-08-27 NOTE — Progress Notes (Signed)
 E-Visit for Sore Throat  We are sorry that you are not feeling well.  Here is how we plan to help!  Your symptoms indicate a likely viral infection (Pharyngitis).   Pharyngitis is inflammation in the back of the throat which can cause a sore throat, scratchiness and sometimes difficulty swallowing.   Pharyngitis is typically caused by a respiratory virus and will just run its course.  Please keep in mind that your symptoms could last up to 10 days.  For throat pain, we recommend over the counter oral pain relief medications such as acetaminophen  or aspirin, or anti-inflammatory medications such as ibuprofen  or naproxen  sodium.  Topical treatments such as oral throat lozenges or sprays may be used as needed.  Avoid close contact with loved ones, especially the very young and elderly.  Remember to wash your hands thoroughly throughout the day as this is the number one way to prevent the spread of infection and wipe down door knobs and counters with disinfectant.  I have added on a short course of prednisone  to reduce inflammation/swelling in the throat and alleviate pain.   After careful review of your answers, I would not recommend an antibiotic for your condition.  Antibiotics should not be used to treat conditions that we suspect are caused by viruses like the virus that causes the common cold or flu. However, some people can have Strep with atypical symptoms. You may need formal testing in clinic or office to confirm if your symptoms continue or worsen.  Providers prescribe antibiotics to treat infections caused by bacteria. Antibiotics are very powerful in treating bacterial infections when they are used properly.  To maintain their effectiveness, they should be used only when necessary.  Overuse of antibiotics has resulted in the development of super bugs that are resistant to treatment!    Home Care: Only take medications as instructed by your medical team. Do not drink alcohol while taking  these medications. A steam or ultrasonic humidifier can help congestion.  You can place a towel over your head and breathe in the steam from hot water coming from a faucet. Avoid close contacts especially the very young and the elderly. Cover your mouth when you cough or sneeze. Always remember to wash your hands.  Get Help Right Away If: You develop worsening fever or throat pain. You develop a severe head ache or visual changes. Your symptoms persist after you have completed your treatment plan.  Make sure you Understand these instructions. Will watch your condition. Will get help right away if you are not doing well or get worse.   Thank you for choosing an e-visit.  Your e-visit answers were reviewed by a board certified advanced clinical practitioner to complete your personal care plan. Depending upon the condition, your plan could have included both over the counter or prescription medications.  Please review your pharmacy choice. Make sure the pharmacy is open so you can pick up prescription now. If there is a problem, you may contact your provider through [employer] and have the prescription routed to another pharmacy.  Your safety is important to us . If you have drug allergies check your prescription carefully.   For the next 24 hours you can use MyChart to ask questions about today's visit, request a non-urgent call back, or ask for a work or school excuse. You will get an email in the next two days asking about your experience. I hope that your e-visit has been valuable and will speed your recovery.

## 2023-08-27 NOTE — Progress Notes (Signed)
 I have spent 5 minutes in review of e-visit questionnaire, review and updating patient chart, medical decision making and response to patient.   [name], PA-C

## 2023-08-28 ENCOUNTER — Telehealth: Admitting: Physician Assistant

## 2023-08-28 DIAGNOSIS — H6991 Unspecified Eustachian tube disorder, right ear: Secondary | ICD-10-CM

## 2023-08-28 MED ORDER — FLUTICASONE PROPIONATE 50 MCG/ACT NA SUSP
2.0000 | Freq: Every day | NASAL | 0 refills | Status: DC
Start: 2023-08-28 — End: 2024-02-10

## 2023-08-28 NOTE — Progress Notes (Signed)

## 2023-09-03 ENCOUNTER — Encounter

## 2023-09-04 ENCOUNTER — Ambulatory Visit (HOSPITAL_COMMUNITY)
Admission: EM | Admit: 2023-09-04 | Discharge: 2023-09-04 | Disposition: A | Discharge status: Home / Self Care | Attending: Emergency Medicine | Admitting: Emergency Medicine

## 2023-09-04 ENCOUNTER — Encounter (HOSPITAL_COMMUNITY): Payer: Self-pay

## 2023-09-04 DIAGNOSIS — B001 Herpesviral vesicular dermatitis: Secondary | ICD-10-CM

## 2023-09-04 DIAGNOSIS — H6501 Acute serous otitis media, right ear: Secondary | ICD-10-CM | POA: Diagnosis not present

## 2023-09-04 MED ORDER — AMOXICILLIN-POT CLAVULANATE 875-125 MG PO TABS
1.0000 | ORAL_TABLET | Freq: Two times a day (BID) | ORAL | 0 refills | Status: DC
Start: 2023-09-04 — End: 2023-11-25

## 2023-09-04 MED ORDER — ACYCLOVIR 400 MG PO TABS: 400.0000 mg | ORAL_TABLET | Freq: Four times a day (QID) | ORAL | 0 refills | Status: DC

## 2023-09-04 NOTE — ED Provider Notes (Signed)
 [facility]    CSN: [id number] Arrival date & time: 09/04/23  0801      History   Chief Complaint Chief Complaint  Patient presents with   Otalgia   Nasal Congestion    HPI Sherry Schmidt is a 28 y.o. female.   Patient presents today with bilateral ear pain and a cold sore to left upper lip x 2 weeks.  She had a e-visit approximately 1 week ago and was prescribed nasal spray which has helped the congestion but she continues to have bilateral ear pain worse on the right side.  No longer has the nasal congestion or cough.  No chest pain no known fever.  Patient has also been taking Abreva over-the-counter for the cold sore which is not causing any change    Past Medical History:  Diagnosis Date   Asthma    Back pain    Back pain    Depression    Panic attacks    Thyroid  disease     Patient Active Problem List   Diagnosis Date Noted   BMI 33.0-33.9,adult, Current BMI 33.0 05/23/2022   Obesity (HCC)- Start BMI 41.16 05/23/2022   Pre-diabetes 09/27/2021   Hypothyroidism 05/12/2020   Transaminitis 03/06/2020   Abnormal thyroid  blood test 03/06/2020   Mixed hyperlipidemia 03/06/2020   Vitamin D  deficiency 02/18/2020   Insulin  resistance 02/18/2020   Depression with anxiety 02/18/2020   Class 3 severe obesity with serious comorbidity and body mass index (BMI) of 40.0 to 44.9 in adult 02/18/2020   Fatigue 11/06/2016   Postprocedural hypothyroidism 10/16/2016    Past Surgical History:  Procedure Laterality Date   THYROIDECTOMY      OB History     Gravida  0   Para  0   Term  0   Preterm  0   AB  0   Living  0      SAB  0   IAB  0   Ectopic  0   Multiple  0   Live Births  0            Home Medications    Prior to Admission medications   Medication Sig Start Date End Date Taking? Authorizing Provider  acyclovir (ZOVIRAX) 400 MG tablet Take 1 tablet (400 mg total) by mouth 4 (four) times daily. 09/04/23  Yes Harden Leyden, NP  amoxicillin -clavulanate (AUGMENTIN ) 875-125 MG tablet Take 1 tablet by mouth every 12 (twelve) hours. 09/04/23  Yes Harden Leyden, NP  acetaminophen  (TYLENOL ) 325 MG tablet Take 2 tablets (650 mg total) by mouth every 6 (six) hours as needed for moderate pain (pain score 4-6). 07/02/23  Yes Adolph Hoop, PA-C  albuterol  (VENTOLIN  HFA) 108 (90 Base) MCG/ACT inhaler Inhale 2 puffs into the lungs every 4 (four) hours as needed for wheezing or shortness of breath. 07/04/22  Yes Ann Keto, MD  Budesonide  90 MCG/ACT inhaler Inhale 1 puff into the lungs 2 (two) times daily. 11/05/22  Yes Farris Hong, PA-C  cholecalciferol (VITAMIN D3) 25 MCG (1000 UNIT) tablet Take 1,000 Units by mouth daily. 2 every day   Yes [provider]  cyclobenzaprine  (FLEXERIL ) 5 MG tablet Take 1 tablet (5 mg total) by mouth 3 (three) times daily as needed for muscle spasms. 02/04/23  Yes Adolph Hoop, PA-C  fluticasone  (FLONASE ) 50 MCG/ACT nasal spray Place 2 sprays into both nostrils daily. 08/28/23  Yes Angelia Kelp, PA-C  hydrOXYzine  (ATARAX ) 10 MG tablet Take  1 tablet (10 mg total) by mouth 2 (two) times daily as needed for anxiety. 10/15/22   Banister, Pamela K, MD  ipratropium (ATROVENT ) 0.03 % nasal spray Place 2 sprays into both nostrils every 12 (twelve) hours. 05/21/23  Yes Farris Hong, PA-C  levothyroxine  (SYNTHROID ) 100 MCG tablet Take 1 tablet (100 mcg total) by mouth daily before breakfast. 05/15/23  Yes Opalski, Bernardo Bridgeman, DO  lidocaine  (LIDODERM ) 5 % Place 2 patches onto the skin daily. Remove & Discard patch within 12 hours or as directed by MD 05/25/23  Yes Keith, Kayla N, PA-C  metaxalone  (SKELAXIN ) 800 MG tablet Take 1 tablet (800 mg total) by mouth 3 (three) times daily. 05/25/23  Yes Keith, Kayla N, PA-C  metFORMIN  (GLUCOPHAGE ) 500 MG tablet 1 with breakfast and 1 with lunch daily 04/09/23  Yes Opalski, Bernardo Bridgeman, DO  mupirocin  cream (BACTROBAN ) 2 % Apply 1 Application topically 2  (two) times daily. 01/14/23  Yes Eloise Hake Scales, PA-C  predniSONE  (STERAPRED UNI-PAK 21 TAB) 10 MG (21) TBPK tablet Take following package directions 08/27/23  Yes Farris Hong, PA-C  sertraline  (ZOLOFT ) 100 MG tablet Take 1 tablet (100 mg total) by mouth daily. 05/27/23  Yes Opalski, Deborah, DO  Vitamin D , Ergocalciferol , (DRISDOL ) 1.25 MG (50000 UNIT) CAPS capsule Take 1 capsule (50,000 Units total) by mouth every 7 (seven) days. 04/09/23  Yes Opalski, Bernardo Bridgeman, DO    Family History Family History  Problem Relation Age of Onset   Hypertension Mother    Depression Mother    Bipolar disorder Mother    Drug abuse Mother    Obesity Mother    Diabetes Father    Thyroid  disease Father    Cancer Father    Depression Father    Anxiety disorder Father    Bipolar disorder Father    Schizophrenia Father    Liver disease Father    Sleep apnea Father    Alcohol abuse Father    Drug abuse Father    Obesity Father     Social History Social History   Tobacco Use   Smoking status: Never   Smokeless tobacco: Never  Vaping Use   Vaping status: Never Used  Substance Use Topics   Alcohol use: Yes    Comment: occasional   Drug use: No     Allergies   Patient has no known allergies.   Review of Systems Review of Systems  Constitutional:        Unknown fever has not measured  HENT:  Positive for ear pain. Negative for postnasal drip, rhinorrhea, sinus pressure, sinus pain and sore throat.        Bilateral ear pain worse on the right side at x 2 weeks  Respiratory: Negative.    Cardiovascular: Negative.   Genitourinary: Negative.   Musculoskeletal: Negative.   Neurological: Negative.      Physical Exam Triage Vital Signs ED Triage Vitals [09/04/23 0811]  Encounter Vitals Group     BP 115/72     Systolic BP Percentile      Diastolic BP Percentile      Pulse Rate 72     Resp 18     Temp 98 F (36.7 C)     Temp Source Oral     SpO2 98 %     Weight      Height       Head Circumference      Peak Flow      Pain Score  Pain Loc      Pain Education      Exclude from Growth Chart    No data found.  Updated Vital Signs BP 115/72 (BP Location: Left Arm)   Pulse 72   Temp 98 F (36.7 C) (Oral)   Resp 18   LMP 08/14/2023 (Approximate)   SpO2 98%   Visual Acuity Right Eye Distance:   Left Eye Distance:   Bilateral Distance:    Right Eye Near:   Left Eye Near:    Bilateral Near:     Physical Exam Constitutional:      Appearance: Normal appearance.  HENT:     Right Ear: Tenderness present. A middle ear effusion is present. Tympanic membrane is erythematous and bulging.     Left Ear: Tenderness present. A middle ear effusion is present. Tympanic membrane is erythematous and bulging.     Nose: Nose normal.     Mouth/Throat:     Mouth: Mucous membranes are moist.  Eyes:     Pupils: Pupils are equal, round, and reactive to light.  Cardiovascular:     Rate and Rhythm: Normal rate.  Pulmonary:     Effort: Pulmonary effort is normal.  Abdominal:     General: Abdomen is flat.  Skin:    General: Skin is warm.  Neurological:     General: No focal deficit present.     Mental Status: She is alert.      UC Treatments / Results  Labs (all labs ordered are listed, but only abnormal results are displayed) Labs Reviewed - No data to display  EKG   Radiology No results found.  Procedures Procedures (including critical care time)  Medications Ordered in UC Medications - No data to display  Initial Impression / Assessment and Plan / UC Course  I have reviewed the triage vital signs and the nursing notes.  Pertinent labs & imaging results that were available during my care of the patient were reviewed by me and considered in my medical decision making (see chart for details).     Take full dose of antibiotics even if you start feel better Take medication with food Take Tylenol  or Motrin  as needed for pain or fever Take  Claritin  or Zyrtec  daily for the next few weeks Take acyclovir as needed for for cold sores you can continue to use the over-the-counter Abreva as needed  Final Clinical Impressions(s) / UC Diagnoses   Final diagnoses:  Non-recurrent acute serous otitis media of right ear  Cold sore     Discharge Instructions      Take full dose of antibiotics even if you start feel better Take medication with food Take Tylenol  or Motrin  as needed for pain or fever Take Claritin  or Zyrtec  daily for the next few weeks Take acyclovir as needed for for cold sores you can continue to use the over-the-counter Abreva as needed   ED Prescriptions     Medication Sig Dispense Auth. Provider   amoxicillin -clavulanate (AUGMENTIN ) 875-125 MG tablet Take 1 tablet by mouth every 12 (twelve) hours. 14 tablet Horacio Lyell L, NP   acyclovir (ZOVIRAX) 400 MG tablet Take 1 tablet (400 mg total) by mouth 4 (four) times daily. 50 tablet Harden Leyden, NP      PDMP not reviewed this encounter.   Harden Leyden, NP 09/04/23 [phone]

## 2023-09-04 NOTE — Discharge Instructions (Addendum)
 Take full dose of antibiotics even if you start feel better Take medication with food Take Tylenol  or Motrin  as needed for pain or fever Take Claritin  or Zyrtec  daily for the next few weeks Take acyclovir as needed for for cold sores you can continue to use the over-the-counter Abreva as needed

## 2023-09-04 NOTE — ED Triage Notes (Signed)
 Patient presents to the office for bilateral ear pain x 3 days.  Home Intervention: Nasal spray.

## 2023-09-16 ENCOUNTER — Telehealth: Payer: Self-pay | Admitting: Physician Assistant

## 2023-09-16 DIAGNOSIS — H9203 Otalgia, bilateral: Secondary | ICD-10-CM

## 2023-09-16 NOTE — Progress Notes (Signed)
  Because of ongoing symptoms despite multiple treatments, including treatment via e-visit, I feel your condition warrants further evaluation and I recommend that you be seen in a face-to-face visit. I would first reach out to your PCP office this morning for evaluation. Your PCP may need to refer you to an ENT provider if symptoms continue despite further primary care management.   NOTE: There will be NO CHARGE for this E-Visit   If you are having a true medical emergency, please call 911.     For an urgent face to face visit, [place] has multiple urgent care centers for your convenience.  Click the link below for the full list of locations and hours, walk-in wait times, appointment scheduling options and driving directions:  Urgent Care - [place], [place], [place], [place], [place], [place]  [place]     Your MyChart E-visit questionnaire answers were reviewed by a board certified advanced clinical practitioner to complete your personal care plan based on your specific symptoms.    Thank you for using e-Visits.

## 2023-09-18 ENCOUNTER — Telehealth: Admitting: Physician Assistant

## 2023-09-18 DIAGNOSIS — H1032 Unspecified acute conjunctivitis, left eye: Secondary | ICD-10-CM | POA: Diagnosis not present

## 2023-09-18 MED ORDER — POLYMYXIN B-TRIMETHOPRIM 10000-0.1 UNIT/ML-% OP SOLN: OPHTHALMIC | 0 refills | Status: DC

## 2023-09-18 NOTE — Progress Notes (Signed)

## 2023-09-18 NOTE — Progress Notes (Signed)
 I have spent 5 minutes in review of e-visit questionnaire, review and updating patient chart, medical decision making and response to patient.   [name], PA-C

## 2023-09-22 ENCOUNTER — Ambulatory Visit: Admitting: Family Medicine

## 2023-09-22 ENCOUNTER — Encounter: Payer: Self-pay | Admitting: Family Medicine

## 2023-09-22 NOTE — Progress Notes (Deleted)
 Office Note 09/22/2023  CC: No chief complaint on file.   HPI:  Sherry Schmidt is a 28 y.o. female who is here to establish care, discuss thyroid . Patient's most recent primary MD: *** Old records in epic/health Link were reviewed prior to or during today's visit.  ***  Past Medical History:  Diagnosis Date   Asthma    Back pain    Back pain    Depression    Panic attacks    Thyroid  disease     Past Surgical History:  Procedure Laterality Date   THYROIDECTOMY      Family History  Problem Relation Age of Onset   Hypertension Mother    Depression Mother    Bipolar disorder Mother    Drug abuse Mother    Obesity Mother    Diabetes Father    Thyroid  disease Father    Cancer Father    Depression Father    Anxiety disorder Father    Bipolar disorder Father    Schizophrenia Father    Liver disease Father    Sleep apnea Father    Alcohol abuse Father    Drug abuse Father    Obesity Father     Social History   Socioeconomic History   Marital status: Significant Other    Spouse name: Not on file   Number of children: Not on file   Years of education: Not on file   Highest education level: Not on file  Occupational History   Not on file  Tobacco Use   Smoking status: Never   Smokeless tobacco: Never  Vaping Use   Vaping status: Never Used  Substance and Sexual Activity   Alcohol use: Yes    Comment: occasional   Drug use: No   Sexual activity: Yes    Birth control/protection: None  Other Topics Concern   Not on file  Social History Narrative   Not on file   Social Drivers of Health   Financial Resource Strain: Not on file  Food Insecurity: Not on file  Transportation Needs: Not on file  Physical Activity: Not on file  Stress: Not on file  Social Connections: Not on file  Intimate Partner Violence: Not on file    Outpatient Encounter Medications as of 09/22/2023  Medication Sig   acetaminophen  (TYLENOL ) 325 MG tablet Take 2 tablets  (650 mg total) by mouth every 6 (six) hours as needed for moderate pain (pain score 4-6).   acyclovir  (ZOVIRAX ) 400 MG tablet Take 1 tablet (400 mg total) by mouth 4 (four) times daily.   albuterol  (VENTOLIN  HFA) 108 (90 Base) MCG/ACT inhaler Inhale 2 puffs into the lungs every 4 (four) hours as needed for wheezing or shortness of breath.   amoxicillin -clavulanate (AUGMENTIN ) 875-125 MG tablet Take 1 tablet by mouth every 12 (twelve) hours.   Budesonide  90 MCG/ACT inhaler Inhale 1 puff into the lungs 2 (two) times daily.   cholecalciferol (VITAMIN D3) 25 MCG (1000 UNIT) tablet Take 1,000 Units by mouth daily. 2 every day   cyclobenzaprine  (FLEXERIL ) 5 MG tablet Take 1 tablet (5 mg total) by mouth 3 (three) times daily as needed for muscle spasms.   fluticasone  (FLONASE ) 50 MCG/ACT nasal spray Place 2 sprays into both nostrils daily.   hydrOXYzine  (ATARAX ) 10 MG tablet Take 1 tablet (10 mg total) by mouth 2 (two) times daily as needed for anxiety.   ipratropium (ATROVENT ) 0.03 % nasal spray Place 2 sprays into both nostrils every 12 (twelve) hours.  levothyroxine  (SYNTHROID ) 100 MCG tablet Take 1 tablet (100 mcg total) by mouth daily before breakfast.   lidocaine  (LIDODERM ) 5 % Place 2 patches onto the skin daily. Remove & Discard patch within 12 hours or as directed by MD   metaxalone  (SKELAXIN ) 800 MG tablet Take 1 tablet (800 mg total) by mouth 3 (three) times daily.   metFORMIN  (GLUCOPHAGE ) 500 MG tablet 1 with breakfast and 1 with lunch daily   mupirocin  cream (BACTROBAN ) 2 % Apply 1 Application topically 2 (two) times daily.   predniSONE  (STERAPRED UNI-PAK 21 TAB) 10 MG (21) TBPK tablet Take following package directions   sertraline  (ZOLOFT ) 100 MG tablet Take 1 tablet (100 mg total) by mouth daily.   trimethoprim -polymyxin b  (POLYTRIM ) ophthalmic solution Apply 1-2 drops into affected eye QID x 5 days.   Vitamin D , Ergocalciferol , (DRISDOL ) 1.25 MG (50000 UNIT) CAPS capsule Take 1 capsule  (50,000 Units total) by mouth every 7 (seven) days.   No facility-administered encounter medications on file as of 09/22/2023.    No Known Allergies  Review of Systems *** PE; Last menstrual period 08/14/2023. There is no height or weight on file to calculate BMI.  Physical Exam  *** Pertinent labs:  Last CBC Lab Results  Component Value Date   WBC 12.2 (H) 10/15/2022   HGB 14.7 10/15/2022   HCT 43.0 10/15/2022   MCV 90.9 10/15/2022   MCH 31.1 10/15/2022   RDW 14.0 10/15/2022   PLT 310 10/15/2022   Last metabolic panel Lab Results  Component Value Date   GLUCOSE 88 10/15/2022   NA 138 10/15/2022   K 4.2 10/15/2022   CL 106 10/15/2022   CO2 21 (L) 10/15/2022   BUN 17 10/15/2022   CREATININE 0.78 10/15/2022   GFRNONAA >60 10/15/2022   CALCIUM  8.5 (L) 10/15/2022   PROT 6.6 07/30/2022   ALBUMIN 4.5 07/30/2022   LABGLOB 2.1 07/30/2022   AGRATIO 2.1 07/30/2022   BILITOT 0.4 07/30/2022   ALKPHOS 80 07/30/2022   AST 16 07/30/2022   ALT 16 07/30/2022   ANIONGAP 11 10/15/2022   Last lipids Lab Results  Component Value Date   CHOL 187 12/14/2020   HDL 46 12/14/2020   LDLCALC 117 (H) 12/14/2020   TRIG 135 12/14/2020   Last hemoglobin A1c Lab Results  Component Value Date   HGBA1C 5.4 07/30/2022   Last thyroid  functions Lab Results  Component Value Date   TSH 5.291 (H) 10/15/2022   T3TOTAL 88 03/06/2020   T4TOTAL 5.9 03/06/2020   Last vitamin D  Lab Results  Component Value Date   VD25OH 36.7 07/30/2022   Last vitamin B12 and Folate Lab Results  Component Value Date   VITAMINB12 417 11/10/2019   FOLATE 14.9 11/10/2019   ASSESSMENT AND PLAN:   No problem-specific Assessment & Plan notes found for this encounter.   An After Visit Summary was printed and given to the patient.  No follow-ups on file.  Signed:  Gerlene Hockey, MD           09/22/2023

## 2023-09-29 ENCOUNTER — Ambulatory Visit
Admission: EM | Admit: 2023-09-29 | Discharge: 2023-09-29 | Disposition: A | Discharge status: Home / Self Care | Attending: Family Medicine | Admitting: Family Medicine

## 2023-09-29 DIAGNOSIS — H6993 Unspecified Eustachian tube disorder, bilateral: Secondary | ICD-10-CM

## 2023-09-29 DIAGNOSIS — J310 Chronic rhinitis: Secondary | ICD-10-CM

## 2023-09-29 MED ORDER — MECLIZINE HCL 12.5 MG PO TABS: 12.5000 mg | ORAL_TABLET | Freq: Three times a day (TID) | ORAL | 0 refills | Status: DC | PRN

## 2023-09-29 MED ORDER — PREDNISONE 20 MG PO TABS: ORAL_TABLET | ORAL | 0 refills | Status: DC

## 2023-09-29 NOTE — ED Triage Notes (Signed)
 Pt c/o dizziness, nasal congestion, HA x 1 week-ear fullness x 2 weeks-denies fever-NAD-steady gait

## 2023-09-29 NOTE — ED Provider Notes (Signed)
 [facility]  Note:  This document was prepared using Conservation officer, historic buildings and may include unintentional dictation errors.  MRN: [id number] DOB: 11/27/1995  Subjective:   Sherry Schmidt is a 28 y.o. female presenting for 1 month history of sinus congestion, bilateral ear fullness worse on the right, sinus headaches, now having dizziness.  Patient has already been seen 2 weeks ago and underwent a course of Augmentin .  She is also taking nasal steroid, Allegra daily.  No confusion, weakness, numbness or tingling, vision changes, rashes, chest pain, shortness of breath. No smoking of any kind including cigarettes, cigars, vaping, marijuana use.    No current facility-administered medications for this encounter.  Current Outpatient Medications:    acetaminophen  (TYLENOL ) 325 MG tablet, Take 2 tablets (650 mg total) by mouth every 6 (six) hours as needed for moderate pain (pain score 4-6)., Disp: 30 tablet, Rfl: 0   acyclovir  (ZOVIRAX ) 400 MG tablet, Take 1 tablet (400 mg total) by mouth 4 (four) times daily., Disp: 50 tablet, Rfl: 0   albuterol  (VENTOLIN  HFA) 108 (90 Base) MCG/ACT inhaler, Inhale 2 puffs into the lungs every 4 (four) hours as needed for wheezing or shortness of breath., Disp: 1 each, Rfl: 0   amoxicillin -clavulanate (AUGMENTIN ) 875-125 MG tablet, Take 1 tablet by mouth every 12 (twelve) hours., Disp: 14 tablet, Rfl: 0   Budesonide  90 MCG/ACT inhaler, Inhale 1 puff into the lungs 2 (two) times daily., Disp: 1 each, Rfl: 0   cholecalciferol (VITAMIN D3) 25 MCG (1000 UNIT) tablet, Take 1,000 Units by mouth daily. 2 every day, Disp: , Rfl:    cyclobenzaprine  (FLEXERIL ) 5 MG tablet, Take 1 tablet (5 mg total) by mouth 3 (three) times daily as needed for muscle spasms., Disp: 30 tablet, Rfl: 0   fluticasone  (FLONASE ) 50 MCG/ACT nasal spray, Place 2 sprays into both nostrils daily., Disp: 16 g, Rfl: 0   hydrOXYzine  (ATARAX ) 10 MG tablet, Take 1  tablet (10 mg total) by mouth 2 (two) times daily as needed for anxiety., Disp: 10 tablet, Rfl: 0   ipratropium (ATROVENT ) 0.03 % nasal spray, Place 2 sprays into both nostrils every 12 (twelve) hours., Disp: 30 mL, Rfl: 0   levothyroxine  (SYNTHROID ) 100 MCG tablet, Take 1 tablet (100 mcg total) by mouth daily before breakfast., Disp: 90 tablet, Rfl: 0   lidocaine  (LIDODERM ) 5 %, Place 2 patches onto the skin daily. Remove & Discard patch within 12 hours or as directed by MD, Disp: 60 patch, Rfl: 0   metaxalone  (SKELAXIN ) 800 MG tablet, Take 1 tablet (800 mg total) by mouth 3 (three) times daily., Disp: 21 tablet, Rfl: 0   metFORMIN  (GLUCOPHAGE ) 500 MG tablet, 1 with breakfast and 1 with lunch daily, Disp: 180 tablet, Rfl: 0   mupirocin  cream (BACTROBAN ) 2 %, Apply 1 Application topically 2 (two) times daily., Disp: 30 g, Rfl: 0   predniSONE  (STERAPRED UNI-PAK 21 TAB) 10 MG (21) TBPK tablet, Take following package directions, Disp: 21 tablet, Rfl: 0   sertraline  (ZOLOFT ) 100 MG tablet, Take 1 tablet (100 mg total) by mouth daily., Disp: 30 tablet, Rfl: 1   trimethoprim -polymyxin b  (POLYTRIM ) ophthalmic solution, Apply 1-2 drops into affected eye QID x 5 days., Disp: 10 mL, Rfl: 0   Vitamin D , Ergocalciferol , (DRISDOL ) 1.25 MG (50000 UNIT) CAPS capsule, Take 1 capsule (50,000 Units total) by mouth every 7 (seven) days., Disp: 12 capsule, Rfl: 0   No Known Allergies  Past Medical History:  Diagnosis Date   Anxiety and depression    Asthma    Back pain    Postsurgical hypothyroidism    Thyroid  disease      Past Surgical History:  Procedure Laterality Date   THYROIDECTOMY      Family History  Problem Relation Age of Onset   Hypertension Mother    Depression Mother    Bipolar disorder Mother    Drug abuse Mother    Obesity Mother    Diabetes Father    Thyroid  disease Father    Cancer Father    Depression Father    Anxiety disorder Father    Bipolar disorder Father    Schizophrenia  Father    Liver disease Father    Sleep apnea Father    Alcohol abuse Father    Drug abuse Father    Obesity Father     Social History   Tobacco Use   Smoking status: Never   Smokeless tobacco: Never  Vaping Use   Vaping status: Never Used  Substance Use Topics   Alcohol use: Yes    Comment: occasional   Drug use: No    ROS   Objective:   Vitals: BP 116/84 (BP Location: Left Arm)   Pulse 71   Temp 99 F (37.2 C) (Oral)   Resp 16   LMP 09/08/2023 (Approximate)   SpO2 97%   Physical Exam Constitutional:      General: She is not in acute distress.    Appearance: Normal appearance. She is well-developed and normal weight. She is not ill-appearing, toxic-appearing or diaphoretic.  HENT:     Head: Normocephalic and atraumatic.     Right Ear: Tympanic membrane, ear canal and external ear normal. No drainage or tenderness. No middle ear effusion. There is no impacted cerumen. Tympanic membrane is not erythematous or bulging.     Left Ear: Tympanic membrane, ear canal and external ear normal. No drainage or tenderness.  No middle ear effusion. There is no impacted cerumen. Tympanic membrane is not erythematous or bulging.     Nose: Nose normal. No congestion or rhinorrhea.     Mouth/Throat:     Mouth: Mucous membranes are moist. No oral lesions.     Pharynx: No pharyngeal swelling, oropharyngeal exudate, posterior oropharyngeal erythema or uvula swelling.     Tonsils: No tonsillar exudate or tonsillar abscesses.   Eyes:     General: No scleral icterus.       Right eye: No discharge.        Left eye: No discharge.     Extraocular Movements: Extraocular movements intact.     Right eye: Normal extraocular motion.     Left eye: Normal extraocular motion.     Conjunctiva/sclera: Conjunctivae normal.    Cardiovascular:     Rate and Rhythm: Normal rate and regular rhythm.     Heart sounds: Normal heart sounds. No murmur heard.    No friction rub. No gallop.  Pulmonary:      Effort: Pulmonary effort is normal. No respiratory distress.     Breath sounds: No stridor. No wheezing, rhonchi or rales.  Chest:     Chest wall: No tenderness.   Musculoskeletal:     Cervical back: Normal range of motion and neck supple.  Lymphadenopathy:     Cervical: No cervical adenopathy.   Skin:    General: Skin is warm and dry.   Neurological:     General: No focal deficit present.  Mental Status: She is alert and oriented to person, place, and time.   Psychiatric:        Mood and Affect: Mood normal.        Behavior: Behavior normal.     Assessment and Plan :   PDMP not reviewed this encounter.  1. Eustachian tube dysfunction, bilateral   2. Chronic rhinitis    Unremarkable ENT exam.  Given the nature of her persistent symptoms despite antibiotics, over-the-counter medication recommend an oral prednisone  course.  Can use meclizine as needed for symptomatic relief.  Will defer further antibiotic use at this stage.  Counseled patient on potential for adverse effects with medications prescribed/recommended today, ER and return-to-clinic precautions discussed, patient verbalized understanding.    Christopher Savannah, [place] 09/29/23 [phone]

## 2023-10-08 ENCOUNTER — Telehealth

## 2023-10-08 DIAGNOSIS — G8929 Other chronic pain: Secondary | ICD-10-CM

## 2023-10-09 NOTE — Progress Notes (Signed)
  Because of recurring issues with your ear, and multiple visits with our team recently and UC recently for this issue, I feel your condition warrants further evaluation and I recommend that you be seen in a face-to-face visit with your PCP or at local urgent care for evaluation this morning.   NOTE: There will be NO CHARGE for this E-Visit   If you are having a true medical emergency, please call 911.     For an urgent face to face visit, [facility] has multiple urgent care centers for your convenience.  Click the link below for the full list of locations and hours, walk-in wait times, appointment scheduling options and driving directions:  Urgent Care - [place], [place], [place], [place], [place], [place]  [place]     Your MyChart E-visit questionnaire answers were reviewed by a board certified advanced clinical practitioner to complete your personal care plan based on your specific symptoms.    Thank you for using e-Visits.

## 2023-11-24 DIAGNOSIS — F330 Major depressive disorder, recurrent, mild: Secondary | ICD-10-CM | POA: Insufficient documentation

## 2023-11-24 DIAGNOSIS — F411 Generalized anxiety disorder: Secondary | ICD-10-CM | POA: Insufficient documentation

## 2023-11-25 ENCOUNTER — Encounter: Payer: Self-pay | Admitting: Family Medicine

## 2023-11-25 ENCOUNTER — Ambulatory Visit: Admitting: Family Medicine

## 2023-11-25 VITALS — BP 110/86 | HR 70 | Temp 97.9°F | Ht 73.0 in | Wt 290.6 lb

## 2023-11-25 DIAGNOSIS — R7401 Elevation of levels of liver transaminase levels: Secondary | ICD-10-CM

## 2023-11-25 DIAGNOSIS — Z124 Encounter for screening for malignant neoplasm of cervix: Secondary | ICD-10-CM

## 2023-11-25 DIAGNOSIS — F330 Major depressive disorder, recurrent, mild: Secondary | ICD-10-CM

## 2023-11-25 DIAGNOSIS — Z79899 Other long term (current) drug therapy: Secondary | ICD-10-CM

## 2023-11-25 DIAGNOSIS — E039 Hypothyroidism, unspecified: Secondary | ICD-10-CM | POA: Diagnosis not present

## 2023-11-25 DIAGNOSIS — F411 Generalized anxiety disorder: Secondary | ICD-10-CM

## 2023-11-25 DIAGNOSIS — E782 Mixed hyperlipidemia: Secondary | ICD-10-CM

## 2023-11-25 DIAGNOSIS — R7303 Prediabetes: Secondary | ICD-10-CM

## 2023-11-25 DIAGNOSIS — F332 Major depressive disorder, recurrent severe without psychotic features: Secondary | ICD-10-CM | POA: Diagnosis not present

## 2023-11-25 DIAGNOSIS — E559 Vitamin D deficiency, unspecified: Secondary | ICD-10-CM

## 2023-11-25 DIAGNOSIS — E88819 Insulin resistance, unspecified: Secondary | ICD-10-CM

## 2023-11-25 MED ORDER — LEVOTHYROXINE SODIUM 100 MCG PO TABS: 100.0000 ug | ORAL_TABLET | Freq: Every day | ORAL | 0 refills | Status: DC

## 2023-11-25 MED ORDER — SERTRALINE HCL 100 MG PO TABS: 100.0000 mg | ORAL_TABLET | Freq: Every day | ORAL | 1 refills | Status: DC

## 2023-11-25 MED ORDER — METFORMIN HCL 500 MG PO TABS: ORAL_TABLET | ORAL | 0 refills | Status: DC

## 2023-11-25 MED ORDER — HYDROXYZINE HCL 10 MG PO TABS: 10.0000 mg | ORAL_TABLET | Freq: Two times a day (BID) | ORAL | 0 refills | Status: DC | PRN

## 2023-11-25 NOTE — Patient Instructions (Signed)
 Welcome to [facility]!  Thank you for choosing us  for your Primary Care needs.   We offer in person and video appointments for your convenience. You may call our office to schedule appointments, or you may schedule appointments with me through MyChart.   The best way to get in contact with me is via MyChart message. This will get to me faster than a phone call, unless there is an emergency, then please call 911.  The lab is located downstairs in the Sports Medicine building, we also have xray available there.   Follow up with me for medication evaluation and labs in 2 months.   Refills have been sent to your pharmacy today.   Referrals have been placed for therapy and to gynecology today. I would give them a call in about a week to get scheduled.

## 2023-11-25 NOTE — Progress Notes (Signed)
 New Patient Visit  Subjective:     Patient ID: Sherry Schmidt, female    DOB: October 25, 1995, 28 y.o.   MRN: [id number]  Chief Complaint  Patient presents with   Establish Care    HPI  Discussed the use of AI scribe software for clinical note transcription with the patient, who gave verbal consent to proceed.  History of Present Illness Sherry Schmidt is a 28 year old female with depression and hypothyroidism who presents for medication refills and follow-up.  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   Budesonide  90 MCG/ACT inhaler Inhale 1 puff into the lungs 2 (two) times daily.   cholecalciferol (VITAMIN D3) 25 MCG (1000 UNIT) tablet Take 1,000 Units by mouth daily. 2 every day   fluticasone  (FLONASE ) 50 MCG/ACT nasal spray Place 2 sprays into both nostrils daily.   mupirocin  cream (BACTROBAN ) 2 % Apply 1 Application topically 2 (two) times daily.   [DISCONTINUED] hydrOXYzine  (ATARAX ) 10 MG tablet Take 1 tablet (10 mg total) by mouth 2 (two) times daily as needed for anxiety.   [DISCONTINUED] levothyroxine  (SYNTHROID ) 100 MCG tablet Take 1 tablet (100 mcg total) by mouth daily before breakfast.   [DISCONTINUED] metFORMIN  (GLUCOPHAGE ) 500 MG tablet 1 with breakfast and 1 with lunch daily   [DISCONTINUED] sertraline   (ZOLOFT ) 100 MG tablet Take 1 tablet (100 mg total) by mouth daily.   hydrOXYzine  (ATARAX ) 10 MG tablet Take 1 tablet (10 mg total) by mouth 2 (two) times daily as needed for anxiety.   levothyroxine  (SYNTHROID ) 100 MCG tablet Take 1 tablet (100 mcg total) by mouth daily before breakfast.   metFORMIN  (GLUCOPHAGE ) 500 MG tablet 1 with breakfast and 1 with lunch daily   sertraline  (ZOLOFT ) 100 MG tablet Take 1 tablet (100 mg total) by mouth daily.   [DISCONTINUED] acetaminophen  (TYLENOL ) 325 MG tablet Take 2 tablets (650 mg total) by mouth every 6 (six) hours as needed for moderate pain (pain score 4-6). (Patient not taking: Reported on 11/25/2023)   [DISCONTINUED] acyclovir  (ZOVIRAX ) 400 MG tablet Take 1 tablet (400 mg total) by mouth 4 (four) times daily. (Patient not taking: Reported on 11/25/2023)   [DISCONTINUED] amoxicillin -clavulanate (AUGMENTIN ) 875-125 MG tablet Take 1 tablet by mouth every 12 (twelve) hours. (Patient not taking: Reported on 11/25/2023)   [DISCONTINUED] cyclobenzaprine  (FLEXERIL ) 5 MG tablet Take 1 tablet (5 mg total) by mouth 3 (three) times daily as needed for muscle spasms. (Patient not taking: Reported on 11/25/2023)   [DISCONTINUED] ipratropium (ATROVENT ) 0.03 % nasal spray Place 2 sprays into both nostrils every 12 (twelve) hours. (Patient not taking: Reported on 11/25/2023)   [DISCONTINUED] lidocaine  (LIDODERM ) 5 % Place 2 patches onto the skin daily. Remove & Discard patch within 12 hours or as directed by MD (Patient not taking: Reported on 11/25/2023)   [DISCONTINUED] meclizine  (ANTIVERT ) 12.5 MG tablet Take 1 tablet (12.5 mg total) by mouth 3 (  three) times daily as needed for dizziness. (Patient not taking: Reported on 11/25/2023)   [DISCONTINUED] metaxalone  (SKELAXIN ) 800 MG tablet Take 1 tablet (800 mg total) by mouth 3 (three) times daily. (Patient not taking: Reported on 11/25/2023)   [DISCONTINUED] predniSONE  (DELTASONE ) 20 MG tablet Take 2 tablets daily with breakfast.  (Patient not taking: Reported on 11/25/2023)   [DISCONTINUED] trimethoprim -polymyxin b  (POLYTRIM ) ophthalmic solution Apply 1-2 drops into affected eye QID x 5 days. (Patient not taking: Reported on 11/25/2023)   [DISCONTINUED] Vitamin D , Ergocalciferol , (DRISDOL ) 1.25 MG (50000 UNIT) CAPS capsule Take 1 capsule (50,000 Units total) by mouth every 7 (seven) days. (Patient not taking: Reported on 11/25/2023)   No facility-administered encounter medications on file as of 11/25/2023.    Past Medical History:  Diagnosis Date   Anxiety and depression    Asthma    Back pain    Postsurgical hypothyroidism    Thyroid  disease     Past Surgical History:  Procedure Laterality Date   THYROIDECTOMY      Family History  Problem Relation Age of Onset   Hypertension Mother    Depression Mother    Bipolar disorder Mother    Drug abuse Mother    Obesity Mother    Diabetes Father    Thyroid  disease Father    Cancer Father    Depression Father    Anxiety disorder Father    Bipolar disorder Father    Schizophrenia Father    Liver disease Father    Sleep apnea Father    Alcohol abuse Father    Drug abuse Father    Obesity Father     Social History   Socioeconomic History   Marital status: Significant Other    Spouse name: Not on file   Number of children: Not on file   Years of education: Not on file   Highest education level: Not on file  Occupational History   Not on file  Tobacco Use   Smoking status: Never   Smokeless tobacco: Never  Vaping Use   Vaping status: Never Used  Substance and Sexual Activity   Alcohol use: Yes    Comment: occasional   Drug use: No   Sexual activity: Yes    Birth control/protection: None  Other Topics Concern   Not on file  Social History Narrative   Not on file   Social Drivers of Health   Financial Resource Strain: Not on file  Food Insecurity: Not on file  Transportation Needs: Not on file  Physical Activity: Not on file  Stress: Not  on file  Social Connections: Not on file  Intimate Partner Violence: Not on file       Objective:    BP 110/86 (BP Location: Left Arm, Patient Position: Sitting)   Pulse 70   Temp 97.9 F (36.6 C) (Temporal)   Ht 6' 1 (1.854 m)   Wt 290 lb 9.6 oz (131.8 kg)   LMP 11/07/2023   SpO2 97%   BMI 38.34 kg/m    Physical Exam Vitals and nursing note reviewed.  Constitutional:      General: She is not in acute distress.    Appearance: Normal appearance.  HENT:     Head: Normocephalic and atraumatic.     Right Ear: External ear normal.     Left Ear: External ear normal.     Nose: Nose normal.     Mouth/Throat:     Mouth: Mucous membranes are moist.  Pharynx: Oropharynx is clear.  Eyes:     Extraocular Movements: Extraocular movements intact.     Pupils: Pupils are equal, round, and reactive to light.  Cardiovascular:     Rate and Rhythm: Normal rate and regular rhythm.     Pulses: Normal pulses.     Heart sounds: Normal heart sounds.  Pulmonary:     Effort: Pulmonary effort is normal. No respiratory distress.     Breath sounds: Normal breath sounds. No wheezing, rhonchi or rales.  Musculoskeletal:        General: Normal range of motion.     Cervical back: Normal range of motion.     Right lower leg: No edema.     Left lower leg: No edema.  Lymphadenopathy:     Cervical: No cervical adenopathy.  Neurological:     General: No focal deficit present.     Mental Status: She is alert and oriented to person, place, and time.  Psychiatric:        Mood and Affect: Mood normal.        Thought Content: Thought content normal.     No results found for any visits on 11/25/23.      Assessment & Plan:   Assessment and Plan Assessment & Plan Major depressive disorder with suicidal ideation Current suicidal ideation without active plans. Symptoms worsen off medication. Previous therapy was not continued. 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 - Refilled Zoloft   prescription. - Referred to therapist for counseling. - Discussed importance of therapy and medication adherence. - Discussed community resources and emergency management  Insulin  Resistance/Prediabetes Managed with metformin  500 mg twice daily. - Refilled metformin  prescription.  Hypothyroidism Requires medication adjustment. Labs to be rechecked in two months to assess thyroid  function. - Refilled levothyroxine  prescription. - Plan to recheck thyroid  labs in two months.  Generalized anxiety disorder Managed with hydroxyzine  10 mg without drowsiness. No current therapist or psychiatrist involvement. - Refilled hydroxyzine  prescription. - Referred to therapist for counseling.  Cervical Cancer screen - Referral to GYN today     Orders Placed This Encounter  Procedures   Ambulatory referral to Obstetrics / Gynecology    Referral Priority:   Routine    Referral Type:   Consultation    Referral Reason:   Specialty Services Required    Requested Specialty:   Obstetrics and Gynecology    Number of Visits Requested:   1   Ambulatory referral to Psychiatry    Referral Priority:   Routine    Referral Type:   Psychiatric    Referral Reason:   Specialty Services Required    Requested Specialty:   Psychiatry    Number of Visits Requested:   1     Meds ordered this encounter  Medications   hydrOXYzine  (ATARAX ) 10 MG tablet    Sig: Take 1 tablet (10 mg total) by mouth 2 (two) times daily as needed for anxiety.    Dispense:  30 tablet    Refill:  0   levothyroxine  (SYNTHROID ) 100 MCG tablet    Sig: Take 1 tablet (100 mcg total) by mouth daily before breakfast.    Dispense:  90 tablet    Refill:  0   metFORMIN  (GLUCOPHAGE ) 500 MG tablet    Sig: 1 with breakfast and 1 with lunch daily    Dispense:  180 tablet    Refill:  0   sertraline  (ZOLOFT ) 100 MG tablet    Sig: Take 1 tablet (100 mg total) by mouth  daily.    Dispense:  90 tablet    Refill:  1    Return in about 2  months (around 01/25/2024) for med f/u, labs.  Corean LITTIE Ku, FNP

## 2023-11-27 ENCOUNTER — Telehealth: Admitting: Physician Assistant

## 2023-11-27 DIAGNOSIS — K529 Noninfective gastroenteritis and colitis, unspecified: Secondary | ICD-10-CM | POA: Diagnosis not present

## 2023-11-27 MED ORDER — ONDANSETRON 4 MG PO TBDP: 4.0000 mg | ORAL_TABLET | Freq: Three times a day (TID) | ORAL | 0 refills | Status: DC | PRN

## 2023-11-27 NOTE — Progress Notes (Signed)

## 2023-12-23 ENCOUNTER — Encounter (HOSPITAL_COMMUNITY): Payer: Self-pay

## 2023-12-23 ENCOUNTER — Emergency Department (HOSPITAL_COMMUNITY): Payer: PRIVATE HEALTH INSURANCE

## 2023-12-23 ENCOUNTER — Other Ambulatory Visit: Payer: Self-pay

## 2023-12-23 ENCOUNTER — Inpatient Hospital Stay (HOSPITAL_COMMUNITY)
Admission: EM | Admit: 2023-12-23 | Discharge: 2023-12-26 | DRG: 082 | Disposition: A | Discharge status: Home / Self Care | Payer: PRIVATE HEALTH INSURANCE

## 2023-12-23 DIAGNOSIS — R402252 Coma scale, best verbal response, oriented, at arrival to emergency department: Secondary | ICD-10-CM | POA: Diagnosis present

## 2023-12-23 DIAGNOSIS — Y907 Blood alcohol level of 200-239 mg/100 ml: Secondary | ICD-10-CM | POA: Diagnosis present

## 2023-12-23 DIAGNOSIS — Z813 Family history of other psychoactive substance abuse and dependence: Secondary | ICD-10-CM

## 2023-12-23 DIAGNOSIS — Z809 Family history of malignant neoplasm, unspecified: Secondary | ICD-10-CM | POA: Diagnosis not present

## 2023-12-23 DIAGNOSIS — Z8249 Family history of ischemic heart disease and other diseases of the circulatory system: Secondary | ICD-10-CM | POA: Diagnosis not present

## 2023-12-23 DIAGNOSIS — R402362 Coma scale, best motor response, obeys commands, at arrival to emergency department: Secondary | ICD-10-CM | POA: Diagnosis present

## 2023-12-23 DIAGNOSIS — E890 Postprocedural hypothyroidism: Secondary | ICD-10-CM | POA: Diagnosis present

## 2023-12-23 DIAGNOSIS — J690 Pneumonitis due to inhalation of food and vomit: Secondary | ICD-10-CM | POA: Diagnosis present

## 2023-12-23 DIAGNOSIS — Z833 Family history of diabetes mellitus: Secondary | ICD-10-CM | POA: Diagnosis not present

## 2023-12-23 DIAGNOSIS — Z1152 Encounter for screening for COVID-19: Secondary | ICD-10-CM

## 2023-12-23 DIAGNOSIS — E669 Obesity, unspecified: Secondary | ICD-10-CM | POA: Diagnosis present

## 2023-12-23 DIAGNOSIS — Z811 Family history of alcohol abuse and dependence: Secondary | ICD-10-CM

## 2023-12-23 DIAGNOSIS — S064XAA Epidural hemorrhage with loss of consciousness status unknown, initial encounter: Secondary | ICD-10-CM | POA: Diagnosis present

## 2023-12-23 DIAGNOSIS — Z79899 Other long term (current) drug therapy: Secondary | ICD-10-CM

## 2023-12-23 DIAGNOSIS — Z79890 Hormone replacement therapy (postmenopausal): Secondary | ICD-10-CM | POA: Diagnosis not present

## 2023-12-23 DIAGNOSIS — Y92410 Unspecified street and highway as the place of occurrence of the external cause: Secondary | ICD-10-CM | POA: Diagnosis not present

## 2023-12-23 DIAGNOSIS — Z7984 Long term (current) use of oral hypoglycemic drugs: Secondary | ICD-10-CM

## 2023-12-23 DIAGNOSIS — S32019A Unspecified fracture of first lumbar vertebra, initial encounter for closed fracture: Secondary | ICD-10-CM | POA: Diagnosis present

## 2023-12-23 DIAGNOSIS — F10129 Alcohol abuse with intoxication, unspecified: Secondary | ICD-10-CM | POA: Diagnosis present

## 2023-12-23 DIAGNOSIS — Z23 Encounter for immunization: Secondary | ICD-10-CM | POA: Diagnosis not present

## 2023-12-23 DIAGNOSIS — F0781 Postconcussional syndrome: Secondary | ICD-10-CM | POA: Diagnosis not present

## 2023-12-23 DIAGNOSIS — R29810 Facial weakness: Secondary | ICD-10-CM | POA: Diagnosis present

## 2023-12-23 DIAGNOSIS — S0219XA Other fracture of base of skull, initial encounter for closed fracture: Secondary | ICD-10-CM | POA: Diagnosis present

## 2023-12-23 DIAGNOSIS — F419 Anxiety disorder, unspecified: Secondary | ICD-10-CM | POA: Diagnosis present

## 2023-12-23 DIAGNOSIS — S06309D Unspecified focal traumatic brain injury with loss of consciousness of unspecified duration, subsequent encounter: Secondary | ICD-10-CM | POA: Diagnosis not present

## 2023-12-23 DIAGNOSIS — R402142 Coma scale, eyes open, spontaneous, at arrival to emergency department: Secondary | ICD-10-CM | POA: Diagnosis present

## 2023-12-23 DIAGNOSIS — F32A Depression, unspecified: Secondary | ICD-10-CM | POA: Diagnosis present

## 2023-12-23 DIAGNOSIS — Z818 Family history of other mental and behavioral disorders: Secondary | ICD-10-CM

## 2023-12-23 DIAGNOSIS — Z6841 Body Mass Index (BMI) 40.0 and over, adult: Secondary | ICD-10-CM | POA: Diagnosis not present

## 2023-12-23 DIAGNOSIS — J45909 Unspecified asthma, uncomplicated: Secondary | ICD-10-CM | POA: Diagnosis present

## 2023-12-23 DIAGNOSIS — S06309A Unspecified focal traumatic brain injury with loss of consciousness of unspecified duration, initial encounter: Principal | ICD-10-CM | POA: Diagnosis present

## 2023-12-23 DIAGNOSIS — Z8379 Family history of other diseases of the digestive system: Secondary | ICD-10-CM

## 2023-12-23 DIAGNOSIS — R040 Epistaxis: Secondary | ICD-10-CM | POA: Diagnosis present

## 2023-12-23 DIAGNOSIS — S0291XA Unspecified fracture of skull, initial encounter for closed fracture: Secondary | ICD-10-CM

## 2023-12-23 DIAGNOSIS — Z8349 Family history of other endocrine, nutritional and metabolic diseases: Secondary | ICD-10-CM

## 2023-12-23 LAB — RESP PANEL BY RT-PCR (RSV, FLU A&B, COVID)  RVPGX2
Influenza A by PCR: NEGATIVE
Influenza B by PCR: NEGATIVE
Resp Syncytial Virus by PCR: NEGATIVE
SARS Coronavirus 2 by RT PCR: NEGATIVE

## 2023-12-23 LAB — URINALYSIS, ROUTINE W REFLEX MICROSCOPIC
Bilirubin Urine: NEGATIVE
Glucose, UA: NEGATIVE mg/dL
Ketones, ur: NEGATIVE mg/dL
Leukocytes,Ua: NEGATIVE
Nitrite: NEGATIVE
Protein, ur: NEGATIVE mg/dL
Specific Gravity, Urine: 1.025 (ref 1.005–1.030)
pH: 5 (ref 5.0–8.0)

## 2023-12-23 LAB — CBC
HCT: 43.4 % (ref 36.0–46.0)
Hemoglobin: 14.5 g/dL (ref 12.0–15.0)
MCH: 30 pg (ref 26.0–34.0)
MCHC: 33.4 g/dL (ref 30.0–36.0)
MCV: 89.9 fL (ref 80.0–100.0)
Platelets: 283 K/uL (ref 150–400)
RBC: 4.83 MIL/uL (ref 3.87–5.11)
RDW: 12.8 % (ref 11.5–15.5)
WBC: 13.6 K/uL — ABNORMAL HIGH (ref 4.0–10.5)
nRBC: 0 % (ref 0.0–0.2)

## 2023-12-23 LAB — I-STAT CHEM 8, ED
BUN: 15 mg/dL (ref 6–20)
Calcium, Ion: 1.01 mmol/L — ABNORMAL LOW (ref 1.15–1.40)
Chloride: 105 mmol/L (ref 98–111)
Creatinine, Ser: 1.2 mg/dL — ABNORMAL HIGH (ref 0.44–1.00)
Glucose, Bld: 110 mg/dL — ABNORMAL HIGH (ref 70–99)
HCT: 41 % (ref 36.0–46.0)
Hemoglobin: 13.9 g/dL (ref 12.0–15.0)
Potassium: 3.2 mmol/L — ABNORMAL LOW (ref 3.5–5.1)
Sodium: 138 mmol/L (ref 135–145)
TCO2: 20 mmol/L — ABNORMAL LOW (ref 22–32)

## 2023-12-23 LAB — COMPREHENSIVE METABOLIC PANEL WITH GFR
ALT: 26 U/L (ref 0–44)
AST: 42 U/L — ABNORMAL HIGH (ref 15–41)
Albumin: 3.9 g/dL (ref 3.5–5.0)
Alkaline Phosphatase: 83 U/L (ref 38–126)
Anion gap: 14 (ref 5–15)
BUN: 13 mg/dL (ref 6–20)
CO2: 20 mmol/L — ABNORMAL LOW (ref 22–32)
Calcium: 8.8 mg/dL — ABNORMAL LOW (ref 8.9–10.3)
Chloride: 101 mmol/L (ref 98–111)
Creatinine, Ser: 0.9 mg/dL (ref 0.44–1.00)
GFR, Estimated: >60 mL/min (ref >60)
Glucose, Bld: 109 mg/dL — ABNORMAL HIGH (ref 70–99)
Potassium: 3.2 mmol/L — ABNORMAL LOW (ref 3.5–5.1)
Sodium: 135 mmol/L (ref 135–145)
Total Bilirubin: 0.6 mg/dL (ref 0.0–1.2)
Total Protein: 7.4 g/dL (ref 6.5–8.1)

## 2023-12-23 LAB — RAPID URINE DRUG SCREEN, HOSP PERFORMED
Amphetamines: NOT DETECTED
Barbiturates: NOT DETECTED
Benzodiazepines: NOT DETECTED
Cocaine: NOT DETECTED
Opiates: NOT DETECTED
Tetrahydrocannabinol: POSITIVE — AB

## 2023-12-23 LAB — ETHANOL: Alcohol, Ethyl (B): 212 mg/dL — ABNORMAL HIGH (ref <15)

## 2023-12-23 LAB — PROTIME-INR
INR: 0.9 (ref 0.8–1.2)
Prothrombin Time: 12.9 s (ref 11.4–15.2)

## 2023-12-23 LAB — I-STAT CG4 LACTIC ACID, ED: Lactic Acid, Venous: 2.9 mmol/L (ref 0.5–1.9)

## 2023-12-23 LAB — SAMPLE TO BLOOD BANK

## 2023-12-23 MED ORDER — ACETAMINOPHEN 500 MG PO TABS: 1000.0000 mg | ORAL_TABLET | Freq: Four times a day (QID) | ORAL | Status: DC

## 2023-12-23 MED ORDER — ONDANSETRON 4 MG PO TBDP: 4.0000 mg | ORAL_TABLET | Freq: Four times a day (QID) | ORAL | Status: DC | PRN

## 2023-12-23 MED ORDER — OXYCODONE HCL 5 MG PO TABS
5.0000 mg | ORAL_TABLET | ORAL | Status: DC | PRN
  Administered 2023-12-24 – 2023-12-26 (×5): 5 mg via ORAL
  Filled 2023-12-23 (×6): qty 1

## 2023-12-23 MED ORDER — OXYCODONE HCL 5 MG PO TABS: 5.0000 mg | ORAL_TABLET | ORAL | Status: DC | PRN

## 2023-12-23 MED ORDER — POLYETHYLENE GLYCOL 3350 17 G PO PACK: 17.0000 g | PACK | Freq: Every day | ORAL | Status: DC | PRN

## 2023-12-23 MED ORDER — LACTATED RINGERS IV BOLUS
1000.0000 mL | Freq: Once | INTRAVENOUS | Status: AC
Start: 2023-12-23 — End: 2023-12-23
  Administered 2023-12-23: 1000 mL via INTRAVENOUS

## 2023-12-23 MED ORDER — METHOCARBAMOL 1000 MG/10ML IJ SOLN: 500.0000 mg | Freq: Three times a day (TID) | INTRAMUSCULAR | Status: DC

## 2023-12-23 MED ORDER — METOPROLOL TARTRATE 5 MG/5ML IV SOLN: 5.0000 mg | Freq: Four times a day (QID) | INTRAVENOUS | Status: DC | PRN

## 2023-12-23 MED ORDER — DOCUSATE SODIUM 100 MG PO CAPS: 100.0000 mg | ORAL_CAPSULE | Freq: Two times a day (BID) | ORAL | Status: DC

## 2023-12-23 MED ORDER — ONDANSETRON HCL 4 MG/2ML IJ SOLN
4.0000 mg | Freq: Four times a day (QID) | INTRAMUSCULAR | Status: DC | PRN
  Administered 2023-12-24 – 2023-12-26 (×2): 4 mg via INTRAVENOUS
  Filled 2023-12-23 (×2): qty 2

## 2023-12-23 MED ORDER — METHOCARBAMOL 500 MG PO TABS: 500.0000 mg | ORAL_TABLET | Freq: Three times a day (TID) | ORAL | Status: DC

## 2023-12-23 MED ORDER — IOHEXOL 350 MG/ML SOLN
75.0000 mL | Freq: Once | INTRAVENOUS | Status: AC | PRN
  Administered 2023-12-23: 75 mL via INTRAVENOUS

## 2023-12-23 MED ORDER — METHOCARBAMOL 500 MG PO TABS
500.0000 mg | ORAL_TABLET | Freq: Three times a day (TID) | ORAL | Status: DC
  Administered 2023-12-24 (×2): 500 mg via ORAL
  Filled 2023-12-23 (×2): qty 1

## 2023-12-23 MED ORDER — METHOCARBAMOL 1000 MG/10ML IJ SOLN
500.0000 mg | Freq: Three times a day (TID) | INTRAMUSCULAR | Status: DC
Start: 2023-12-23 — End: 2023-12-26

## 2023-12-23 MED ORDER — OXYCODONE HCL 5 MG PO TABS: 10.0000 mg | ORAL_TABLET | ORAL | Status: DC | PRN

## 2023-12-23 MED ORDER — DOCUSATE SODIUM 100 MG PO CAPS
100.0000 mg | ORAL_CAPSULE | Freq: Two times a day (BID) | ORAL | Status: DC
  Administered 2023-12-24 – 2023-12-26 (×4): 100 mg via ORAL
  Filled 2023-12-23 (×5): qty 1

## 2023-12-23 MED ORDER — LACTATED RINGERS IV SOLN: INTRAVENOUS | Status: DC

## 2023-12-23 MED ORDER — HYDROMORPHONE HCL 1 MG/ML IJ SOLN
0.5000 mg | INTRAMUSCULAR | Status: DC | PRN
  Administered 2023-12-25: 0.5 mg via INTRAVENOUS
  Filled 2023-12-23: qty 0.5

## 2023-12-23 MED ORDER — TETANUS-DIPHTH-ACELL PERTUSSIS 5-2.5-18.5 LF-MCG/0.5 IM SUSY
0.5000 mL | PREFILLED_SYRINGE | Freq: Once | INTRAMUSCULAR | Status: AC
  Administered 2023-12-23: 0.5 mL via INTRAMUSCULAR

## 2023-12-23 MED ORDER — OXYCODONE HCL 5 MG PO TABS
10.0000 mg | ORAL_TABLET | ORAL | Status: DC | PRN
  Administered 2023-12-25: 10 mg via ORAL
  Filled 2023-12-23 (×2): qty 2

## 2023-12-23 MED ORDER — HYDROMORPHONE HCL 1 MG/ML IJ SOLN: 1.0000 mg | INTRAMUSCULAR | Status: DC | PRN

## 2023-12-23 MED ORDER — ACETAMINOPHEN 500 MG PO TABS
1000.0000 mg | ORAL_TABLET | Freq: Four times a day (QID) | ORAL | Status: DC
  Administered 2023-12-24 – 2023-12-26 (×10): 1000 mg via ORAL
  Filled 2023-12-23 (×10): qty 2

## 2023-12-23 MED ORDER — LORAZEPAM 2 MG/ML IJ SOLN
1.0000 mg | Freq: Once | INTRAMUSCULAR | Status: AC
  Administered 2023-12-23: 1 mg via INTRAVENOUS
  Filled 2023-12-23: qty 1

## 2023-12-23 MED ORDER — HYDRALAZINE HCL 20 MG/ML IJ SOLN: 10.0000 mg | INTRAMUSCULAR | Status: DC | PRN
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
## 2023-12-23 NOTE — ED Notes (Signed)
 Pt keeps removing c collar and refuses to reapply.

## 2023-12-23 NOTE — ED Notes (Signed)
 CCMD called.

## 2023-12-23 NOTE — ED Notes (Signed)
 Pt keeps removing c collar, This nurse reapplied and gave education.

## 2023-12-23 NOTE — ED Notes (Signed)
Head of bed elevated.
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
## 2023-12-23 NOTE — ED Notes (Signed)
 FAST exam negative [place], [place] Franklyn, MD
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
## 2023-12-23 NOTE — ED Notes (Signed)
 4N  nurse given report, pt en route to floor.

## 2023-12-24 ENCOUNTER — Inpatient Hospital Stay (HOSPITAL_COMMUNITY)

## 2023-12-24 LAB — BASIC METABOLIC PANEL WITH GFR
Anion gap: 15 (ref 5–15)
BUN: 10 mg/dL (ref 6–20)
CO2: 19 mmol/L — ABNORMAL LOW (ref 22–32)
Calcium: 8.5 mg/dL — ABNORMAL LOW (ref 8.9–10.3)
Chloride: 101 mmol/L (ref 98–111)
Creatinine, Ser: 0.69 mg/dL (ref 0.44–1.00)
GFR, Estimated: >60 mL/min (ref >60)
Glucose, Bld: 127 mg/dL — ABNORMAL HIGH (ref 70–99)
Potassium: 3.2 mmol/L — ABNORMAL LOW (ref 3.5–5.1)
Sodium: 135 mmol/L (ref 135–145)

## 2023-12-24 LAB — CBC
HCT: 38.6 % (ref 36.0–46.0)
Hemoglobin: 13.3 g/dL (ref 12.0–15.0)
MCH: 30 pg (ref 26.0–34.0)
MCHC: 34.5 g/dL (ref 30.0–36.0)
MCV: 86.9 fL (ref 80.0–100.0)
Platelets: 237 K/uL (ref 150–400)
RBC: 4.44 MIL/uL (ref 3.87–5.11)
RDW: 12.9 % (ref 11.5–15.5)
WBC: 20.6 K/uL — ABNORMAL HIGH (ref 4.0–10.5)
nRBC: 0 % (ref 0.0–0.2)

## 2023-12-24 LAB — HIV ANTIBODY (ROUTINE TESTING W REFLEX): HIV Screen 4th Generation wRfx: NONREACTIVE

## 2023-12-24 LAB — GLUCOSE, CAPILLARY
Glucose-Capillary: 106 mg/dL — ABNORMAL HIGH (ref 70–99)
Glucose-Capillary: 103 mg/dL — ABNORMAL HIGH (ref 70–99)

## 2023-12-24 LAB — MRSA NEXT GEN BY PCR, NASAL: MRSA by PCR Next Gen: NOT DETECTED

## 2023-12-24 MED ORDER — LORAZEPAM 1 MG PO TABS
1.0000 mg | ORAL_TABLET | ORAL | Status: DC | PRN
  Administered 2023-12-24: 1 mg via ORAL

## 2023-12-24 MED ORDER — SERTRALINE HCL 100 MG PO TABS
100.0000 mg | ORAL_TABLET | Freq: Every day | ORAL | Status: DC
  Administered 2023-12-25 – 2023-12-26 (×2): 100 mg via ORAL
  Filled 2023-12-24: qty 2
  Filled 2023-12-24: qty 1

## 2023-12-24 MED ORDER — LORAZEPAM 2 MG/ML IJ SOLN: 1.0000 mg | INTRAMUSCULAR | Status: DC | PRN

## 2023-12-24 MED ORDER — METHOCARBAMOL 750 MG PO TABS
750.0000 mg | ORAL_TABLET | Freq: Three times a day (TID) | ORAL | Status: AC
  Administered 2023-12-24 – 2023-12-26 (×7): 750 mg via ORAL
  Filled 2023-12-24 (×3): qty 1
  Filled 2023-12-24 (×4): qty 2

## 2023-12-24 MED ORDER — POTASSIUM CHLORIDE 20 MEQ PO PACK
40.0000 meq | PACK | Freq: Once | ORAL | Status: AC
Start: 2023-12-24 — End: 2023-12-24
  Administered 2023-12-24: 40 meq via ORAL
  Filled 2023-12-24: qty 2

## 2023-12-24 MED ORDER — LORAZEPAM 1 MG PO TABS
0.0000 mg | ORAL_TABLET | Freq: Four times a day (QID) | ORAL | Status: AC
  Filled 2023-12-24: qty 1

## 2023-12-24 MED ORDER — FOLIC ACID 1 MG PO TABS
1.0000 mg | ORAL_TABLET | Freq: Every day | ORAL | Status: DC
  Administered 2023-12-24 – 2023-12-26 (×3): 1 mg via ORAL
  Filled 2023-12-24 (×3): qty 1

## 2023-12-24 MED ORDER — THIAMINE MONONITRATE 100 MG PO TABS
100.0000 mg | ORAL_TABLET | Freq: Every day | ORAL | Status: DC
  Administered 2023-12-24 – 2023-12-26 (×3): 100 mg via ORAL
  Filled 2023-12-24 (×3): qty 1

## 2023-12-24 MED ORDER — HYDRALAZINE HCL 20 MG/ML IJ SOLN: 10.0000 mg | INTRAMUSCULAR | Status: DC | PRN

## 2023-12-24 MED ORDER — THIAMINE HCL 100 MG/ML IJ SOLN: 100.0000 mg | Freq: Every day | INTRAMUSCULAR | Status: DC

## 2023-12-24 MED ORDER — LORAZEPAM 1 MG PO TABS: 0.0000 mg | ORAL_TABLET | Freq: Two times a day (BID) | ORAL | Status: DC

## 2023-12-24 MED ORDER — ADULT MULTIVITAMIN W/MINERALS CH
1.0000 | ORAL_TABLET | Freq: Every day | ORAL | Status: DC
  Administered 2023-12-24 – 2023-12-26 (×3): 1 via ORAL
  Filled 2023-12-24 (×3): qty 1

## 2023-12-24 MED ORDER — ORAL CARE MOUTH RINSE: 15.0000 mL | OROMUCOSAL | Status: DC | PRN

## 2023-12-24 MED ORDER — CHLORHEXIDINE GLUCONATE CLOTH 2 % EX PADS
6.0000 | MEDICATED_PAD | Freq: Every day | CUTANEOUS | Status: DC
Start: 2023-12-24 — End: 2023-12-26
  Administered 2023-12-24 – 2023-12-26 (×3): 6 via TOPICAL

## 2023-12-24 MED ORDER — SODIUM CHLORIDE 0.9 % IV SOLN: INTRAVENOUS | Status: AC
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
## 2023-12-24 NOTE — Progress Notes (Signed)
 Pt's mother updated via phone per pt request. Now at bedside.

## 2023-12-24 NOTE — Plan of Care (Signed)
  Problem: Education: Goal: Knowledge of General Education information will improve Description: Including pain rating scale, medication(s)/side effects and non-pharmacologic comfort measures Outcome: Progressing   Problem: Clinical Measurements: Goal: Ability to maintain clinical measurements within normal limits will improve Outcome: Progressing Goal: Respiratory complications will improve Outcome: Progressing Goal: Cardiovascular complication will be avoided Outcome: Progressing   Problem: Coping: Goal: Level of anxiety will decrease Outcome: Progressing   Problem: Elimination: Goal: Will not experience complications related to urinary retention Outcome: Progressing   Problem: Pain Managment: Goal: General experience of comfort will improve and/or be controlled Outcome: Progressing   Problem: Safety: Goal: Ability to remain free from injury will improve Outcome: Progressing   Problem: Skin Integrity: Goal: Risk for impaired skin integrity will decrease Outcome: Progressing

## 2023-12-24 NOTE — Plan of Care (Signed)

## 2023-12-24 NOTE — Progress Notes (Signed)
  Inpatient Rehab Admissions Coordinator :  Per therapy recommendations, patient was screened for CIR candidacy by Ottie Glazier RN MSN.  At this time patient appears to be a potential candidate for CIR. I will place a rehab consult per protocol for full assessment. Please call me with any questions.  Ottie Glazier RN MSN Admissions Coordinator [phone]
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
## 2023-12-24 NOTE — Progress Notes (Signed)
 Patient called out regarding blood on finger after touching right ear. During assessment, dried blood was noted on and in right ear. No active bleeding noted. Will continue to monitor for duration of shift.
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
## 2023-12-25 ENCOUNTER — Inpatient Hospital Stay (HOSPITAL_COMMUNITY)

## 2023-12-25 LAB — CBC
HCT: 36.9 % (ref 36.0–46.0)
Hemoglobin: 12.4 g/dL (ref 12.0–15.0)
MCH: 30.3 pg (ref 26.0–34.0)
MCHC: 33.6 g/dL (ref 30.0–36.0)
MCV: 90.2 fL (ref 80.0–100.0)
Platelets: 212 K/uL (ref 150–400)
RBC: 4.09 MIL/uL (ref 3.87–5.11)
RDW: 13.2 % (ref 11.5–15.5)
WBC: 10.9 K/uL — ABNORMAL HIGH (ref 4.0–10.5)
nRBC: 0 % (ref 0.0–0.2)

## 2023-12-25 LAB — BASIC METABOLIC PANEL WITH GFR
Anion gap: 10 (ref 5–15)
BUN: 8 mg/dL (ref 6–20)
CO2: 26 mmol/L (ref 22–32)
Calcium: 8.3 mg/dL — ABNORMAL LOW (ref 8.9–10.3)
Chloride: 103 mmol/L (ref 98–111)
Creatinine, Ser: 0.69 mg/dL (ref 0.44–1.00)
GFR, Estimated: >60 mL/min (ref >60)
Glucose, Bld: 93 mg/dL (ref 70–99)
Potassium: 3.4 mmol/L — ABNORMAL LOW (ref 3.5–5.1)
Sodium: 139 mmol/L (ref 135–145)

## 2023-12-25 LAB — GLUCOSE, CAPILLARY
Glucose-Capillary: 96 mg/dL (ref 70–99)
Glucose-Capillary: 91 mg/dL (ref 70–99)

## 2023-12-25 MED ORDER — GUAIFENESIN ER 600 MG PO TB12
600.0000 mg | ORAL_TABLET | Freq: Two times a day (BID) | ORAL | Status: DC
  Administered 2023-12-25 – 2023-12-26 (×3): 600 mg via ORAL
  Filled 2023-12-25 (×4): qty 1

## 2023-12-25 MED ORDER — LEVOTHYROXINE SODIUM 100 MCG PO TABS
100.0000 ug | ORAL_TABLET | Freq: Every day | ORAL | Status: DC
  Administered 2023-12-25 – 2023-12-26 (×2): 100 ug via ORAL
  Filled 2023-12-25 (×2): qty 1

## 2023-12-25 MED ORDER — POLYVINYL ALCOHOL 1.4 % OP SOLN
1.0000 [drp] | OPHTHALMIC | Status: DC | PRN
  Filled 2023-12-25: qty 15

## 2023-12-25 NOTE — Progress Notes (Addendum)
   12/25/23 1700  Vitals  Temp 98.8 F (37.1 C)  Temp Source Oral  BP (!) 149/94  MAP (mmHg) 110  BP Location Right Arm  BP Method Automatic  Patient Position (if appropriate) Sitting  Pulse Rate Source Monitor  Resp 16  Level of Consciousness  Level of Consciousness Alert  Oxygen Therapy  SpO2 99 %  O2 Device Room Air   Pt transferred from 4N ICU, A&O x 4, pain 4/10 headache, oriented to unit. Pt ambulated to chair. Family at bedside.
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
## 2023-12-26 ENCOUNTER — Other Ambulatory Visit (HOSPITAL_COMMUNITY): Payer: Self-pay

## 2023-12-26 DIAGNOSIS — S06309D Unspecified focal traumatic brain injury with loss of consciousness of unspecified duration, subsequent encounter: Secondary | ICD-10-CM | POA: Diagnosis not present

## 2023-12-26 DIAGNOSIS — F0781 Postconcussional syndrome: Secondary | ICD-10-CM

## 2023-12-26 LAB — BASIC METABOLIC PANEL WITH GFR
Anion gap: 11 (ref 5–15)
BUN: 7 mg/dL (ref 6–20)
CO2: 23 mmol/L (ref 22–32)
Calcium: 8.2 mg/dL — ABNORMAL LOW (ref 8.9–10.3)
Chloride: 104 mmol/L (ref 98–111)
Creatinine, Ser: 0.61 mg/dL (ref 0.44–1.00)
GFR, Estimated: >60 mL/min (ref >60)
Glucose, Bld: 90 mg/dL (ref 70–99)
Potassium: 3.4 mmol/L — ABNORMAL LOW (ref 3.5–5.1)
Sodium: 138 mmol/L (ref 135–145)

## 2023-12-26 LAB — CBC
HCT: 35.7 % — ABNORMAL LOW (ref 36.0–46.0)
Hemoglobin: 11.8 g/dL — ABNORMAL LOW (ref 12.0–15.0)
MCH: 29.9 pg (ref 26.0–34.0)
MCHC: 33.1 g/dL (ref 30.0–36.0)
MCV: 90.4 fL (ref 80.0–100.0)
Platelets: 219 K/uL (ref 150–400)
RBC: 3.95 MIL/uL (ref 3.87–5.11)
RDW: 13.2 % (ref 11.5–15.5)
WBC: 9.9 K/uL (ref 4.0–10.5)
nRBC: 0 % (ref 0.0–0.2)

## 2023-12-26 LAB — GLUCOSE, CAPILLARY
Glucose-Capillary: 95 mg/dL (ref 70–99)
Glucose-Capillary: 84 mg/dL (ref 70–99)

## 2023-12-26 MED ORDER — THIAMINE HCL 100 MG PO TABS
100.0000 mg | ORAL_TABLET | Freq: Every day | ORAL | 0 refills | Status: AC
  Filled 2023-12-26: qty 90, 90d supply, fill #0

## 2023-12-26 MED ORDER — FOLIC ACID 1 MG PO TABS
1.0000 mg | ORAL_TABLET | Freq: Every day | ORAL | 0 refills | Status: DC
  Filled 2023-12-26: qty 30, 30d supply, fill #0

## 2023-12-26 MED ORDER — GUAIFENESIN ER 600 MG PO TB12: 600.0000 mg | ORAL_TABLET | Freq: Two times a day (BID) | ORAL | Status: AC | PRN

## 2023-12-26 MED ORDER — DOCUSATE SODIUM 100 MG PO CAPS: 100.0000 mg | ORAL_CAPSULE | Freq: Two times a day (BID) | ORAL | Status: DC | PRN

## 2023-12-26 MED ORDER — ADULT MULTIVITAMIN W/MINERALS CH
1.0000 | ORAL_TABLET | Freq: Every day | ORAL | 1 refills | Status: AC
  Filled 2023-12-26: qty 90, 90d supply, fill #0

## 2023-12-26 MED ORDER — POLYETHYLENE GLYCOL 3350 17 G PO PACK: 17.0000 g | PACK | Freq: Every day | ORAL | Status: AC | PRN

## 2023-12-26 MED ORDER — POLYVINYL ALCOHOL 1.4 % OP SOLN
1.0000 [drp] | OPHTHALMIC | 0 refills | Status: DC | PRN
  Filled 2023-12-26: qty 15, fill #0

## 2023-12-26 MED ORDER — ACETAMINOPHEN 500 MG PO TABS: 1000.0000 mg | ORAL_TABLET | Freq: Three times a day (TID) | ORAL | Status: DC | PRN

## 2023-12-26 MED ORDER — OXYCODONE HCL 5 MG PO TABS
5.0000 mg | ORAL_TABLET | Freq: Four times a day (QID) | ORAL | 0 refills | Status: AC | PRN
  Filled 2023-12-26: qty 25, 3d supply, fill #0
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
## 2023-12-26 NOTE — TOC Transition Note (Addendum)
 Transition of Care [facility]) - Discharge Note   Patient Details  Name: Sherry Schmidt MRN: [id number] Date of Birth: 11-12-1995  [facility]) CM/SW Contact:  Kandy Towery M, RN Phone Number: 12/26/2023, 12:14 PM   Clinical Narrative:    Patient for likely discharge later today with roommate and family to assist with needed care. PT/OT and ST recommending CIR, but patient prefers home discharge with OP therapies.  [facility]; brother states OP therapy should probably be scheduled in [place] as patient lives in [place].  Referral to [facility] for OP PT/OT and ST f/u.    Final next level of care: OP Rehab Barriers to Discharge: Barriers Resolved   Patient Goals and CMS Choice Patient states their goals for this hospitalization and ongoing recovery are:: to go home CMS Medicare.gov Compare Post Acute Care list provided to:: Patient Choice offered to / list presented to : Patient [place] ownership interest in [facility].provided to:: Patient                          Discharge Plan and Services Additional resources added to the After Visit Summary for     Discharge Planning Services: CM Consult  DME agency: [facility]         DME Arranged: Vannie rolling, Bedside commode   Date DME Agency Contacted: 12/26/23 Time DME Agency Contacted: 1214 Representative spoke with at DME Agency: [place]            Social Drivers of Health (SDOH) Interventions SDOH Screenings   Food Insecurity: No Food Insecurity (12/24/2023)  Housing: Low Risk  (12/24/2023)  Transportation Needs: No Transportation Needs (12/24/2023)  Utilities: Not At Risk (12/24/2023)  Depression (PHQ2-9): High Risk (11/25/2023)  Tobacco Use: Low Risk  (12/23/2023)   No SDOH interventions needed.    Readmission Risk Interventions     No data to display          Mliss MICAEL Fass, RN, BSN  Trauma/Neuro ICU Case  Manager [phone]

## 2023-12-26 NOTE — Discharge Instructions (Addendum)
 In a time of Crisis: [facility]  Mobile Crisis Management provides immediate crisis response, 24/7.  Call [phone]  [facility] Services for MH/DD/SA [facility] is available 24 hours a day, 7 days a week. Customer Service Specialists will assist you to find a crisis provider that is well-matched with your needs. Your local number is: [phone]  [facility]al [facility] Ur[facility] Care (BHUC) IOP, individual counseling, medication management [address] [place], [place] [address] [phone] Call for intake hours; Medicaid and Uninsured    Substance Use Outpatient Providers  [facility] ([facility]) Group and individual counseling. [address]  [place], [place] [address] [phone] [place]: [phone]  [place]: [phone] Medicaid and uninsured.   [facility] Offers IOP groups multiple times per week. [address] [place], [place] [address] [phone] Takes Medicaid and other insurances.   [facility]  Chemical Dependency Intensive Outpatient Program (IOP) [address] [place], [place] [address] [phone] Takes Nurse, learning disability and [place].   [facility]  IOP and Partial Hospitalization Program  [address]  [place], [place] [address] [phone] Private Insurance, [place] only for partial hospitalization  [facility] [address], [address], [place], [place] [address] [phone] Monday-Friday. Short and Long term options.  [facility] Care (BHUC) IOP, individual counseling, medication management [address] [place], [place] [address] [phone] Medicaid and [facility]  [facility] [address]  [place], [place] [address] [phone] Private Insurance and Self Pay   [facility] [address]  [place], [place] [address] [phone] Private Insurance, [place], and Self Pay   [facility]  [address] [place], [place] [address] [facility]  [address]  [place], [place] [address] [phone]  [facility]  [address] [place], [place] [address] [phone]      Residential Treatment Programs  [facility] ([facility] [address] [place], [place] [address] [phone] or [phone] Detox and Residential Rehab 21 days (Medicaid, private insurance, and self pay. If Medicare, will look into funding). No methadone. Call for pre-screen.   [facility]) [address]  [place], [place] [address] [phone] Detox 3-7 days (self Pay and Medicaid Limited availability). Transitional Program for females needs 60 days clean first.  Rehab Only for Males (Medicare, Medicaid, and Self Pay)-No methadone.  [facility] [address] [place], [place] [address] [phone] or [phone] Private Insurance only  [facility] PHONE: [phone] FAX: [phone] Residential program for women 21 and over for up to a year through a Christian 12-step recovery model. Self-pay.    [facility] [address] [place], [place] [address] Phone:  [phone] Must be detoxed 72 hours prior to admission; 28 day program.  Self-pay.  [facility] [address]  [place], [place] [phone] [employer], Medicare, [place] (not straight [place]). They offer assistance with transportation.   [facility] [address] [place],  [place], [place] [address] [phone] Christian Based Program. Men only. No insurance  [employer] is a substance use disorder treatment program for women, including those who are pregnant, parenting, and/or whose lives have been touched by abuse and violence. (800)  [phone]  [facility] [address]  [place], [place] [address] Women's: [phone] Men's: [phone] No Medicaid.   [facility] Locations across the [place] (mainly [place] ) willing to help with transportation.  [phone] [employer]. [facility]  [address] [place].  [place], [place] [address] [phone] Treatment Only, must make assessment appointment, and must be sober for assessment appointment. Self pay, [facility], must be [facility] resident. No methadone.   [facility]  [address] [place], [place] [address] [phone] No pending legal charges, Long-term work program. No methadone. Call for assessment.  [facility]  [address] [place], [place] [address] [phone] or [phone] Commercial Insurance Only  [facility] - [phone] Private Insurance (no [place]). Males/Females, call to make referrals, multiple facilities.   [facility]: [facility] [address] [place], [place] [address] [phone]  [facility]-no methadone: [facility] [address] [place], [place] [address] [phone] darien [facility] [place] [facility] [phone] [place], [place] For women, houses 8 residents for sober living. No Medicaid.         AA Meetings Website to locate meetings (virtually or in person): [email or web address].young.biz/ Phone: [phone]  Syringe Services Program: Due to COVID-19, syringe services programs are likely operating under different hours with limited or no fixed site hours. Some programs may not be operating at all. Please contact the program directly using the phone numbers provided below to see if they are still operating under COVID-19. [facility] Solution to the Opioid Problem (GCSTOP) Fixed;  mobile; peer-based;Norman Cummins) [phone] [email or web address] Fixed site exchange at [facility], [address] [place], [place] [address] on Wednesdays (2:00 - 5:00 pm) and Thursdays (4:00 - 8:00 pm). Pop-up mobile exchange locations: [employer], [address] [place], [place] [address] on Tuesdays (11:00 am - 1:00 pm) and Fridays (11:00 am - 1:00 pm) -[facility] - [address], [place], [place] [address] on Tuesdays (2:00 - 4:00 pm) and Fridays (2:00 - 4:00 pm) -[facility] copy - also serves Radio broadcast assistant and [employer] [place] [employer];Fixed; mobile; peer-based; Velia Specking [phone] [email or web address] [address], [place], [place] [address] Delivery and outreach available in [place] and [place], please call for more information. Monday, Tuesday: 1:00 -7:00 pm, Thursday: 4:00 pm - 8:00 pm, Friday: 1:00 pm - 8:00 pm)  Medication-Assisted Treatment (MAT):  -[facility]- services [address] and surrounding areas including [place], [place], [place], [place], [address], [place], [place], [place], [place], and [place], [place]. Options include Methadone, buprenorphine or Suboxone. [address] [place], [place] [address] Phone: [phone] Mon - Fri: 5:30am - 2:00pm Sat: 5:30am -7:30am Sun: Closed Holidays: 6:00am - 8:00am  -[facility]- We use FDA-approved medications, like methadone/suboxone/sublocade, and vivitrol. These medications are then combined with customized care plans that include individual or group counseling, toxicology, and medical care directed by on-site physicians. Accepts most insurance plans, Medicaid, and private pay.  [address] [place], [place] [address] Phone: [phone] Monday-Friday 5:00 AM - 10:00 AM Saturday 6:00 AM - 8:30 AM Sunday 6:00 AM - 7:00 AM  -[facility]- [facility] is a treatment & recovery focused  program. In addition to receiving methadone medication, our clients participate in individual and group counseling as well as random drug testing. If accepted into the [facility] Opioid Program, you will be provided several intake appointments and a physical exam  [address] [place], [place] [address] Office: [phone]  Fax: [phone]  -[facility]- We put our community members at the center of everything we do, for remote treatment services as well as in-person, from alcohol  withdrawal to opioid use and more.  [address], [place], [place] [address] [phone] Monday-Wednesday: 9:00am - 5:00pm Thursday: 9:00am - 6:00pm Friday: 9:00am - 5:00pm Saturday: 9:00am - 1:00pm Sunday: Closed  -[facility] [place]) [address], [place], [place] [address] [phone]  [place] [phone] [address] [place], [place] [address]  M-W    5:00am-12:00pm Thu     5:00am-10:00am Fri       5:00am-12:00pm Sat      5:00am-8:00am Sun     Closed  $12/daily for Methadone Treatment.     Please seek medical attention if you experience leaking of water from the ear, stroke-like symptoms, facial droop, arm/leg weakness, lethargy, or new severe headaches.

## 2023-12-26 NOTE — Progress Notes (Signed)
 Inpatient Rehab Admissions Coordinator:    Pt. Was seen by rehab physician who feels that  home with outpatient services is the most appropriate discharge plan for her. CIR will not pursue in admit. This is also patient's preference. We will sign off. Pt. May follow up with physiatrist in [facility].   Leita Kleine, MS, CCC-SLP Rehab Admissions Coordinator  [phone] (celll) [phone] (office)
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
## 2023-12-26 NOTE — TOC CAGE-AID Note (Signed)
 Transition of Care [facility]) - CAGE-AID Screening   Patient Details  Name: Sherry Schmidt MRN: [id number] Date of Birth: May 24, 1995  [facility]) CM/SW Contact:    Semisi Biela E Esmae Donathan, LCSW Phone Number: 12/26/2023, 8:55 AM   Clinical Narrative: Patient reports alcohol  use, drinks 2-3 Trulys 2-3 times per week. Patient states she plans to quit. Resources added to AVS.   CAGE-AID Screening:    Have You Ever Felt You Ought to Cut Down on Your Drinking or Drug Use?: Yes Have People Annoyed You By Critizing Your Drinking Or Drug Use?: Yes Have You Felt Bad Or Guilty About Your Drinking Or Drug Use?: Yes Have You Ever Had a Drink or Used Drugs First Thing In The Morning to Steady Your Nerves or to Get Rid of a Hangover?: No CAGE-AID Score: 3  Substance Abuse Education Offered: Yes

## 2023-12-26 NOTE — Progress Notes (Signed)
 Order to discharge patient home. Family at bedside. Discharge instructions/AVS given to and reviewed with patient. Education provided as needed. Patient verbalized understanding. 1 PIV removed by this RN. DME rolling walker and bedside commode delivered to patient prior to discharge. Personal belongings and TOC meds sent home with the patient. Home via private vehicle.

## 2023-12-26 NOTE — Plan of Care (Signed)
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
## 2024-01-05 ENCOUNTER — Ambulatory Visit: Payer: PRIVATE HEALTH INSURANCE | Attending: Physician Assistant | Admitting: Occupational Therapy

## 2024-01-05 DIAGNOSIS — M5459 Other low back pain: Secondary | ICD-10-CM | POA: Insufficient documentation

## 2024-01-05 DIAGNOSIS — M25551 Pain in right hip: Secondary | ICD-10-CM | POA: Insufficient documentation

## 2024-01-05 DIAGNOSIS — R41841 Cognitive communication deficit: Secondary | ICD-10-CM | POA: Insufficient documentation

## 2024-01-05 DIAGNOSIS — S06309D Unspecified focal traumatic brain injury with loss of consciousness of unspecified duration, subsequent encounter: Secondary | ICD-10-CM | POA: Insufficient documentation

## 2024-01-05 DIAGNOSIS — R262 Difficulty in walking, not elsewhere classified: Secondary | ICD-10-CM | POA: Insufficient documentation

## 2024-01-05 DIAGNOSIS — R2681 Unsteadiness on feet: Secondary | ICD-10-CM | POA: Insufficient documentation

## 2024-01-05 DIAGNOSIS — S064XAA Epidural hemorrhage with loss of consciousness status unknown, initial encounter: Secondary | ICD-10-CM | POA: Insufficient documentation
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
## 2024-01-19 ENCOUNTER — Encounter: Payer: Self-pay | Admitting: Physical Medicine & Rehabilitation
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
## 2024-01-22 ENCOUNTER — Ambulatory Visit: Payer: PRIVATE HEALTH INSURANCE | Admitting: Speech Pathology

## 2024-01-22 ENCOUNTER — Encounter: Payer: Self-pay | Admitting: Speech Pathology

## 2024-01-22 ENCOUNTER — Ambulatory Visit: Payer: PRIVATE HEALTH INSURANCE | Admitting: Physical Therapy

## 2024-01-22 ENCOUNTER — Encounter: Payer: Self-pay | Admitting: Physical Therapy

## 2024-01-22 DIAGNOSIS — M25551 Pain in right hip: Secondary | ICD-10-CM

## 2024-01-22 DIAGNOSIS — R262 Difficulty in walking, not elsewhere classified: Secondary | ICD-10-CM

## 2024-01-22 DIAGNOSIS — R2681 Unsteadiness on feet: Secondary | ICD-10-CM

## 2024-01-22 DIAGNOSIS — M5459 Other low back pain: Secondary | ICD-10-CM

## 2024-01-22 DIAGNOSIS — R41841 Cognitive communication deficit: Secondary | ICD-10-CM

## 2024-01-22 DIAGNOSIS — S06309D Unspecified focal traumatic brain injury with loss of consciousness of unspecified duration, subsequent encounter: Secondary | ICD-10-CM

## 2024-01-22 DIAGNOSIS — S064XAA Epidural hemorrhage with loss of consciousness status unknown, initial encounter: Secondary | ICD-10-CM
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
## 2024-01-26 ENCOUNTER — Ambulatory Visit: Admitting: Family Medicine

## 2024-02-10 ENCOUNTER — Telehealth: Admitting: Physician Assistant

## 2024-02-10 DIAGNOSIS — J069 Acute upper respiratory infection, unspecified: Secondary | ICD-10-CM

## 2024-02-10 MED ORDER — BENZONATATE 100 MG PO CAPS: 100.0000 mg | ORAL_CAPSULE | Freq: Three times a day (TID) | ORAL | 0 refills | Status: AC | PRN

## 2024-02-10 MED ORDER — IPRATROPIUM BROMIDE 0.03 % NA SOLN: 2.0000 | Freq: Two times a day (BID) | NASAL | 0 refills | Status: AC

## 2024-02-10 NOTE — Progress Notes (Signed)
 We are sorry you are not feeling well.  Here is how we plan to help!  Based on what you have shared with me, it looks like you may have a viral upper respiratory infection.  Upper respiratory infections are caused by a large number of viruses; however, rhinovirus is the most common cause.   Symptoms vary from person to person, with common symptoms including sore throat, cough, and fatigue or lack of energy, and a feeling of general discomfort.  A low-grade fever of up to 100.4 may present, but is often uncommon.  Symptoms vary however, and are closely related to a person's age or underlying illnesses.  The most common symptoms associated with an upper respiratory infection are nasal discharge or congestion, cough, sneezing, headache and pressure in the ears and face.  These symptoms usually persist for about 3 to 10 days, but can last up to 2 weeks.  It is important to know that upper respiratory infections do not cause serious illness or complications in most cases.    Upper respiratory infections can be transmitted from person to person, with the most common method of transmission being a person's hands.  The virus is able to live on the skin and can infect other persons for up to 2 hours after direct contact.  Also, these can be transmitted when someone coughs or sneezes; thus, it is important to cover the mouth to reduce this risk.  To keep the spread of the illness at bay, good hand hygiene is very important!  Because this is a viral infection, there are no specific treatments other than to help you with the symptoms until the infection runs its course.    For nasal congestion, you may use an oral decongestants such as Mucinex  D or if you have glaucoma or high blood pressure use plain Mucinex .  Saline nasal spray or nasal drops can help and can safely be used as often as needed for congestion.  For your congestion, I have prescribed Ipratropium Bromide  nasal spray 0.03% two sprays in each nostril 2-3  times a day  If you do not have a history of heart disease, hypertension, diabetes or thyroid  disease, prostate/bladder issues or glaucoma, you may also use Sudafed to treat nasal congestion.  It is highly recommended that you consult with a pharmacist or your primary care physician to ensure this medication is safe for you to take.     If you have a cough, you may use over-the-counter cough suppressants such as Delsym and Robitussin.  If you have glaucoma or high blood pressure, you can also use Coricidin HBP.   For cough I have prescribed for you A prescription cough medication called Tessalon  Perles 100 mg. You may take 1-2 capsules every 8 hours as needed for cough  If you have a sore or scratchy throat, use a saltwater gargle-  to  teaspoon of salt dissolved in a 4-ounce to 8-ounce glass of warm water.  Gargle the solution for approximately 15-30 seconds and then spit.  It is important not to swallow the solution.  You can also use throat lozenges/cough drops and Chloraseptic spray to help with throat pain or discomfort.  Warm or cold liquids can also be helpful in relieving throat pain.   For headache, pain, or general discomfort, you can use Ibuprofen  or Tylenol  as directed.   Some authorities believe that zinc sprays or the use of Echinacea may shorten the course of your symptoms.  I have sent a work note  to your MyChart. You can find by going to the Menu on your homepage, scrolling down to the Communications section, and selecting Letters. Let us  know if you have any issue locating. Take care and feel better soon!    HOME CARE Only take medications as instructed by your medical team. Be sure to drink plenty of fluids. Water is fine as well as fruit juices, sodas and electrolyte beverages. You may want to stay away from caffeine or alcohol . If you are nauseated, try taking small sips of liquids. How do you know if you are getting enough fluid? Your urine should be a pale yellow or almost  colorless. Get rest. Taking a steamy shower or using a humidifier may help nasal congestion and ease sore throat pain. You can place a towel over your head and breathe in the steam from hot water coming from a faucet. Using a saline nasal spray works much the same way. Cough drops, hard candies and sore throat lozenges may ease your cough. Avoid close contacts especially the very young and the elderly Cover your mouth if you cough or sneeze Always remember to wash your hands.   GET HELP RIGHT AWAY IF: You develop worsening fever. If your symptoms do not improve within 10 days You develop yellow or green discharge from your nose over 3 days. You have coughing fits You develop a severe head ache or visual changes. You develop shortness of breath, difficulty breathing or start having chest pain Your symptoms persist after you have completed your treatment plan  MAKE SURE YOU  Understand these instructions. Will watch your condition. Will get help right away if you are not doing well or get worse.  Your e-visit answers were reviewed by a board certified advanced clinical practitioner to complete your personal care plan. Depending upon the condition, your plan could have included both over-the-counter or prescription medications.  Please review your pharmacy choice. If there is a problem, you may call our nursing hot line at and have the prescription routed to another pharmacy. Your safety is important to us . If you have drug allergies, check your prescription carefully.   You can use MyChart to ask questions about today's visit, request a non-urgent call back, or ask for a work or school excuse for 24 hours related to this e-Visit. If it has been greater than 24 hours you will need to follow up with your provider, or enter a new e-Visit to address those concerns. You will get an e-mail in the next two days asking about your experience.  I hope that your e-visit has been valuable and will  speed your recovery. Thank you for using e-visits.   I have spent 5 minutes in review of e-visit questionnaire, review and updating patient chart, medical decision making and response to patient.   Elsie Velma Lunger, PA-C

## 2024-03-12 ENCOUNTER — Telehealth: Admitting: Physician Assistant

## 2024-03-12 DIAGNOSIS — B001 Herpesviral vesicular dermatitis: Secondary | ICD-10-CM | POA: Diagnosis not present

## 2024-03-12 MED ORDER — VALACYCLOVIR HCL 1 G PO TABS: 2000.0000 mg | ORAL_TABLET | Freq: Two times a day (BID) | ORAL | 0 refills | Status: AC

## 2024-03-12 NOTE — Progress Notes (Signed)
 We are sorry that you are not feeling well.  Here is how we plan to help!  Based on what you have shared with me it does look like you have a viral infection.    Most cold sores or fever blisters are small fluid filled blisters around the mouth caused by herpes simplex virus.  The most common strain of the virus causing cold sores is herpes simplex virus 1.  It can be spread by skin contact, sharing eating utensils, or even sharing towels.  Cold sores are contagious to other people until dry. (Approximately 5-7 days).  Wash your hands. You can spread the virus to your eyes through handling your contact lenses after touching the lesions.  Most people experience pain at the sight or tingling sensations in their lips that may begin before the ulcers erupt.  Herpes simplex is treatable but not curable.  It may lie dormant for a long time and then reappear due to stress or prolonged sun exposure.  Many patients have success in treating their cold sores with an over the counter topical called Abreva.  You may apply the cream up to 5 times daily (maximum 10 days) until healing occurs.  If you would like to use an oral antiviral medication to speed the healing of your cold sore, I have sent a prescription to your local pharmacy Valacyclovir  2 gm take one by mouth twice a day for 1 day    A work note has been provided for you today. It will be available under letters in your MyChart account.  HOME CARE:  Wash your hands frequently. Do not pick at or rub the sore. Don't open the blisters. Avoid kissing other people during this time. Avoid sharing drinking glasses, eating utensils, or razors. Do not handle contact lenses unless you have thoroughly washed your hands with soap and warm water! Avoid oral sex during this time.  Herpes from sores on your mouth can spread to your partner's genital area. Avoid contact with anyone who has eczema or a weakened immune system. Cold sores are often triggered by  exposure to intense sunlight, use a lip balm containing a sunscreen (SPF 30 or higher).  GET HELP RIGHT AWAY IF:  Blisters look infected. Blisters occur near or in the eye. Symptoms last longer than 10 days. Your symptoms become worse.  MAKE SURE YOU:  Understand these instructions. Will watch your condition. Will get help right away if you are not doing well or get worse.    Your e-visit answers were reviewed by a board certified advanced clinical practitioner to complete your personal care plan.  Depending upon the condition, your plan could have  Included both over the counter or prescription medications.    Please review your pharmacy choice.  Be sure that the pharmacy you have chosen is open so that you can pick up your prescription now.  If there is a problem you can message your provider in MyChart to have the prescription routed to another pharmacy.    Your safety is important to us .  If you have drug allergies check our prescription carefully.  For the next 24 hours you can use MyChart to ask questions about today's visit, request a non-urgent call back, or ask for a work or school excuse from your e-visit provider.  You will get an email in the next two days asking about your experience.  I hope that your e-visit has been valuable and will speed your recovery.   I have  spent 5 minutes in review of e-visit questionnaire, review and updating patient chart, medical decision making and response to patient.   Delon CHRISTELLA Dickinson, PA-C

## 2024-03-24 ENCOUNTER — Encounter: Attending: Physical Medicine & Rehabilitation | Admitting: Physical Medicine & Rehabilitation

## 2024-03-30 ENCOUNTER — Telehealth: Admitting: Family Medicine

## 2024-03-30 DIAGNOSIS — R110 Nausea: Secondary | ICD-10-CM

## 2024-03-30 MED ORDER — ONDANSETRON HCL 4 MG PO TABS: 4.0000 mg | ORAL_TABLET | Freq: Three times a day (TID) | ORAL | 0 refills | Status: AC | PRN

## 2024-03-30 NOTE — Progress Notes (Signed)

## 2024-03-30 NOTE — Progress Notes (Signed)

## 2024-04-02 ENCOUNTER — Ambulatory Visit: Admitting: Family Medicine

## 2024-04-02 VITALS — BP 101/65 | HR 62 | Temp 98.3°F | Ht 73.0 in | Wt 271.2 lb

## 2024-04-02 DIAGNOSIS — E88819 Insulin resistance, unspecified: Secondary | ICD-10-CM | POA: Diagnosis not present

## 2024-04-02 DIAGNOSIS — F411 Generalized anxiety disorder: Secondary | ICD-10-CM

## 2024-04-02 DIAGNOSIS — G510 Bell's palsy: Secondary | ICD-10-CM | POA: Diagnosis not present

## 2024-04-02 DIAGNOSIS — L659 Nonscarring hair loss, unspecified: Secondary | ICD-10-CM | POA: Diagnosis not present

## 2024-04-02 DIAGNOSIS — F332 Major depressive disorder, recurrent severe without psychotic features: Secondary | ICD-10-CM

## 2024-04-02 DIAGNOSIS — S0219XD Other fracture of base of skull, subsequent encounter for fracture with routine healing: Secondary | ICD-10-CM

## 2024-04-02 DIAGNOSIS — E039 Hypothyroidism, unspecified: Secondary | ICD-10-CM

## 2024-04-02 DIAGNOSIS — Z8679 Personal history of other diseases of the circulatory system: Secondary | ICD-10-CM | POA: Diagnosis not present

## 2024-04-02 DIAGNOSIS — E559 Vitamin D deficiency, unspecified: Secondary | ICD-10-CM | POA: Diagnosis not present

## 2024-04-02 DIAGNOSIS — R45851 Suicidal ideations: Secondary | ICD-10-CM

## 2024-04-02 DIAGNOSIS — F339 Major depressive disorder, recurrent, unspecified: Secondary | ICD-10-CM | POA: Insufficient documentation

## 2024-04-02 DIAGNOSIS — S32009S Unspecified fracture of unspecified lumbar vertebra, sequela: Secondary | ICD-10-CM

## 2024-04-02 DIAGNOSIS — Z79899 Other long term (current) drug therapy: Secondary | ICD-10-CM

## 2024-04-02 MED ORDER — SERTRALINE HCL 100 MG PO TABS: 100.0000 mg | ORAL_TABLET | Freq: Every day | ORAL | 1 refills | Status: AC

## 2024-04-02 MED ORDER — METFORMIN HCL 500 MG PO TABS: 500.0000 mg | ORAL_TABLET | Freq: Two times a day (BID) | ORAL | 1 refills | Status: AC

## 2024-04-02 MED ORDER — LEVOTHYROXINE SODIUM 100 MCG PO TABS: 100.0000 ug | ORAL_TABLET | Freq: Every day | ORAL | 0 refills | Status: AC
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
## 2024-04-05 ENCOUNTER — Encounter: Payer: Self-pay | Admitting: Physician Assistant

## 2024-04-09 ENCOUNTER — Other Ambulatory Visit

## 2024-04-09 ENCOUNTER — Ambulatory Visit: Payer: Self-pay | Admitting: Family Medicine

## 2024-04-09 DIAGNOSIS — Z79899 Other long term (current) drug therapy: Secondary | ICD-10-CM | POA: Diagnosis not present

## 2024-04-09 DIAGNOSIS — F411 Generalized anxiety disorder: Secondary | ICD-10-CM

## 2024-04-09 DIAGNOSIS — F332 Major depressive disorder, recurrent severe without psychotic features: Secondary | ICD-10-CM | POA: Diagnosis not present

## 2024-04-09 DIAGNOSIS — L659 Nonscarring hair loss, unspecified: Secondary | ICD-10-CM

## 2024-04-09 DIAGNOSIS — E039 Hypothyroidism, unspecified: Secondary | ICD-10-CM

## 2024-04-09 DIAGNOSIS — E88819 Insulin resistance, unspecified: Secondary | ICD-10-CM | POA: Diagnosis not present

## 2024-04-09 DIAGNOSIS — R45851 Suicidal ideations: Secondary | ICD-10-CM | POA: Diagnosis not present

## 2024-04-09 DIAGNOSIS — E559 Vitamin D deficiency, unspecified: Secondary | ICD-10-CM | POA: Diagnosis not present

## 2024-04-09 LAB — CBC WITH DIFFERENTIAL/PLATELET
Basophils Absolute: 0.1 K/uL (ref 0.0–0.1)
Basophils Relative: 0.8 % (ref 0.0–3.0)
Eosinophils Absolute: 0.4 K/uL (ref 0.0–0.7)
Eosinophils Relative: 3.5 % (ref 0.0–5.0)
HCT: 42.7 % (ref 36.0–46.0)
Hemoglobin: 14 g/dL (ref 12.0–15.0)
Lymphocytes Relative: 24 % (ref 12.0–46.0)
Lymphs Abs: 2.5 K/uL (ref 0.7–4.0)
MCHC: 32.9 g/dL (ref 30.0–36.0)
MCV: 85.2 fl (ref 78.0–100.0)
Monocytes Absolute: 0.5 K/uL (ref 0.1–1.0)
Monocytes Relative: 4.5 % (ref 3.0–12.0)
Neutro Abs: 7 K/uL (ref 1.4–7.7)
Neutrophils Relative %: 67.2 % (ref 43.0–77.0)
Platelets: 292 K/uL (ref 150.0–400.0)
RBC: 5.01 Mil/uL (ref 3.87–5.11)
RDW: 13.4 % (ref 11.5–15.5)
WBC: 10.4 K/uL (ref 4.0–10.5)

## 2024-04-09 LAB — HEMOGLOBIN A1C: Hgb A1c MFr Bld: 5.4 % (ref 4.6–6.5)

## 2024-04-09 LAB — COMPREHENSIVE METABOLIC PANEL WITH GFR
ALT: 12 U/L (ref 3–35)
AST: 14 U/L (ref 5–37)
Albumin: 4.4 g/dL (ref 3.5–5.2)
Alkaline Phosphatase: 85 U/L (ref 39–117)
BUN: 11 mg/dL (ref 6–23)
CO2: 26 meq/L (ref 19–32)
Calcium: 9.3 mg/dL (ref 8.4–10.5)
Chloride: 105 meq/L (ref 96–112)
Creatinine, Ser: 0.7 mg/dL (ref 0.40–1.20)
GFR: 117.83 mL/min
Glucose, Bld: 90 mg/dL (ref 70–99)
Potassium: 3.7 meq/L (ref 3.5–5.1)
Sodium: 139 meq/L (ref 135–145)
Total Bilirubin: 0.4 mg/dL (ref 0.2–1.2)
Total Protein: 7.6 g/dL (ref 6.0–8.3)

## 2024-04-09 LAB — VITAMIN B12: Vitamin B-12: 451 pg/mL (ref 211–911)

## 2024-04-09 LAB — TSH: TSH: 2.02 u[IU]/mL (ref 0.35–5.50)

## 2024-04-09 LAB — VITAMIN D 25 HYDROXY (VIT D DEFICIENCY, FRACTURES): VITD: 16.83 ng/mL — ABNORMAL LOW (ref 30.00–100.00)

## 2024-04-09 NOTE — Patient Instructions (Signed)
 Continue current medication regimen.   Follow up with specialists as scheduled.   We are checking labs today, will be in contact with any results that require further attention  MRIs have been ordered for your brain and lumbar spine. Someone should be reaching out to get you scheduled for these.  Referral to psychiatry today.   Follow-up with me in 6 mos for medication management, sooner if needed.

## 2024-04-13 ENCOUNTER — Encounter: Payer: Self-pay | Admitting: Family Medicine
